# Patient Record
Sex: Female | Born: 1935
Health system: Southern US, Community
[De-identification: ages and names within clinical notes are randomized; demographics above are authoritative.]

## PROBLEM LIST (undated history)

## (undated) DIAGNOSIS — E7800 Pure hypercholesterolemia, unspecified: Secondary | ICD-10-CM

## (undated) DIAGNOSIS — D150 Benign neoplasm of thymus: Secondary | ICD-10-CM

## (undated) DIAGNOSIS — M1990 Unspecified osteoarthritis, unspecified site: Secondary | ICD-10-CM

## (undated) DIAGNOSIS — N3281 Overactive bladder: Secondary | ICD-10-CM

## (undated) DIAGNOSIS — I10 Essential (primary) hypertension: Secondary | ICD-10-CM

## (undated) DIAGNOSIS — I2510 Atherosclerotic heart disease of native coronary artery without angina pectoris: Principal | ICD-10-CM

## (undated) HISTORY — DX: Benign neoplasm of thymus: D15.0

## (undated) HISTORY — PX: TUBAL LIGATION: SHX77

## (undated) HISTORY — DX: Atherosclerotic heart disease of native coronary artery without angina pectoris: I25.10

## (undated) HISTORY — DX: Pure hypercholesterolemia, unspecified: E78.00

## (undated) HISTORY — PX: EYE SURGERY: SHX253

## (undated) HISTORY — DX: Overactive bladder: N32.81

## (undated) HISTORY — DX: Essential (primary) hypertension: I10

---

## 1984-02-14 HISTORY — PX: CERVICAL LAMINECTOMY: SHX94

## 1998-06-25 ENCOUNTER — Other Ambulatory Visit: Admission: RE | Admit: 1998-06-25 | Discharge: 1998-06-25 | Payer: Self-pay | Admitting: Obstetrics and Gynecology

## 1999-07-15 ENCOUNTER — Other Ambulatory Visit: Admission: RE | Admit: 1999-07-15 | Discharge: 1999-07-15 | Payer: Self-pay | Admitting: Obstetrics and Gynecology

## 2000-10-24 ENCOUNTER — Other Ambulatory Visit: Admission: RE | Admit: 2000-10-24 | Discharge: 2000-10-24 | Payer: Self-pay | Admitting: Obstetrics and Gynecology

## 2002-10-01 ENCOUNTER — Other Ambulatory Visit: Admission: RE | Admit: 2002-10-01 | Discharge: 2002-10-01 | Payer: Self-pay | Admitting: Obstetrics and Gynecology

## 2004-04-20 ENCOUNTER — Encounter: Admission: RE | Admit: 2004-04-20 | Discharge: 2004-04-20 | Payer: Self-pay | Admitting: Obstetrics and Gynecology

## 2005-06-20 ENCOUNTER — Encounter: Admission: RE | Admit: 2005-06-20 | Discharge: 2005-06-20 | Payer: Self-pay | Admitting: Obstetrics and Gynecology

## 2005-11-24 ENCOUNTER — Inpatient Hospital Stay (HOSPITAL_COMMUNITY): Admission: RE | Admit: 2005-11-24 | Discharge: 2005-11-28 | Payer: Self-pay | Admitting: Thoracic Surgery

## 2005-11-24 ENCOUNTER — Encounter (INDEPENDENT_AMBULATORY_CARE_PROVIDER_SITE_OTHER): Payer: Self-pay | Admitting: *Deleted

## 2005-11-24 DIAGNOSIS — D4989 Neoplasm of unspecified behavior of other specified sites: Secondary | ICD-10-CM

## 2005-11-24 HISTORY — PX: OTHER SURGICAL HISTORY: SHX169

## 2005-11-24 HISTORY — DX: Neoplasm of unspecified behavior of other specified sites: D49.89

## 2005-12-05 ENCOUNTER — Encounter: Admission: RE | Admit: 2005-12-05 | Discharge: 2005-12-05 | Payer: Self-pay | Admitting: Thoracic Surgery

## 2005-12-27 ENCOUNTER — Encounter: Admission: RE | Admit: 2005-12-27 | Discharge: 2005-12-27 | Payer: Self-pay | Admitting: Thoracic Surgery

## 2006-02-13 DIAGNOSIS — I2510 Atherosclerotic heart disease of native coronary artery without angina pectoris: Secondary | ICD-10-CM

## 2006-02-13 HISTORY — DX: Atherosclerotic heart disease of native coronary artery without angina pectoris: I25.10

## 2006-02-13 HISTORY — PX: CARDIAC CATHETERIZATION: SHX172

## 2006-02-20 ENCOUNTER — Encounter: Admission: RE | Admit: 2006-02-20 | Discharge: 2006-02-20 | Payer: Self-pay | Admitting: Thoracic Surgery

## 2006-05-30 ENCOUNTER — Ambulatory Visit: Payer: Self-pay | Admitting: Thoracic Surgery

## 2006-05-30 ENCOUNTER — Encounter: Admission: RE | Admit: 2006-05-30 | Discharge: 2006-05-30 | Payer: Self-pay | Admitting: Thoracic Surgery

## 2006-07-10 ENCOUNTER — Encounter: Admission: RE | Admit: 2006-07-10 | Discharge: 2006-07-10 | Payer: Self-pay | Admitting: Obstetrics and Gynecology

## 2006-08-29 ENCOUNTER — Encounter: Admission: RE | Admit: 2006-08-29 | Discharge: 2006-08-29 | Payer: Self-pay | Admitting: Thoracic Surgery

## 2006-08-29 ENCOUNTER — Ambulatory Visit: Payer: Self-pay | Admitting: Thoracic Surgery

## 2007-01-02 ENCOUNTER — Ambulatory Visit: Payer: Self-pay | Admitting: Thoracic Surgery

## 2007-01-02 ENCOUNTER — Encounter: Admission: RE | Admit: 2007-01-02 | Discharge: 2007-01-02 | Payer: Self-pay | Admitting: Thoracic Surgery

## 2007-07-03 ENCOUNTER — Ambulatory Visit: Payer: Self-pay | Admitting: Thoracic Surgery

## 2007-07-03 ENCOUNTER — Encounter: Admission: RE | Admit: 2007-07-03 | Discharge: 2007-07-03 | Payer: Self-pay | Admitting: Thoracic Surgery

## 2007-07-25 ENCOUNTER — Encounter: Admission: RE | Admit: 2007-07-25 | Discharge: 2007-07-25 | Payer: Self-pay | Admitting: Obstetrics and Gynecology

## 2008-01-15 ENCOUNTER — Encounter: Admission: RE | Admit: 2008-01-15 | Discharge: 2008-01-15 | Payer: Self-pay | Admitting: Thoracic Surgery

## 2008-01-15 ENCOUNTER — Ambulatory Visit: Payer: Self-pay | Admitting: Thoracic Surgery

## 2008-07-22 ENCOUNTER — Encounter: Admission: RE | Admit: 2008-07-22 | Discharge: 2008-07-22 | Payer: Self-pay | Admitting: Thoracic Surgery

## 2008-07-22 ENCOUNTER — Ambulatory Visit: Payer: Self-pay | Admitting: Thoracic Surgery

## 2008-10-27 ENCOUNTER — Encounter: Admission: RE | Admit: 2008-10-27 | Discharge: 2008-10-27 | Payer: Self-pay | Admitting: Obstetrics and Gynecology

## 2009-01-26 ENCOUNTER — Ambulatory Visit: Payer: Self-pay | Admitting: Thoracic Surgery

## 2009-01-26 ENCOUNTER — Encounter: Admission: RE | Admit: 2009-01-26 | Discharge: 2009-01-26 | Payer: Self-pay | Admitting: Thoracic Surgery

## 2009-12-17 ENCOUNTER — Encounter: Admission: RE | Admit: 2009-12-17 | Discharge: 2009-12-17 | Payer: Self-pay | Admitting: Obstetrics and Gynecology

## 2009-12-24 ENCOUNTER — Encounter: Admission: RE | Admit: 2009-12-24 | Discharge: 2009-12-24 | Payer: Self-pay | Admitting: Cardiology

## 2010-02-01 ENCOUNTER — Ambulatory Visit: Payer: Self-pay | Admitting: Thoracic Surgery

## 2010-03-05 ENCOUNTER — Encounter: Payer: Self-pay | Admitting: Thoracic Surgery

## 2010-03-06 ENCOUNTER — Encounter: Payer: Self-pay | Admitting: Thoracic Surgery

## 2010-06-28 NOTE — Assessment & Plan Note (Signed)
OFFICE VISIT   Belinda [name], Belinda [name]  DOB:  08/08/1934                                        August 29, 2006  CHART #:  [id number]   [name] came today. Her blood pressure is 142/77, pulse 52,  respirations 18, sats are 98%. She is now approximately 8 months since a  thymectomy with a partial median sternotomy.  Chest x-ray showed no  evidence of recurrence. Her wire is in good position. Lungs are clear to  auscultation and percussion. Sternum was stable. She is doing well  overall. We will see her again in 4 months with a CT scan of the chest,  which will be approximately 1 year since her surgery.   [name], M.D.  Electronically Signed   DPB/MEDQ  D:  08/29/2006  T:  08/30/2006  Job:  [id number]

## 2010-06-28 NOTE — Letter (Signed)
July 22, 2008   Aundra Dubin. Revankar, MD  [address]  [place], [place] [address]   Re:  BELA, BONAPARTE                 DOB:  23-Sep-1934   Dear Dr. Tomie China:   I saw the patient back today.  She is now 2-1/2 years since resection of  her thymoma.  Her blood pressure was 149/85, pulse 64, respirations 18,  and sats were 98%.  Chest x-ray showed no changes.  I will get her back  in 6 months and repeat her CT scan, and refer her back to you for  longterm followup.  I appreciate the opportunity of seeing the patient.   Sincerely,   [name], M.D.  Electronically Signed   DPB/MEDQ  D:  07/22/2008  T:  07/22/2008  Job:  [id number]

## 2010-06-28 NOTE — Assessment & Plan Note (Signed)
OFFICE VISIT   Belinda [name], Belinda [name]  DOB:  Mar 31, 1934                                        Jul 03, 2007  CHART #:  [id number]   HISTORY OF PRESENT ILLNESS:  This is a pleasant 76 year old Caucasian  female who is status post partial mediastinoscopy with thymectomy and  resection of thymic mass by Dr. Edwyna Shell on 11/24/05.  She was last seen  in the office on 01/02/07.  She had had a CAT scan at that time that  showed no evidence of recurrence of her thymoma.  The patient,  currently, is doing well overall.  Her only complaint is occasional pain  and discomfort at the incision site.  She also is fairly anxious about  learning the results of her chest x-ray today.   PHYSICAL EXAMINATION:  GENERAL:  This is a pleasant 76 year old  Caucasian female who is in no acute distress, is alert, oriented and  cooperative.  VITAL SIGNS:  Blood pressure 189/90, pulse rate 50, respirations 18, O2  sat 100% on room air.  CARDIOVASCULAR:  Regular rate and rhythm, S1, S2.  No murmurs, rubs or  gallops.  LUNGS:  Grossly clear to auscultation bilaterally.  No wheezing, rhonchi  or rales.  CHEST:  Incision is clean, dry, well healed.  Sternum is solid.  No  evidence of sternal instability.  Today's chest x-ray showed no evidence  of recurrence of her thymoma.  Lungs somewhat hyperaerated and some  cardiomegaly.   IMPRESSION AND PLAN:  The patient is going to return to Dr. Edwyna Shell in 6  months with a CAT scan of the chest.  She is instructed for any  questions, problems, concerns in the interim.   Doree Fudge, PA   DZ/MEDQ  D:  07/03/2007  T:  07/03/2007  Job:  [id number]   cc:   Aundra Dubin. Revankar, M.D.

## 2010-06-28 NOTE — Assessment & Plan Note (Signed)
OFFICE VISIT   [name], Belinda L  DOB:  24-May-1934                                        January 02, 2007  CHART #:  [id number]   Her blood pressure is 135/76, her pulse is 63 and respirations 18,  saturating 98%.   Lungs clear to auscultation and percussion.  Incision was stable.  CT  scan showed evidence of recurrence of her thymoma.   She is doing well overall and I will see her back again in 6 months with  a chest x-ray. She is over 1 year since her thymectomy.   [name], M.D.  Electronically Signed   DPB/MEDQ  D:  01/02/2007  T:  01/03/2007  Job:  [id number]

## 2010-06-28 NOTE — Assessment & Plan Note (Signed)
OFFICE VISIT   Belinda [name], Belinda [name]  DOB:  1934/07/18                                        February 01, 2010  CHART #:  [id number]   HISTORY:  The patient is a 77 year old female who is status post  resection of a thymic mass on November 24, 2005, by Dr. Edwyna Shell.  This did  turn out to be a thymoma.  Recently, she underwent a CT scan in followup  on December 24, 2009.  Reportedly, there found some benign-appearing  nodules, but the patient said her cardiologist was concerned and that we  should evaluate this study.  She is seen on today's date for that.   PHYSICAL EXAMINATION:  Vital Signs:  Blood pressure 154/80, pulse 62,  respirations 16, and oxygen saturation is 98% on room air.  General  Appearance:  Well-developed adult female, in no acute distress.  Pulmonary:  Clear lungs.  Cardiac:  Regular rate and rhythm.   CT scan was reviewed and it does reveal some benign-appearing right-  sided pulmonary nodules that are stable.  This study was reviewed by Dr.  Edwyna Shell.  There were no other additional findings and no evidence of  recurrence.   ASSESSMENT:  The patient is doing well from our viewpoint.  We will get  a repeat CT scan in 1 year.   Rowe Clack, P.A.-C.   Sherryll Burger  D:  02/01/2010  T:  02/02/2010  Job:  [id number]   cc:   Aundra Dubin. Revankar, M.D.

## 2010-06-28 NOTE — Letter (Signed)
January 26, 2009   Aundra Dubin. Revankar, MD  [address]  [place], [place] [address]   Re:  Belinda [name], Belinda [name]                 DOB:  December 26, 1934   Dear Dr. Tomie China:   I saw the patient back today.  She is now 3 years since resection of her  3.8-cm thymoma.  Chest CT scan shows no evidence of recurrence of her  cancer, and I will see her back again for any long-term follow up.  I  would suggest that she get a CT scan in approximately a year and then  another one in approximately 2 years, but her chances of recurrence are  very small and now over 3 years since her surgery.  Her blood pressure  is 132/75, pulse 60, respirations 18, sats were 98%.  I appreciate the  opportunity of taking care the patient.   Sincerely,   [name], M.D.  Electronically Signed   DPB/MEDQ  D:  01/26/2009  T:  01/27/2009  Job:  [id number]   cc:   Burnell Blanks, MD

## 2010-06-28 NOTE — Letter (Signed)
January 15, 2008   Aundra Dubin. Revankar, MD  [address]  [place], [place] [address]   Re:  Belinda [name], Belinda [name]                 DOB:  11/07/1934   Dear Dr. Tomie China:   I saw the patient in followup, now 2 years since we removed her thymoma.  There is no evidence of recurrence.  She does complain of having some  choking sensation and some trouble swallowing since her surgery, and she  also states that she is unable to sing, but she has no hoarseness.  She  does state that if she is having some symptoms, she will see an ENT  doctor.  The CT scan showed stable changes in the anterior mediastinum  with no evidence of recurrence of her thymoma.  We will see her back  again in 6 months with a chest x-ray.   [name], M.D.  Electronically Signed   DPB/MEDQ  D:  01/15/2008  T:  01/15/2008  Job:  [id number]

## 2010-07-01 NOTE — Op Note (Signed)
Belinda Brown, EMLEY NO.:  [id number]   MEDICAL RECORD NO.:  [id number]          PATIENT TYPE:  INP   LOCATION:  2899                         FACILITY:  [facility]   PHYSICIAN:  [name], M.D. DATE OF BIRTH:  07/16/1934   DATE OF PROCEDURE:  11/24/2005  DATE OF DISCHARGE:                                 OPERATIVE REPORT   PREOPERATIVE DIAGNOSIS:  Thymic mass.   POSTOPERATIVE DIAGNOSIS:  Probable thymoma.   OPERATION PERFORMED:  Partial mediastinoscopy with thymectomy and resection  of thymic mass.   SURGEON:  [name], M.D.   FIRST ASSISTANT:  Jerold Coombe, P.A.-C.   DESCRIPTION OF PROCEDURE:  After percutaneous insertion of all monitoring  lines, the patient underwent general anesthesia.  A 10-cm incision was made  from the sternal notch going down the midline.  The subcutaneous tissue was  divided with electrocautery.  The sternum was split with the sternal saw  down to the manubrium or the angle of Louis, and then Ostene hemostatic  agent was applied, as well as Gelfoam, and a lamina spreader was placed in  the split.  Dissection was started above the innominate vein, dissecting off  the left and right anterior horns of the thymus gland, and dissecting it off  the innominate vein.  One large vessel was doubly clipped and divided.  Then, we dissected down the right side, dissecting the thymus gland, which  appeared to be normal off the right pleura, making 1 small hole in the right  pleura.  We then turned our attention to the left side and dissected off the  pericardium, and then off the left pleura, and found the mass and dissected  the mass out completely and removed the thymus with the thymic mass  completely.  The frozen section revealed this was probably a lymphocyte-  predominant thymoma.  All bleeding was electrocoagulated.  A #28 chest tube  was placed substernally and sutured in place at the xiphoid with 0 silk.  The sternum was  closed with #5 wire in a twisted fashion, #1 Vicryl in the  muscle layer, 2-0 Vicryl in the subcutaneous tissue, and 3-0 Vicryl as a  subcuticular stitch, Dermabond for the skin.  The patient returned to the  recovery room in stable condition.           ______________________________  [name], M.D.     DPB/MEDQ  D:  11/24/2005  T:  11/24/2005  Job:  [id number]   cc:   Dorita Sciara, MD

## 2010-07-01 NOTE — Discharge Summary (Signed)
[name], [name] NO.:  [id number]   MEDICAL RECORD NO.:  [id number]          PATIENT TYPE:  INP   LOCATION:  2032                         FACILITY:  [facility]   PHYSICIAN:  [name], M.D. DATE OF BIRTH:  Jul 27, 1934   DATE OF ADMISSION:  11/24/2005  DATE OF DISCHARGE:                                 DISCHARGE SUMMARY   DATE OF BIRTH:  30-Sep-1934   PHYSICIAN:  Dr. Norton Blizzard   DATE OF ADMISSION:  November 24, 2005   ANTICIPATED DATE OF DISCHARGE:  November 28, 2005   ADMISSION DIAGNOSIS:  Thymic mass   DISCHARGE/SECONDARY DIAGNOSES:  1. Probable myoma, although pathology is still pending at the time of this      dictation, status post thymectomy.  2. Hypercholesterolemia.  3. Hypertension.  4. History of posterior cervical fusion.  5. ALLERGY TO CODEINE AND AMPICILLIN.   PROCEDURES:  November 24, 2005, partial median sternotomy, resection of  thymic mass by Dr. Algis Downs. Karle Plumber.   BRIEF HISTORY:  Belinda Brown is a 77 year old Caucasian female who initially  presented with chest pain and had a Cardiolite scan, which showed increased  uptake in the upper portion of her right chest.  Cardiolite scan was  negative.  She subsequently underwent a CT scan, which revealed a 3.3 x 2.2  cm anterior mediastinal mass consistent with thymoma, or possible lymphoma.  Pulmonary function test showed an FVC of 2.17 with an FEV1 of 1.5.  She  continued to have occasional chest pain and has some mild generalized  weakness, but is otherwise asymptomatic.  She has had a history of  myasthenia gravis.  She was referred to Dr. Edwyna Shell for further evaluation  and treatment of her thymic mass and he recommended sternotomy for  thymectomy.   HOSPITAL COURSE:  Belinda Brown was electively admitted to [facility]  on November 24, 2005, for a thymectomy, as discussed previously.  She was  extubated neurologically intact and transferred to a stepdown unit 3300  postoperatively.  At the time of this dictation, she has had an uneventful  postoperative course.  Her mediastinal tube was discontinued by  postoperative day 2.  Once her tube was out, her morphine PCA was  discontinued and her pain has been controlled on Darvocet and Ultram p.r.n.  She has remained hemodynamically stable with last vital signs showing blood  pressure of 145/75, heart rate in sinus rhythm around 60.  She has been  overall afebrile with intermittent temperatures up to mid 99.  She has been  saturating 95% on room air.  Her latest postoperative chest x-ray has shown  mild bibasilar atelectasis with a tiny left apical pneumothorax.  She has  been voiding without difficulty following removal of Foley catheter and  bowel function is returning.  She has been ambulating without difficulty.  Her incisions appear to be healing well without signs of infection.  Her  labs have also remained stable.  Most recently, on November 26, 2005, showed  a white blood count of 6.6, hemoglobin 10.5, hematocrit 30.3, platelet  count  133, sodium of 132, potassium 4.2, chloride 99, CO2 29, blood glucose 127,  BUN 6, creatinine 0.7, total bilirubin 0.9, alkaline phosphatase 40, AST 50,  ALT 13, total protein 5.1, blood albumin 2.9, calcium 8.4.  Again, her  thymic mass pathology is still pending, but preliminary reports felt that  this was most likely a thymoma rather than lymphoma.  If pathology reports  are known prior to discharge, will discuss oncology followup if appropriate  at that time.   If Belinda Brown continues to make steady progress within the next 24 hours, it  is anticipated that she will be ready for discharge home on postoperative  day 4, November 28, 2005.   DISCHARGE MEDICATIONS:  1. Darvocet-N 100 one to two tablets p.o. q.4 hours p.r.n. pain, maximum 8      tablets per day.  2. Atenolol 25 mg 1 p.o. q. day.  3. NitroQuick 0.4 mg sublingual p.r.n.  4. TriCor 145 mg p.o. daily.   5. Fish oil capsules 1200 mg 2 p.o. daily.  6. Centrum Silver 1 p.o. q. day.  7. Aspirin 81 mg p.o. q. day.   DISCHARGE INSTRUCTIONS:  She is to resume her preoperative diet.  She is to  increase her activity slowly and she is to continue daily walking and  breathing exercises.  She is to avoid driving or heavy lifting for the next  4 weeks.  She may shower and clean her incision gently with soap and water.  She is to notify the [facility] office if she develops fever greater than 101 or  redness or draining from her incision sites.   FOLLOWUP:  She is to follow up with Dr. Edwyna Shell at the [facility] office in 7-10  days with a chest x-ray 1 hour before her appointment at the [facility].      Jerold Coombe, P.A.    ______________________________  [name], M.D.    AWZ/MEDQ  D:  11/27/2005  T:  11/28/2005  Job:  [id number]   cc:   Charline Bills, MD

## 2010-07-01 NOTE — H&P (Signed)
Belinda Brown, DICKERSON NO.:  [id number]   MEDICAL RECORD NO.:  [id number]          PATIENT TYPE:  INP   LOCATION:  NA                           FACILITY:  [facility]   PHYSICIAN:  [name], M.D. DATE OF BIRTH:  1934-08-16   DATE OF ADMISSION:  DATE OF DISCHARGE:                                HISTORY & PHYSICAL   DATE OF ADMISSION:  November 24, 2005   CHIEF COMPLAINT:  Thymic mass.   HISTORY OF PRESENT ILLNESS:  This 76 year old Caucasian female was having a  chest pain and had a Cardiolite scan which showed increased uptake in the  upper portion of her right chest and this was negative.  She had a CT scan  which revealed a 3.3 x 2.2 anterior mediastinal mass consistent with thymoma  or possible lymphoma.  Pulmonary function tests show an FVC of 2.17 with an  FEV1 of 1.5.  She has occasional chest pain but no other symptoms.  No  problems with weakness or other symptoms of possible myasthenia gravis.   PAST MEDICAL HISTORY:  Codeine causes her to be nervous.  She is on atenolol  12.5 daily and TriCor 30 mg daily for hypercholesterolemia and hypertension.   FAMILY HISTORY:  Noncontributory.   SOCIAL HISTORY:  She is married, has one child.  Does not smoke, does not  drink alcohol on a regular basis.   REVIEW OF SYSTEMS:  She is 145 pounds.  She is 5 feet 5 inches. CARDIAC:  See history of present illness.  She gets shortness of breath with exertion.  PULMONARY:  No bronchitis, hemoptysis, asthma, excessive sputum.  GI:  No  nausea, vomiting, constipation, diarrhea or reflux.  GU:  No kidney disease,  dysuria or frequent urination.  VASCULAR:  No claudication, DVT or TIAs.  NEUROLOGICAL:  No headaches, blackouts or seizures.  MUSCULOSKELETAL:  She  has mild joint pain.  PSYCHIATRIC:  No psychiatric illness.  INT:  No change  in her eyesight or hearing.  HEMATOLOGICAL:  No problems with bleeding or  clotting disorders.   PHYSICAL EXAMINATION:  She is a  well-developed Caucasian female in no acute  distress.  Blood pressure is 160/80, pulse 52, respirations 18, sats are  98%.  Head is atraumatic.  EYES:  Pupils equal, reactive to light and accommodation.  Extraocular  movements are normal.  EARS:  Tympanic membranes intact.  NOSE:  No septal deviation.  THROAT:  Without lesion.  NECK:  Supple without thyromegaly.  There are no carotid bruits.  No  supraclavicular or axillary adenopathy.  CHEST:  Clear to auscultation and percussion.  HEART:  Regular sinus rhythm.  No murmurs.  ABDOMEN:  Soft.  There is no hepatosplenomegaly.  Bowel sounds are normal.  EXTREMITIES:  Pulses 2+.  There is no clubbing or edema.  NEUROLOGICAL:  She is oriented x3.  Sensory and motor intact.  Cranial  nerves are intact.  SKIN:  Without lesion.   IMPRESSION:  Thymic mass, rule out thymoma.   PLAN:  Sternotomy with thymectomy.  ______________________________  [name], M.D.     DPB/MEDQ  D:  11/22/2005  T:  11/22/2005  Job:  [id number]

## 2010-12-28 ENCOUNTER — Other Ambulatory Visit: Payer: Self-pay | Admitting: Thoracic Surgery

## 2010-12-28 DIAGNOSIS — D381 Neoplasm of uncertain behavior of trachea, bronchus and lung: Secondary | ICD-10-CM

## 2011-01-13 ENCOUNTER — Encounter: Payer: Self-pay | Admitting: Thoracic Surgery

## 2011-01-13 DIAGNOSIS — E7800 Pure hypercholesterolemia, unspecified: Secondary | ICD-10-CM | POA: Insufficient documentation

## 2011-01-13 DIAGNOSIS — I10 Essential (primary) hypertension: Secondary | ICD-10-CM | POA: Insufficient documentation

## 2011-01-13 DIAGNOSIS — I2510 Atherosclerotic heart disease of native coronary artery without angina pectoris: Secondary | ICD-10-CM | POA: Insufficient documentation

## 2011-01-23 DIAGNOSIS — D150 Benign neoplasm of thymus: Secondary | ICD-10-CM

## 2011-01-23 DIAGNOSIS — D4989 Neoplasm of unspecified behavior of other specified sites: Secondary | ICD-10-CM | POA: Insufficient documentation

## 2011-01-24 ENCOUNTER — Ambulatory Visit (INDEPENDENT_AMBULATORY_CARE_PROVIDER_SITE_OTHER): Payer: Self-pay | Admitting: Thoracic Surgery

## 2011-01-24 ENCOUNTER — Encounter: Payer: Self-pay | Admitting: Thoracic Surgery

## 2011-01-24 ENCOUNTER — Ambulatory Visit
Admission: RE | Admit: 2011-01-24 | Discharge: 2011-01-24 | Disposition: A | Discharge status: Home / Self Care | Payer: PRIVATE HEALTH INSURANCE | Source: Ambulatory Visit | Attending: Thoracic Surgery | Admitting: Thoracic Surgery

## 2011-01-24 VITALS — BP 142/79 | HR 55 | Resp 16 | Ht 64.5 in | Wt 141.0 lb

## 2011-01-24 DIAGNOSIS — C37 Malignant neoplasm of thymus: Secondary | ICD-10-CM

## 2011-01-24 DIAGNOSIS — D381 Neoplasm of uncertain behavior of trachea, bronchus and lung: Secondary | ICD-10-CM

## 2011-01-24 NOTE — Progress Notes (Signed)
HPI patient CT scan shows no evidence of recurrence of her thymoma. She is now 5 years since her surgery. Will release him back to her primary care physician.   Current Outpatient Prescriptions  Medication Sig Dispense Refill  . aspirin 81 MG tablet Take 81 mg by mouth daily.        Marland Kitchen atenolol (TENORMIN) 25 MG tablet Take 12.5 mg by mouth daily.       . Calcium Carbonate-Vitamin D (CALCIUM-VITAMIN D) 500-200 MG-UNIT per tablet Take 1 tablet by mouth 2 (two) times daily with a meal.        . fish oil-omega-3 fatty acids 1000 MG capsule Take 2 g by mouth daily.        . vitamin C (ASCORBIC ACID) 500 MG tablet Take 500 mg by mouth daily.        Marland Kitchen atorvastatin (LIPITOR) 20 MG tablet Take 20 mg by mouth daily.        . Vitamin D, Ergocalciferol, (DRISDOL) 50000 UNITS CAPS Take 50,000 Units by mouth.           Review of Systems: Some mild liver dysfunction followup by primary care physician   Physical Exam lungs are clear attestation percussion see   Diagnostic Tests: CT scan showed no evidence of recurrence of her thymoma   Impression status post resection thymoma 5 years ago:   Plan: Return as needed

## 2011-05-17 ENCOUNTER — Other Ambulatory Visit: Payer: Self-pay | Admitting: Obstetrics and Gynecology

## 2011-05-17 DIAGNOSIS — Z1231 Encounter for screening mammogram for malignant neoplasm of breast: Secondary | ICD-10-CM

## 2011-06-01 ENCOUNTER — Ambulatory Visit
Admission: RE | Admit: 2011-06-01 | Discharge: 2011-06-01 | Disposition: A | Discharge status: Home / Self Care | Payer: Medicare Other | Source: Ambulatory Visit | Attending: Obstetrics and Gynecology | Admitting: Obstetrics and Gynecology

## 2011-06-01 DIAGNOSIS — Z1231 Encounter for screening mammogram for malignant neoplasm of breast: Secondary | ICD-10-CM

## 2013-05-13 ENCOUNTER — Other Ambulatory Visit: Payer: Self-pay | Admitting: Family Medicine

## 2013-05-13 DIAGNOSIS — N644 Mastodynia: Secondary | ICD-10-CM

## 2013-05-21 ENCOUNTER — Ambulatory Visit
Admission: RE | Admit: 2013-05-21 | Discharge: 2013-05-21 | Disposition: A | Discharge status: Home / Self Care | Payer: Medicare Other | Source: Ambulatory Visit

## 2013-05-21 ENCOUNTER — Ambulatory Visit
Admission: RE | Admit: 2013-05-21 | Discharge: 2013-05-21 | Disposition: A | Discharge status: Home / Self Care | Payer: Medicare Other | Source: Ambulatory Visit | Attending: Family Medicine | Admitting: Family Medicine

## 2013-05-21 ENCOUNTER — Other Ambulatory Visit: Payer: Medicare Other

## 2013-05-21 ENCOUNTER — Other Ambulatory Visit: Payer: Self-pay

## 2013-05-21 DIAGNOSIS — Z1231 Encounter for screening mammogram for malignant neoplasm of breast: Secondary | ICD-10-CM

## 2013-05-21 DIAGNOSIS — N644 Mastodynia: Secondary | ICD-10-CM

## 2014-02-13 HISTORY — PX: OTHER SURGICAL HISTORY: SHX169

## 2014-12-29 DIAGNOSIS — Z23 Encounter for immunization: Secondary | ICD-10-CM | POA: Diagnosis not present

## 2015-01-27 DIAGNOSIS — I25118 Atherosclerotic heart disease of native coronary artery with other forms of angina pectoris: Secondary | ICD-10-CM | POA: Diagnosis not present

## 2015-01-27 DIAGNOSIS — I2510 Atherosclerotic heart disease of native coronary artery without angina pectoris: Secondary | ICD-10-CM | POA: Diagnosis not present

## 2015-01-27 DIAGNOSIS — I10 Essential (primary) hypertension: Secondary | ICD-10-CM | POA: Diagnosis not present

## 2015-01-27 DIAGNOSIS — E785 Hyperlipidemia, unspecified: Secondary | ICD-10-CM | POA: Diagnosis not present

## 2015-02-02 DIAGNOSIS — I10 Essential (primary) hypertension: Secondary | ICD-10-CM | POA: Diagnosis not present

## 2015-02-02 DIAGNOSIS — I2510 Atherosclerotic heart disease of native coronary artery without angina pectoris: Secondary | ICD-10-CM | POA: Diagnosis not present

## 2015-02-02 DIAGNOSIS — E782 Mixed hyperlipidemia: Secondary | ICD-10-CM | POA: Diagnosis not present

## 2015-02-09 DIAGNOSIS — K449 Diaphragmatic hernia without obstruction or gangrene: Secondary | ICD-10-CM | POA: Diagnosis not present

## 2015-02-09 DIAGNOSIS — Z87898 Personal history of other specified conditions: Secondary | ICD-10-CM | POA: Diagnosis not present

## 2015-02-09 DIAGNOSIS — R918 Other nonspecific abnormal finding of lung field: Secondary | ICD-10-CM | POA: Diagnosis not present

## 2015-02-09 DIAGNOSIS — R0789 Other chest pain: Secondary | ICD-10-CM | POA: Diagnosis not present

## 2015-03-01 DIAGNOSIS — Z6825 Body mass index (BMI) 25.0-25.9, adult: Secondary | ICD-10-CM | POA: Diagnosis not present

## 2015-03-01 DIAGNOSIS — R5383 Other fatigue: Secondary | ICD-10-CM | POA: Diagnosis not present

## 2015-03-01 DIAGNOSIS — K219 Gastro-esophageal reflux disease without esophagitis: Secondary | ICD-10-CM | POA: Diagnosis not present

## 2015-03-01 DIAGNOSIS — M25512 Pain in left shoulder: Secondary | ICD-10-CM | POA: Diagnosis not present

## 2015-03-01 DIAGNOSIS — R0681 Apnea, not elsewhere classified: Secondary | ICD-10-CM | POA: Diagnosis not present

## 2015-03-01 DIAGNOSIS — E538 Deficiency of other specified B group vitamins: Secondary | ICD-10-CM | POA: Diagnosis not present

## 2015-03-01 DIAGNOSIS — E559 Vitamin D deficiency, unspecified: Secondary | ICD-10-CM | POA: Diagnosis not present

## 2015-03-01 DIAGNOSIS — Z1389 Encounter for screening for other disorder: Secondary | ICD-10-CM | POA: Diagnosis not present

## 2015-03-02 DIAGNOSIS — M25512 Pain in left shoulder: Secondary | ICD-10-CM | POA: Diagnosis not present

## 2015-03-03 DIAGNOSIS — R69 Illness, unspecified: Secondary | ICD-10-CM | POA: Diagnosis not present

## 2015-03-30 DIAGNOSIS — M25512 Pain in left shoulder: Secondary | ICD-10-CM | POA: Diagnosis not present

## 2015-03-30 DIAGNOSIS — N390 Urinary tract infection, site not specified: Secondary | ICD-10-CM | POA: Diagnosis not present

## 2015-03-30 DIAGNOSIS — Z6824 Body mass index (BMI) 24.0-24.9, adult: Secondary | ICD-10-CM | POA: Diagnosis not present

## 2015-03-30 DIAGNOSIS — Z1389 Encounter for screening for other disorder: Secondary | ICD-10-CM | POA: Diagnosis not present

## 2015-05-12 DIAGNOSIS — Z6824 Body mass index (BMI) 24.0-24.9, adult: Secondary | ICD-10-CM | POA: Diagnosis not present

## 2015-05-12 DIAGNOSIS — J069 Acute upper respiratory infection, unspecified: Secondary | ICD-10-CM | POA: Diagnosis not present

## 2015-05-12 DIAGNOSIS — M25512 Pain in left shoulder: Secondary | ICD-10-CM | POA: Diagnosis not present

## 2015-06-30 DIAGNOSIS — D1801 Hemangioma of skin and subcutaneous tissue: Secondary | ICD-10-CM | POA: Diagnosis not present

## 2015-06-30 DIAGNOSIS — D225 Melanocytic nevi of trunk: Secondary | ICD-10-CM | POA: Diagnosis not present

## 2015-06-30 DIAGNOSIS — L821 Other seborrheic keratosis: Secondary | ICD-10-CM | POA: Diagnosis not present

## 2015-06-30 DIAGNOSIS — L578 Other skin changes due to chronic exposure to nonionizing radiation: Secondary | ICD-10-CM | POA: Diagnosis not present

## 2015-08-04 DIAGNOSIS — E559 Vitamin D deficiency, unspecified: Secondary | ICD-10-CM | POA: Diagnosis not present

## 2015-08-04 DIAGNOSIS — M545 Low back pain: Secondary | ICD-10-CM | POA: Diagnosis not present

## 2015-08-04 DIAGNOSIS — Z6824 Body mass index (BMI) 24.0-24.9, adult: Secondary | ICD-10-CM | POA: Diagnosis not present

## 2015-08-04 DIAGNOSIS — E785 Hyperlipidemia, unspecified: Secondary | ICD-10-CM | POA: Diagnosis not present

## 2015-08-11 DIAGNOSIS — E785 Hyperlipidemia, unspecified: Secondary | ICD-10-CM | POA: Diagnosis not present

## 2015-08-11 DIAGNOSIS — E559 Vitamin D deficiency, unspecified: Secondary | ICD-10-CM | POA: Diagnosis not present

## 2015-10-27 DIAGNOSIS — L720 Epidermal cyst: Secondary | ICD-10-CM | POA: Diagnosis not present

## 2015-10-27 DIAGNOSIS — L570 Actinic keratosis: Secondary | ICD-10-CM | POA: Diagnosis not present

## 2016-01-03 DIAGNOSIS — H524 Presbyopia: Secondary | ICD-10-CM | POA: Diagnosis not present

## 2016-02-15 DIAGNOSIS — I10 Essential (primary) hypertension: Secondary | ICD-10-CM | POA: Diagnosis not present

## 2016-02-15 DIAGNOSIS — E538 Deficiency of other specified B group vitamins: Secondary | ICD-10-CM | POA: Diagnosis not present

## 2016-02-15 DIAGNOSIS — E785 Hyperlipidemia, unspecified: Secondary | ICD-10-CM | POA: Diagnosis not present

## 2016-02-15 DIAGNOSIS — E559 Vitamin D deficiency, unspecified: Secondary | ICD-10-CM | POA: Diagnosis not present

## 2016-02-17 DIAGNOSIS — E559 Vitamin D deficiency, unspecified: Secondary | ICD-10-CM | POA: Diagnosis not present

## 2016-02-17 DIAGNOSIS — E785 Hyperlipidemia, unspecified: Secondary | ICD-10-CM | POA: Diagnosis not present

## 2016-02-17 DIAGNOSIS — E538 Deficiency of other specified B group vitamins: Secondary | ICD-10-CM | POA: Diagnosis not present

## 2016-02-17 DIAGNOSIS — I2510 Atherosclerotic heart disease of native coronary artery without angina pectoris: Secondary | ICD-10-CM | POA: Diagnosis not present

## 2016-02-17 DIAGNOSIS — N760 Acute vaginitis: Secondary | ICD-10-CM | POA: Diagnosis not present

## 2016-02-17 DIAGNOSIS — R350 Frequency of micturition: Secondary | ICD-10-CM | POA: Diagnosis not present

## 2016-02-17 DIAGNOSIS — K219 Gastro-esophageal reflux disease without esophagitis: Secondary | ICD-10-CM | POA: Diagnosis not present

## 2016-02-17 DIAGNOSIS — I10 Essential (primary) hypertension: Secondary | ICD-10-CM | POA: Diagnosis not present

## 2016-02-22 DIAGNOSIS — Z6824 Body mass index (BMI) 24.0-24.9, adult: Secondary | ICD-10-CM | POA: Diagnosis not present

## 2016-02-22 DIAGNOSIS — N7689 Other specified inflammation of vagina and vulva: Secondary | ICD-10-CM | POA: Diagnosis not present

## 2016-02-23 DIAGNOSIS — Z6824 Body mass index (BMI) 24.0-24.9, adult: Secondary | ICD-10-CM | POA: Diagnosis not present

## 2016-02-23 DIAGNOSIS — N760 Acute vaginitis: Secondary | ICD-10-CM | POA: Diagnosis not present

## 2016-02-24 DIAGNOSIS — N760 Acute vaginitis: Secondary | ICD-10-CM | POA: Diagnosis not present

## 2016-03-17 DIAGNOSIS — Z6824 Body mass index (BMI) 24.0-24.9, adult: Secondary | ICD-10-CM | POA: Diagnosis not present

## 2016-03-17 DIAGNOSIS — J069 Acute upper respiratory infection, unspecified: Secondary | ICD-10-CM | POA: Diagnosis not present

## 2016-03-17 DIAGNOSIS — R05 Cough: Secondary | ICD-10-CM | POA: Diagnosis not present

## 2016-04-18 DIAGNOSIS — R69 Illness, unspecified: Secondary | ICD-10-CM | POA: Diagnosis not present

## 2016-05-04 DIAGNOSIS — Z6824 Body mass index (BMI) 24.0-24.9, adult: Secondary | ICD-10-CM | POA: Diagnosis not present

## 2016-05-04 DIAGNOSIS — J069 Acute upper respiratory infection, unspecified: Secondary | ICD-10-CM | POA: Diagnosis not present

## 2016-06-01 DIAGNOSIS — R0789 Other chest pain: Secondary | ICD-10-CM | POA: Diagnosis not present

## 2016-06-01 DIAGNOSIS — Z6824 Body mass index (BMI) 24.0-24.9, adult: Secondary | ICD-10-CM | POA: Diagnosis not present

## 2016-06-01 DIAGNOSIS — R42 Dizziness and giddiness: Secondary | ICD-10-CM | POA: Diagnosis not present

## 2016-06-02 ENCOUNTER — Encounter: Payer: Self-pay | Admitting: Cardiology

## 2016-06-02 ENCOUNTER — Ambulatory Visit (INDEPENDENT_AMBULATORY_CARE_PROVIDER_SITE_OTHER): Payer: Medicare HMO | Admitting: Cardiology

## 2016-06-02 VITALS — BP 153/89 | HR 72 | Ht 65.5 in | Wt 145.0 lb

## 2016-06-02 DIAGNOSIS — R0789 Other chest pain: Secondary | ICD-10-CM | POA: Diagnosis not present

## 2016-06-02 DIAGNOSIS — R0609 Other forms of dyspnea: Secondary | ICD-10-CM | POA: Insufficient documentation

## 2016-06-02 DIAGNOSIS — I2510 Atherosclerotic heart disease of native coronary artery without angina pectoris: Secondary | ICD-10-CM | POA: Diagnosis not present

## 2016-06-02 DIAGNOSIS — R55 Syncope and collapse: Secondary | ICD-10-CM | POA: Diagnosis not present

## 2016-06-02 DIAGNOSIS — Z87898 Personal history of other specified conditions: Secondary | ICD-10-CM

## 2016-06-02 DIAGNOSIS — R5382 Chronic fatigue, unspecified: Secondary | ICD-10-CM

## 2016-06-02 DIAGNOSIS — Z86018 Personal history of other benign neoplasm: Secondary | ICD-10-CM | POA: Diagnosis not present

## 2016-06-02 DIAGNOSIS — R5383 Other fatigue: Secondary | ICD-10-CM | POA: Insufficient documentation

## 2016-06-02 DIAGNOSIS — I10 Essential (primary) hypertension: Secondary | ICD-10-CM

## 2016-06-02 DIAGNOSIS — R0600 Dyspnea, unspecified: Secondary | ICD-10-CM

## 2016-06-02 NOTE — Patient Instructions (Addendum)
Your physician has requested that you have an echocardiogram @ [address] - [address]. Echocardiography is a painless test that uses sound waves to create images of your heart. It provides your doctor with information about the size and shape of your heart and how well your heart's chambers and valves are working. This procedure takes approximately one hour. There are no restrictions for this procedure.  Dr. Ellyn Hack has ordered a coronary CTA    Your physician recommends that you schedule a follow-up appointment with Dr. Ellyn Hack after your testing is complete.

## 2016-06-02 NOTE — Progress Notes (Signed)
PCP: Christianne Dolin, [place]  - Phone number [phone], fax [phone]  Clinic Note: Chief Complaint  Patient presents with  . New Patient (Initial Visit)  . Chest Pain    tightness; occasionally.    HPI: Belinda Brown is a 82 y.o. female who is being seen today for the evaluation of Dizziness, near syncope as well as chest discomfort at the request of [place], Lorin Mercy, [place], FNP Visit she is a very pleasant elderly woman who has been followed by Dr. Lennox Pippins for over 10 years for her mild nonobstructive coronary disease. She requested transfer a cardiology care to [facility] in order to be in the [place]. She had a heart catheterization 2008 which showed roughly 50% disease (unfortunately do not have the Results. She also had a stress echocardiogram done in 2016 that was presumably normal. She is has a history of thymoma, and there was plans for her to have a CT scan of the chest done to assess her potential thymoma issues as well as potential hilar hernia.  Tayonna L Carrol was seen on April 19 by Christianne Dolin, FNP with the complaint of fatigue, chest discomfort and dizziness. She is now referred for establishing a new cardiologist.  Her Atenolol has been discontinued 2/2 concerns over fatigue.  Recent Hospitalizations: None  Studies Reviewed: unable to Findings Report or stress echo report.  Interval History: Samaya presents today mostly noting dizziness and near-syncopal symptoms. Feels as though she may pass out, but has not passed out. Consciousness. But when her symptoms abate, she just feels extremely tired. She describes a little weakness and unsteadiness usually while standing. It gets better when she rests. This happens with increasing frequency now and she had at least 2 episodes this week. She describes a sense of loss of balance and then she begins to feel like she may faint. She has not noticed any rapid irregular  heartbeats associated with it. As long as she sits down and rests, this is relieved. Another concerning symptom for her is that she has been having episodes of chest discomfort and pressure that everyone off for several weeks now. She's had mostly resting symptoms, but there has been some exacerbation with exertion. She is also noted some shortness of breath with both rest and exertion. A lot of times this discomfort associated with some nausea, but not always. General she is feels tired and fatigued but it isn't gone for a while now. She thinks it may be partly related to the fact that she was just treated for pneumonia back in March, and she is not fully recovered. Pre-much since that episode she has not felt well.   She denies any heart failure symptoms of PND, orthopnea or edema.   No TIA/amaurosis fugax symptoms.  No claudication.  ROS: A comprehensive was performed.Pertinent positives and negatives noted above.  Review of Systems  Constitutional: Positive for malaise/fatigue. Negative for weight loss.  HENT: Positive for congestion and ear pain.   Respiratory: Positive for cough (Pretty much clearing up from her bout with pneumonia) and shortness of breath.   Gastrointestinal: Negative for blood in stool and melena.  Genitourinary: Negative for hematuria.  Neurological: Positive for headaches. Dizziness: Per history of present illness. Also noted is some vertigo symptoms.  Endo/Heme/Allergies: Negative for environmental allergies.  Psychiatric/Behavioral: Negative for memory loss. The patient is not nervous/anxious and does not have insomnia.   All other systems reviewed and are  negative.    Past Medical History:  Diagnosis Date  . Hypercholesteremia   . Hypertension   . Nonobstructive atherosclerosis of coronary artery 2008   Nonobstructive disease by cath  . Thymoma 11/24/2005   s/p resection of thymic mass    Past Surgical History:  Procedure Laterality Date  . CARDIAC  CATHETERIZATION  2008   [facility]. - Dr. Geraldo Pitter -- non-obstructive CAD (by pt report - ~50% single vessel)  . partial mediastinoscopy with thymectomy and resection of thymic mass  11/24/2005   [place]  . Treadmill Stress Echo  2016   [facility]: report not avaialble.  Pt reports - Normal result   NOT TAKING ATENOLOL Current Meds  Medication Sig  . aspirin 81 MG tablet Take 81 mg by mouth daily.    Marland Kitchen atorvastatin (LIPITOR) 20 MG tablet Take 20 mg by mouth daily.    . B Complex-Biotin-FA (B COMPLETE PO) Take by mouth.  . Cholecalciferol (VITAMIN D3) 1000 units CAPS Take by mouth.  . fish oil-omega-3 fatty acids 1000 MG capsule Take 2 g by mouth daily.    Marland Kitchen losartan (COZAAR) 25 MG tablet Take 12.5 mg by mouth daily.  . Probiotic Product (PROBIOTIC DAILY PO) Take by mouth.  . rosuvastatin (CRESTOR) 10 MG tablet Take 1/2 tablet Daily  . vitamin C (ASCORBIC ACID) 500 MG tablet Take 500 mg by mouth daily.    . Vitamin D, Ergocalciferol, (DRISDOL) 50000 UNITS CAPS Take 50,000 Units by mouth.      Allergies  Allergen Reactions  . Ampicillin Hives  . Codeine Other (See Comments)    hallucinations    Social History   Social History  . Marital status: Married    Spouse name: N/A  . Number of children: N/A  . Years of education: N/A   Social History Main Topics  . Smoking status: Never Smoker  . Smokeless tobacco: Never Used  . Alcohol use Yes     Comment: not on a regular basis  . Drug use: No  . Sexual activity: Not Asked   Other Topics Concern  . None   Social History Narrative  . None    family history includes COPD in her brother; Cancer in her sister; Congestive Heart Failure in her mother; Heart attack in her father; Heart failure in her maternal grandmother, paternal grandfather, and paternal grandmother; Pneumonia in her maternal grandfather.  Wt Readings from Last 3 Encounters:  06/02/16 145 lb (65.8 kg)  01/24/11 141 lb (64 kg)    PHYSICAL EXAM BP  (!) 153/89   Pulse 72   Ht 5' 5.5" (1.664 m)   Wt 145 lb (65.8 kg)   BMI 23.76 kg/m  General appearance: alert, cooperative, appears stated age, no distress and Essentially healthy appearing. Neck: Shotty adenopathy, no carotid bruit and no JVD;  Lungs: clear to auscultation bilaterally, normal percussion bilaterally and non-labored Heart: regular rate and rhythm, S1& S2 normal, no murmur, click, rub or gallop; nondisplaced PMI  Abdomen: soft, non-tender; bowel sounds normal; no masses,  no organomegaly; no HJR  Extremities: extremities normal, atraumatic, no cyanosis, and edema - trivial; Pulses: 2+ and symmetric;  Skin: mobility and turgor normal, no edema, no evidence of bleeding or bruising, no lesions noted, temperature normal and texture normal or  Neurologic: Mental status: Alert &oriented x 3, thought content appropriate; pleasant mood and affect  Cranial nerves: normal (II-XII grossly intact)    Adult ECG Report - Not checked here today. From PCP  office on 06/01/2016:  Rate: 59 ;  Rhythm: sinus bradycardia and X deviation/borderline LAFB (axis -45). Nonspecific ST and T-wave changes with flat T waves in lateral leads.;   Narrative Interpretation: Relatively normal EKG   Other studies Reviewed: Additional studies/ records that were reviewed today include:  Recent Labs:  Per PCPJanuary 2010: - CBC: W4.2, H/H 0.9/41.3. Platelets 178.   TC 156, TG 90, HDL 50, LDL 80.   Sodium 140, potassium 4.2, chloride 100, bicarbonate 28, BUN 14, creatinine 0.78, glucose 102, calcium 9.1. AST/ALT 50/13, alkaline phosphatase 64. Total protein 6.3, abdomen 4.2.   ASSESSMENT / PLAN: Problem List Items Addressed This Visit    Atypical chest pain    Summary chest pain symptoms sound anginal in nature while others don't. It is not always there and only occurs on some occasions with different levels of exertion. However she does have evidence of coronary disease from previous evaluations.    Name: [facility] CTA with CT FFR.      DOE (dyspnea on exertion)    Exertional dyspnea with associated chest discomfort. Need to exclude cardiac etiology. Plan: Check 2-D echocardiogram as well as a coronary CTA with CT FFR.      Relevant Orders   ECHOCARDIOGRAM COMPLETE   CT CORONARY MORPH W/CTA COR W/SCORE W/CA W/CM &/OR WO/CM   CT CORONARY FRACTIONAL FLOW RESERVE DATA PREP   CT CORONARY FRACTIONAL FLOW RESERVE FLUID ANALYSIS   Fatigue   Relevant Orders   ECHOCARDIOGRAM COMPLETE   H/O thymoma (Chronic)    There've been plans in the past to reassess that with a chest CT. Since were not concerned about potential anginal symptoms I'm checking a coronary CTA which will allow [place] to evaluate for both issues.      Relevant Orders   ECHOCARDIOGRAM COMPLETE   CT CORONARY MORPH W/CTA COR W/SCORE W/CA W/CM &/OR WO/CM   CT CORONARY FRACTIONAL FLOW RESERVE DATA PREP   CT CORONARY FRACTIONAL FLOW RESERVE FLUID ANALYSIS   Hypertension    She is little hypertensive today having stopped her atenolol. This point with her fatigue and dizziness spells, I would prefer to keep her blood pressures slightly higher, until we know what her echo shows.      Relevant Medications   rosuvastatin (CRESTOR) 10 MG tablet   losartan (COZAAR) 25 MG tablet   Near syncope    Interesting symptoms of dizziness and lightheadedness. Check a 2-D echocardiogram and a coronary CTA. Doesn't sound like she is having arrhythmias. It actually seems more related to blood pressure changes. For now I would like to keep her with some prison hypertension. We talked about adequate hydration and eating.  Likely she has not had any true syncope. We can reassess this once we have the results of her studies.      Relevant Medications   rosuvastatin (CRESTOR) 10 MG tablet   losartan (COZAAR) 25 MG tablet   Other Relevant Orders   ECHOCARDIOGRAM COMPLETE   CT CORONARY MORPH W/CTA COR W/SCORE W/CA W/CM &/OR WO/CM   CT CORONARY  FRACTIONAL FLOW RESERVE DATA PREP   CT CORONARY FRACTIONAL FLOW RESERVE FLUID ANALYSIS   Nonobstructive atherosclerosis of coronary artery - Primary (Chronic)    Cardiac cath ablation long ago showed nonobstructive disease. She is now having some progressive symptoms that are concerning for potential angina. Some of her chest discomfort symptoms are somewhat atypical and she does have history of a thymoma. She has not had a CT scan a while. In  order to evaluate both potential etiologies, in order coronary CTA or a CT FFR (which is essentially allow for an anatomic and physiologic evaluation). This will allow [place] to potentially evaluate stenosis of noted, but also to assess her post thymectomy.  We'll also check a 2-D echocardiogram to assess EF and systolic/diastolic function.      Relevant Medications   rosuvastatin (CRESTOR) 10 MG tablet   losartan (COZAAR) 25 MG tablet   Other Relevant Orders   ECHOCARDIOGRAM COMPLETE   CT CORONARY MORPH W/CTA COR W/SCORE W/CA W/CM &/OR WO/CM   CT CORONARY FRACTIONAL FLOW RESERVE DATA PREP   CT CORONARY FRACTIONAL FLOW RESERVE FLUID ANALYSIS      Current medicines are reviewed at length with the patient today. (+/- concerns) n/a The following changes have been made: n/a  Patient Instructions  Your physician has requested that you have an echocardiogram @ [address]. Echocardiography is a painless test that uses sound waves to create images of your heart. It provides your doctor with information about the size and shape of your heart and how well your heart's chambers and valves are working. This procedure takes approximately one hour. There are no restrictions for this procedure.  Dr. Ellyn Hack has ordered a coronary CTA    Your physician recommends that you schedule a follow-up appointment with Dr. Ellyn Hack after your testing is complete.     Studies Ordered:   Orders Placed This Encounter  Procedures  . CT CORONARY MORPH  W/CTA COR W/SCORE W/CA W/CM &/OR WO/CM  . CT CORONARY FRACTIONAL FLOW RESERVE DATA PREP  . CT CORONARY FRACTIONAL FLOW RESERVE FLUID ANALYSIS  . ECHOCARDIOGRAM COMPLETE      Glenetta Hew, M.D., M.S. Interventional Cardiologist   Pager # [phone] Phone # [phone] [address] [place], [place] [address]

## 2016-06-05 ENCOUNTER — Encounter: Payer: Self-pay | Admitting: Cardiology

## 2016-06-05 ENCOUNTER — Ambulatory Visit: Payer: Medicare HMO | Admitting: Cardiology

## 2016-06-05 DIAGNOSIS — I2510 Atherosclerotic heart disease of native coronary artery without angina pectoris: Secondary | ICD-10-CM | POA: Insufficient documentation

## 2016-06-05 NOTE — Assessment & Plan Note (Signed)
Interesting symptoms of dizziness and lightheadedness. Check a 2-D echocardiogram and a coronary CTA. Doesn't sound like she is having arrhythmias. It actually seems more related to blood pressure changes. For now I would like to keep her with some prison hypertension. We talked about adequate hydration and eating.  Likely she has not had any true syncope. We can reassess this once we have the results of her studies.

## 2016-06-05 NOTE — Assessment & Plan Note (Signed)
Summary chest pain symptoms sound anginal in nature while others don't. It is not always there and only occurs on some occasions with different levels of exertion. However she does have evidence of coronary disease from previous evaluations.   Name: [facility] CTA with CT FFR.

## 2016-06-05 NOTE — Assessment & Plan Note (Signed)
Exertional dyspnea with associated chest discomfort. Need to exclude cardiac etiology. Plan: Check 2-D echocardiogram as well as a coronary CTA with CT FFR.

## 2016-06-05 NOTE — Assessment & Plan Note (Addendum)
Cardiac cath ablation long ago showed nonobstructive disease. She is now having some progressive symptoms that are concerning for potential angina. Some of her chest discomfort symptoms are somewhat atypical and she does have history of a thymoma. She has not had a CT scan a while. In order to evaluate both potential etiologies, in order coronary CTA or a CT FFR (which is essentially allow for an anatomic and physiologic evaluation). This will allow [place] to potentially evaluate stenosis of noted, but also to assess her post thymectomy.  We'll also check a 2-D echocardiogram to assess EF and systolic/diastolic function.

## 2016-06-05 NOTE — Assessment & Plan Note (Signed)
She is little hypertensive today having stopped her atenolol. This point with her fatigue and dizziness spells, I would prefer to keep her blood pressures slightly higher, until we know what her echo shows.

## 2016-06-05 NOTE — Assessment & Plan Note (Signed)
There've been plans in the past to reassess that with a chest CT. Since were not concerned about potential anginal symptoms I'm checking a coronary CTA which will allow [place] to evaluate for both issues.

## 2016-06-15 ENCOUNTER — Telehealth: Payer: Self-pay | Admitting: Cardiology

## 2016-06-15 NOTE — Telephone Encounter (Signed)
[place] calling and states that authorization for Coronary Artery Bipass Graph has been denied, if you have any questions, please use cas reference # [id number] thanks.

## 2016-06-16 ENCOUNTER — Telehealth: Payer: Self-pay | Admitting: *Deleted

## 2016-06-16 ENCOUNTER — Other Ambulatory Visit (HOSPITAL_COMMUNITY): Payer: Medicare HMO

## 2016-06-16 DIAGNOSIS — I2510 Atherosclerotic heart disease of native coronary artery without angina pectoris: Secondary | ICD-10-CM

## 2016-06-16 DIAGNOSIS — R0789 Other chest pain: Secondary | ICD-10-CM

## 2016-06-16 DIAGNOSIS — R0600 Dyspnea, unspecified: Secondary | ICD-10-CM

## 2016-06-16 DIAGNOSIS — R0609 Other forms of dyspnea: Principal | ICD-10-CM

## 2016-06-16 DIAGNOSIS — Z86018 Personal history of other benign neoplasm: Secondary | ICD-10-CM

## 2016-06-16 DIAGNOSIS — Z87898 Personal history of other specified conditions: Secondary | ICD-10-CM

## 2016-06-16 NOTE — Telephone Encounter (Signed)
Left message for patient to return call  Per Dr Ellyn Hack,  THE CAMERA FOR CORONARY CT IS UNAVAILABLE AT PRESENT TIME WILL NOT BE AVAILABLE UNTIL @ END OF June 2018.  DR HARDING WOULD LIKE TO CANCEL TEST AND SCHEDULE FOR MYOVIEW .

## 2016-06-19 ENCOUNTER — Ambulatory Visit (HOSPITAL_COMMUNITY): Payer: Medicare HMO | Attending: Cardiology

## 2016-06-19 ENCOUNTER — Other Ambulatory Visit: Payer: Self-pay

## 2016-06-19 DIAGNOSIS — I2510 Atherosclerotic heart disease of native coronary artery without angina pectoris: Secondary | ICD-10-CM | POA: Insufficient documentation

## 2016-06-19 DIAGNOSIS — R55 Syncope and collapse: Secondary | ICD-10-CM

## 2016-06-19 DIAGNOSIS — R0609 Other forms of dyspnea: Secondary | ICD-10-CM | POA: Diagnosis not present

## 2016-06-19 DIAGNOSIS — Z86018 Personal history of other benign neoplasm: Secondary | ICD-10-CM | POA: Insufficient documentation

## 2016-06-19 DIAGNOSIS — Z87898 Personal history of other specified conditions: Secondary | ICD-10-CM

## 2016-06-19 DIAGNOSIS — R5382 Chronic fatigue, unspecified: Secondary | ICD-10-CM | POA: Diagnosis not present

## 2016-06-19 DIAGNOSIS — R0600 Dyspnea, unspecified: Secondary | ICD-10-CM

## 2016-06-19 HISTORY — PX: TRANSTHORACIC ECHOCARDIOGRAM: SHX275

## 2016-06-20 NOTE — Telephone Encounter (Signed)
Left message to call back--  per dr harding patient needs to  schedule lexiscan myoview

## 2016-06-20 NOTE — Telephone Encounter (Signed)
Patient is aware cancelling coronary CT , SCHEDULING LEXISCAN MYOVIEW  AWARE SCHEDULER WILL CONTACT HER WITH DATE AND TIME

## 2016-06-20 NOTE — Progress Notes (Signed)
Echo results: Good news: Essentially normal echocardiogram and normal pump function and essentially normal valve function.   EF: 55-60%. Mild diastolic dysfunction (not unexpected for age. No regional wall motion abnormalities = No signs to suggest heart attack..  Please forward to Annette C. Ilene Qua, [place] = PCP

## 2016-06-20 NOTE — Telephone Encounter (Signed)
Follow up      Returning call to nurse

## 2016-06-22 ENCOUNTER — Telehealth (HOSPITAL_COMMUNITY): Payer: Self-pay

## 2016-06-22 NOTE — Telephone Encounter (Signed)
Encounter complete. 

## 2016-06-22 NOTE — Progress Notes (Signed)
Received message to cancel Cardiac Ct per Dr. Johnsie Cancel

## 2016-06-23 ENCOUNTER — Ambulatory Visit (HOSPITAL_COMMUNITY)
Admission: RE | Admit: 2016-06-23 | Discharge: 2016-06-23 | Disposition: A | Discharge status: Home / Self Care | Payer: Medicare HMO | Source: Ambulatory Visit | Attending: Cardiovascular Disease | Admitting: Cardiovascular Disease

## 2016-06-23 DIAGNOSIS — R0609 Other forms of dyspnea: Secondary | ICD-10-CM | POA: Diagnosis not present

## 2016-06-23 DIAGNOSIS — I2510 Atherosclerotic heart disease of native coronary artery without angina pectoris: Secondary | ICD-10-CM | POA: Diagnosis not present

## 2016-06-23 DIAGNOSIS — I10 Essential (primary) hypertension: Secondary | ICD-10-CM | POA: Diagnosis not present

## 2016-06-23 DIAGNOSIS — R0600 Dyspnea, unspecified: Secondary | ICD-10-CM

## 2016-06-23 DIAGNOSIS — R0789 Other chest pain: Secondary | ICD-10-CM

## 2016-06-23 DIAGNOSIS — Z86018 Personal history of other benign neoplasm: Secondary | ICD-10-CM

## 2016-06-23 DIAGNOSIS — Z87898 Personal history of other specified conditions: Secondary | ICD-10-CM

## 2016-06-23 HISTORY — PX: NM MYOVIEW LTD: HXRAD82

## 2016-06-23 MED ORDER — REGADENOSON 0.4 MG/5ML IV SOLN
0.4000 mg | Freq: Once | INTRAVENOUS | Status: AC
  Administered 2016-06-23: 0.4 mg via INTRAVENOUS

## 2016-06-23 MED ORDER — AMINOPHYLLINE 25 MG/ML IV SOLN
75.0000 mg | Freq: Once | INTRAVENOUS | Status: AC
  Administered 2016-06-23: 75 mg via INTRAVENOUS

## 2016-06-23 MED ORDER — TECHNETIUM TC 99M TETROFOSMIN IV KIT
10.9000 | PACK | Freq: Once | INTRAVENOUS | Status: AC | PRN
  Administered 2016-06-23: 10.9 via INTRAVENOUS
  Filled 2016-06-23: qty 11

## 2016-06-23 MED ORDER — TECHNETIUM TC 99M TETROFOSMIN IV KIT
30.3000 | PACK | Freq: Once | INTRAVENOUS | Status: AC | PRN
Start: 2016-06-23 — End: 2016-06-23
  Administered 2016-06-23: 30.3 via INTRAVENOUS
  Filled 2016-06-23: qty 31

## 2016-06-26 LAB — MYOCARDIAL PERFUSION IMAGING
CHL CUP RESTING HR STRESS: 53 {beats}/min
LVDIAVOL: 71 mL (ref 46–106)
LVSYSVOL: 22 mL
NUC STRESS TID: 0.88
Peak HR: 72 {beats}/min
SDS: 4
SRS: 2
SSS: 4

## 2016-06-27 DIAGNOSIS — L821 Other seborrheic keratosis: Secondary | ICD-10-CM | POA: Diagnosis not present

## 2016-06-27 DIAGNOSIS — D1801 Hemangioma of skin and subcutaneous tissue: Secondary | ICD-10-CM | POA: Diagnosis not present

## 2016-06-27 DIAGNOSIS — D225 Melanocytic nevi of trunk: Secondary | ICD-10-CM | POA: Diagnosis not present

## 2016-06-27 DIAGNOSIS — D2239 Melanocytic nevi of other parts of face: Secondary | ICD-10-CM | POA: Diagnosis not present

## 2016-07-05 ENCOUNTER — Ambulatory Visit (INDEPENDENT_AMBULATORY_CARE_PROVIDER_SITE_OTHER): Payer: Medicare HMO | Admitting: Cardiology

## 2016-07-05 DIAGNOSIS — R0600 Dyspnea, unspecified: Secondary | ICD-10-CM

## 2016-07-05 DIAGNOSIS — Z86018 Personal history of other benign neoplasm: Secondary | ICD-10-CM | POA: Diagnosis not present

## 2016-07-05 DIAGNOSIS — I10 Essential (primary) hypertension: Secondary | ICD-10-CM | POA: Diagnosis not present

## 2016-07-05 DIAGNOSIS — R0789 Other chest pain: Secondary | ICD-10-CM | POA: Diagnosis not present

## 2016-07-05 DIAGNOSIS — I2510 Atherosclerotic heart disease of native coronary artery without angina pectoris: Secondary | ICD-10-CM | POA: Diagnosis not present

## 2016-07-05 DIAGNOSIS — Z87898 Personal history of other specified conditions: Secondary | ICD-10-CM

## 2016-07-05 DIAGNOSIS — R0609 Other forms of dyspnea: Secondary | ICD-10-CM | POA: Diagnosis not present

## 2016-07-05 DIAGNOSIS — R55 Syncope and collapse: Secondary | ICD-10-CM | POA: Diagnosis not present

## 2016-07-05 NOTE — Progress Notes (Signed)
PCP: Randel Books, FNP  Clinic Note: Chief Complaint  Patient presents with  . Follow-up    followup after ECHO / Myoview for CP    HPI: Belinda Brown is a 82 y.o. female with a PMH below who presents today for 1-2 month follow-up for initial evaluation dizziness, near syncope and chest discomfort - initially seen on 06/02/2016 at the request of of Randel Books, FNP. She was previously followed by Dr. Lennox Pippins for over 10 years for her mild nonobstructive coronary disease. She requested transfer a cardiology care to [facility] in order to be in the [place].  She had a heart catheterization 2008 which showed roughly 50% disease (unfortunately do not have the Results. She also had a stress echocardiogram done in 2016 that was presumably normal.  She is has a history of thymoma, and there was plans for her to have a CT scan of the chest done to assess her potential thymoma issues as well as potential hilar hernia.  Belinda Brown was initially seen on April 20 noting near syncopal episodes or dizziness. Never actually lost consciousness. At least twice a week. No rapid irregular heartbeats. This is sensitive loss of balance. Also noted episodic chest discomfort off and on for a few weeks. Probably related to exertion. Felt fatigued and tired. I had initially recommended coronary CTA, however this would not machine not being present. Then, her insurance company suggested that she was too high risk for CTA. I decided to cancel CT and ordered a Myoview stress test.  Recent Hospitalizations: n/a  Studies Personally Reviewed - (if available, images/films reviewed: From Epic Chart or Care Everywhere)  2-D Echo 06/19/2016: Normal LV size and function. EF 55-60%. GR 1 DD. No regional wall motion abnormalities. Mild (grade 1) DD. Mild LVH and trace AI,MR,TR  Myoview Stress Test 06/23/2016: Nuclear stress EF: 69%. Normal perfusion; This is a low risk study.  Interval History: Belinda Brown  returns today to discuss results of her tests. She is quite happy to hear the results. She still says that she "gives out of energy" while doing anything any activity this may or may not be associated with chest tightness, but she still has some intermittent chest discomfort. She also has dizzy spells which she may feel weak or woozy off and on, but has not had any syncope or near syncope. Oftentimes when she is out doing her yard work or other activity she'll stop and rest for little bit, but does note that overall her symptoms have improved. She thinks some of these episodes and symptoms may be related to her recent pneumonia.  No PND, orthopnea or edema. Nsignificant rapid irregular heartbeats or palpitations. No TIA/amaurosis fugax symptoms. No melena, hematochezia, hematuria, or epstaxis. No claudication.  ROS: A comprehensive was performed. Review of Systems  Constitutional: Positive for malaise/fatigue (Easily fatigable).  HENT: Negative for congestion (Clearing up).   Respiratory: Positive for shortness of breath. Negative for cough and wheezing.        See history of present illness  Gastrointestinal: Negative for blood in stool and melena.  Genitourinary: Negative for hematuria.  Neurological: Negative for dizziness and focal weakness.  All other systems reviewed and are negative.  I have reviewed and (if needed) personally updated the patient's problem list, medications, allergies, past medical and surgical history, social and family history.   Past Medical History:  Diagnosis Date  . Hypercholesteremia   . Hypertension   . Nonobstructive atherosclerosis of coronary artery  2008   Nonobstructive disease by cath  . Thymoma 11/24/2005   s/p resection of thymic mass    Past Surgical History:  Procedure Laterality Date  . CARDIAC CATHETERIZATION  2008   [facility] - Dr. Geraldo Pitter -- non-obstructive CAD (by pt report - ~50% single vessel)  . NM MYOVIEW LTD  06/23/2016     EF 30-35% with severe hypokinesis of the distal septal, distal inferior, mid/distal anterior, mid/distal anterolateral walls and Akinesis of the apex. The cavity size was normal. Wall thickness was normal. Trivial Aortic regurgitation. Mitral Mild MR  . partial mediastinoscopy with thymectomy and resection of thymic mass  11/24/2005   [place]  . TRANSTHORACIC ECHOCARDIOGRAM  06/19/2016   Normal LV size and function. EF 55-60%. GR 1 DD. No regional wall motion abnormalities. Mild (grade 1) DD. Mild LVH and trace AI,MR,TR  . Treadmill Stress Echo  2016   [facility]: report not avaialble.  Pt reports - Normal result    Current Meds  Medication Sig  . aspirin 81 MG tablet Take 81 mg by mouth daily.    . B Complex-Biotin-FA (B COMPLETE PO) Take by mouth.  . fish oil-omega-3 fatty acids 1000 MG capsule Take 2 g by mouth daily.    Marland Kitchen losartan (COZAAR) 25 MG tablet Take 12.5 mg by mouth daily.  . Probiotic Product (PROBIOTIC DAILY PO) Take by mouth.  . rosuvastatin (CRESTOR) 10 MG tablet Take 1/2 tablet Daily  . vitamin C (ASCORBIC ACID) 500 MG tablet Take 500 mg by mouth daily.    . Vitamin D, Ergocalciferol, (DRISDOL) 50000 UNITS CAPS Take 50,000 Units by mouth.      Allergies  Allergen Reactions  . Ampicillin Hives  . Codeine Other (See Comments)    hallucinations    Social History   Social History  . Marital status: Married    Spouse name: N/A  . Number of children: N/A  . Years of education: N/A   Social History Main Topics  . Smoking status: Never Smoker  . Smokeless tobacco: Never Used  . Alcohol use Yes     Comment: not on a regular basis  . Drug use: No  . Sexual activity: Not Asked   Other Topics Concern  . None   Social History Narrative  . None    family history includes COPD in her brother; Cancer in her sister; Congestive Heart Failure in her mother; Heart attack in her father; Heart failure in her maternal grandmother, paternal grandfather, and paternal  grandmother; Pneumonia in her maternal grandfather.  Wt Readings from Last 3 Encounters:  07/05/16 146 lb (66.2 kg)  06/23/16 145 lb (65.8 kg)  06/02/16 145 lb (65.8 kg)    PHYSICAL EXAM BP 116/72   Pulse 68   Ht 5\' 5"  (1.651 m)   Wt 146 lb (66.2 kg)   BMI 24.30 kg/m  General appearance: alert, cooperative, appears stated age, no distress. Relatively healthy-appearing. Well-nourished, well-groomed Neck: no adenopathy, no carotid bruit and no JVD Lungs: clear to auscultation bilaterally, normal percussion bilaterally and non-labored Heart: regular rate and rhythm, S1 &S2 normal, no murmur, click, rub or gallop; nondisplaced PMI Abdomen: soft, non-tender; bowel sounds normal; no masses,  no organomegaly; no HJR Extremities: extremities normal, atraumatic, no cyanosis, and edema -none Pulses: 2+ and symmetric;  Neurologic: Mental status: Alert & oriented x 3, thought content appropriate; non-focal exam.  Pleasant mood & affect.   Adult ECG Report n/a  Other studies Reviewed: Additional studies/ records  that were reviewed today include:  Recent Labs:   No results found for: CREATININE, BUN, NA, K, CL, CO2 No results found for: CHOL, HDL, LDLCALC, LDLDIRECT, TRIG, CHOLHDL  ASSESSMENT / PLAN: Problem List Items Addressed This Visit    Atypical chest pain    Unfortunately, we didn't get the anatomic data that was expected with a coronary CTA with CT FFR. However we do have a negative/nonischemic Myoview.  This would argue against progression of disease from cardiac catheterization 10 years ago.      DOE (dyspnea on exertion)    Negative Myoview. Relatively normal echocardiogram. Would suggest probably deconditioning having recovered from pneumonia.      Essential hypertension (Chronic)    She was hypertensive when I first saw her after she had stopped her atenolol. But now she is stable losartan. Placenta continue losartan this point would be to avoid restarting atenolol  because of her near syncopal episodes. Allowing for permissive hypertension at this point based on her normal echo.      H/O thymoma (Chronic)    I think if symptoms are still persistent in the follow-up visit, we will try to do a CT scan of the chest. Potentially this can be seen with the the chest CTA if done in separate stages.      Near syncope    Not really true syncopal symptoms. Is more dizziness and lightheaded. The echo did not show any cardiac etiology. No signs or symptoms to suggest this is related to arrhythmia either. Probably related to hypotension with dehydration. Reiterated the need for adequate by mouth intake and hydration.      Nonobstructive atherosclerosis of coronary artery (Chronic)    Cardiac cath several years ago showed no obstructive disease. She now has symptoms that were inserted for possible angina but has a negative Myoview.  She was reassured these findings. I think however if her symptoms were to progress I would want to consider anatomic evaluation with either coronary CTA or cardiac catheterization. I do not think that she was too high risk for coronary CTA as now she has a negative Myoview. Normal echo as well.         Current medicines are reviewed at length with the patient today. (+/- concerns) n/a The following changes have been made: n/a  Patient Instructions  No changes with current medications or treatment   Your physician wants you to follow-up in 4-6 months with DR Jose Alleyne. You will receive a reminder letter in the mail two months in advance. If you don't receive a letter, please call our office to schedule the follow-up appointment.   Studies Ordered:   No orders of the defined types were placed in this encounter.     Glenetta Hew, M.D., M.S. Interventional Cardiologist   Pager # [phone] Phone # [phone] [address] [place],  [address]

## 2016-07-05 NOTE — Patient Instructions (Addendum)
No changes with current medications or treatment   Your physician wants you to follow-up in 4-6 months with DR HARDING. You will receive a reminder letter in the mail two months in advance. If you don't receive a letter, please call our office to schedule the follow-up appointment.

## 2016-07-07 ENCOUNTER — Encounter: Payer: Self-pay | Admitting: Cardiology

## 2016-07-07 NOTE — Assessment & Plan Note (Signed)
Unfortunately, we didn't get the anatomic data that was expected with a coronary CTA with CT FFR. However we do have a negative/nonischemic Myoview.  This would argue against progression of disease from cardiac catheterization 10 years ago.

## 2016-07-07 NOTE — Assessment & Plan Note (Signed)
Cardiac cath several years ago showed no obstructive disease. She now has symptoms that were inserted for possible angina but has a negative Myoview.  She was reassured these findings. I think however if her symptoms were to progress I would want to consider anatomic evaluation with either coronary CTA or cardiac catheterization. I do not think that she was too high risk for coronary CTA as now she has a negative Myoview. Normal echo as well.

## 2016-07-07 NOTE — Assessment & Plan Note (Signed)
Not really true syncopal symptoms. Is more dizziness and lightheaded. The echo did not show any cardiac etiology. No signs or symptoms to suggest this is related to arrhythmia either. Probably related to hypotension with dehydration. Reiterated the need for adequate by mouth intake and hydration.

## 2016-07-07 NOTE — Assessment & Plan Note (Signed)
She was hypertensive when I first saw her after she had stopped her atenolol. But now she is stable losartan. Placenta continue losartan this point would be to avoid restarting atenolol because of her near syncopal episodes. Allowing for permissive hypertension at this point based on her normal echo.

## 2016-07-07 NOTE — Assessment & Plan Note (Signed)
I think if symptoms are still persistent in the follow-up visit, we will try to do a CT scan of the chest. Potentially this can be seen with the the chest CTA if done in separate stages.

## 2016-07-07 NOTE — Assessment & Plan Note (Signed)
Negative Myoview. Relatively normal echocardiogram. Would suggest probably deconditioning having recovered from pneumonia.

## 2016-07-20 DIAGNOSIS — N3090 Cystitis, unspecified without hematuria: Secondary | ICD-10-CM | POA: Diagnosis not present

## 2016-07-20 DIAGNOSIS — N3001 Acute cystitis with hematuria: Secondary | ICD-10-CM | POA: Diagnosis not present

## 2016-08-13 DIAGNOSIS — R350 Frequency of micturition: Secondary | ICD-10-CM | POA: Diagnosis not present

## 2016-08-13 DIAGNOSIS — N3000 Acute cystitis without hematuria: Secondary | ICD-10-CM | POA: Diagnosis not present

## 2016-08-22 DIAGNOSIS — I10 Essential (primary) hypertension: Secondary | ICD-10-CM | POA: Diagnosis not present

## 2016-08-22 DIAGNOSIS — E785 Hyperlipidemia, unspecified: Secondary | ICD-10-CM | POA: Diagnosis not present

## 2016-08-24 DIAGNOSIS — I2510 Atherosclerotic heart disease of native coronary artery without angina pectoris: Secondary | ICD-10-CM | POA: Diagnosis not present

## 2016-08-24 DIAGNOSIS — N390 Urinary tract infection, site not specified: Secondary | ICD-10-CM | POA: Diagnosis not present

## 2016-08-24 DIAGNOSIS — I10 Essential (primary) hypertension: Secondary | ICD-10-CM | POA: Diagnosis not present

## 2016-08-24 DIAGNOSIS — E785 Hyperlipidemia, unspecified: Secondary | ICD-10-CM | POA: Diagnosis not present

## 2016-09-05 DIAGNOSIS — Z9181 History of falling: Secondary | ICD-10-CM | POA: Diagnosis not present

## 2016-09-05 DIAGNOSIS — E785 Hyperlipidemia, unspecified: Secondary | ICD-10-CM | POA: Diagnosis not present

## 2016-09-05 DIAGNOSIS — Z1389 Encounter for screening for other disorder: Secondary | ICD-10-CM | POA: Diagnosis not present

## 2016-09-05 DIAGNOSIS — Z23 Encounter for immunization: Secondary | ICD-10-CM | POA: Diagnosis not present

## 2016-10-05 DIAGNOSIS — R971 Elevated cancer antigen 125 [CA 125]: Secondary | ICD-10-CM | POA: Diagnosis not present

## 2016-10-05 DIAGNOSIS — R350 Frequency of micturition: Secondary | ICD-10-CM | POA: Diagnosis not present

## 2016-10-05 DIAGNOSIS — Z124 Encounter for screening for malignant neoplasm of cervix: Secondary | ICD-10-CM | POA: Diagnosis not present

## 2016-10-05 DIAGNOSIS — Z1231 Encounter for screening mammogram for malignant neoplasm of breast: Secondary | ICD-10-CM | POA: Diagnosis not present

## 2016-10-05 DIAGNOSIS — R1084 Generalized abdominal pain: Secondary | ICD-10-CM | POA: Diagnosis not present

## 2016-10-05 DIAGNOSIS — Z01419 Encounter for gynecological examination (general) (routine) without abnormal findings: Secondary | ICD-10-CM | POA: Diagnosis not present

## 2016-10-06 DIAGNOSIS — H26493 Other secondary cataract, bilateral: Secondary | ICD-10-CM | POA: Diagnosis not present

## 2016-10-06 DIAGNOSIS — H04123 Dry eye syndrome of bilateral lacrimal glands: Secondary | ICD-10-CM | POA: Diagnosis not present

## 2016-10-13 ENCOUNTER — Telehealth: Payer: Self-pay | Admitting: *Deleted

## 2016-10-13 NOTE — Telephone Encounter (Signed)
Contacted Seth Bake at [facility] and left a message with the date/time of the appt. Appt on September 10th at 10:45am, arrive between 10:15 and 10:30.

## 2016-10-17 DIAGNOSIS — R69 Illness, unspecified: Secondary | ICD-10-CM | POA: Diagnosis not present

## 2016-10-23 ENCOUNTER — Ambulatory Visit (HOSPITAL_BASED_OUTPATIENT_CLINIC_OR_DEPARTMENT_OTHER): Payer: Medicare HMO

## 2016-10-23 ENCOUNTER — Encounter: Payer: Self-pay | Admitting: Gynecologic Oncology

## 2016-10-23 ENCOUNTER — Ambulatory Visit: Payer: Medicare HMO | Attending: Gynecologic Oncology | Admitting: Gynecologic Oncology

## 2016-10-23 VITALS — BP 161/74 | HR 68 | Temp 98.2°F | Resp 20 | Ht 64.0 in | Wt 145.5 lb

## 2016-10-23 DIAGNOSIS — K529 Noninfective gastroenteritis and colitis, unspecified: Secondary | ICD-10-CM

## 2016-10-23 DIAGNOSIS — E7800 Pure hypercholesterolemia, unspecified: Secondary | ICD-10-CM | POA: Diagnosis not present

## 2016-10-23 DIAGNOSIS — I2510 Atherosclerotic heart disease of native coronary artery without angina pectoris: Secondary | ICD-10-CM | POA: Diagnosis not present

## 2016-10-23 DIAGNOSIS — R109 Unspecified abdominal pain: Secondary | ICD-10-CM | POA: Diagnosis not present

## 2016-10-23 DIAGNOSIS — R1084 Generalized abdominal pain: Secondary | ICD-10-CM | POA: Diagnosis not present

## 2016-10-23 DIAGNOSIS — R971 Elevated cancer antigen 125 [CA 125]: Secondary | ICD-10-CM

## 2016-10-23 DIAGNOSIS — I10 Essential (primary) hypertension: Secondary | ICD-10-CM | POA: Insufficient documentation

## 2016-10-23 DIAGNOSIS — N83201 Unspecified ovarian cyst, right side: Secondary | ICD-10-CM | POA: Insufficient documentation

## 2016-10-23 DIAGNOSIS — Z79899 Other long term (current) drug therapy: Secondary | ICD-10-CM | POA: Insufficient documentation

## 2016-10-23 DIAGNOSIS — R1032 Left lower quadrant pain: Secondary | ICD-10-CM

## 2016-10-23 DIAGNOSIS — Z7982 Long term (current) use of aspirin: Secondary | ICD-10-CM | POA: Insufficient documentation

## 2016-10-23 LAB — BUN AND CREATININE (CC13)
BUN: 11.7 mg/dL (ref 7.0–26.0)
Creatinine: 0.8 mg/dL (ref 0.6–1.1)
EGFR: 70 mL/min/{1.73_m2} — ABNORMAL LOW (ref >90)

## 2016-10-23 NOTE — Progress Notes (Signed)
Consult Note: Gyn-Onc  Consult was requested by Dr. Ronita Hipps for the evaluation of Belinda Brown 82 y.o. female  CC:  Chief Complaint  Patient presents with  . Elevated CA-125    Assessment/Brown:  Belinda Brown  is a 82 y.o.  year old with a 1cm right ovarian cyst, abdominal pain and a mildly elevated CA 125. Her symptoms are fairly classic for diverticulitis/osis and she reports a change in diet in the past year.  I will obtain a CT abdo/pelvis and if this fails to show stigmata of ovarian cancer we can be assured that this is not the case. In that case I would have her seen by her GI physician for evaluation and counseling regarding diverticulosis. I discussed that inflammation of the sigmoid colon can result in mild elevations of CA 125 which is a very non-specific marker.  HPI: Belinda Brown is an 82 year old woman who is seen in consultation at the request of Dr Ronita Hipps for a right ovarian cyst and elevated tumor marker.  The patient reports that after her husband died a year ago, she began cooking less and eating processed foods more. She began experiencing bouts of intermittent abdominal pains, transient, gas-like pains, worse after eating and associated with diarrhea. She reported this to her OBGYN who performed a TVUS on 10/05/16 which showed a uterus measuring 5.5x3.3x2.8cm and a normal 1cm left ovary and a right ovary measuring 1.1x0.7x0.5cm. The right ovary contained a 1.1cm cyst. This cyst had been present on a 2010 scan. A CA 125 was drawn on 10/05/16 which was elevated at 59 (upper range normal <35).   The patient reports a family history significant for a sister with abdominal cancer that was possibly an ovarian cancer. It was treated with only one dose of chemotherapy prior to her having a stroke, after which she assumed hospice care.   The patient is otherwise very healthy for her advanced age. Her only prior abdominal surgery was a tubal ligation. Of note, at the time of  her tubal ligation the trochar was placed inadvertently through the gastric wall.  Current Meds:  Outpatient Encounter Prescriptions as of 10/23/2016  Medication Sig  . aspirin 81 MG tablet Take 81 mg by mouth daily.    . B Complex-Biotin-FA (B COMPLETE PO) Take by mouth.  . Coenzyme Q10 (CO Q 10 PO) Take by mouth.  . fish oil-omega-3 fatty acids 1000 MG capsule Take 2 g by mouth daily.    Marland Kitchen losartan (COZAAR) 25 MG tablet Take 12.5 mg by mouth daily.  . vitamin C (ASCORBIC ACID) 500 MG tablet Take 500 mg by mouth daily.    . Vitamin D, Ergocalciferol, (DRISDOL) 50000 UNITS CAPS Take 50,000 Units by mouth.    . [DISCONTINUED] Probiotic Product (PROBIOTIC DAILY PO) Take by mouth.  . [DISCONTINUED] rosuvastatin (CRESTOR) 10 MG tablet Take 1/2 tablet Daily   No facility-administered encounter medications on file as of 10/23/2016.     Allergy:  Allergies  Allergen Reactions  . Ampicillin Hives  . Codeine Other (See Comments)    hallucinations    Social Hx:   Social History   Social History  . Marital status: Married    Spouse name: N/A  . Number of children: N/A  . Years of education: N/A   Occupational History  . Not on file.   Social History Main Topics  . Smoking status: Never Smoker  . Smokeless tobacco: Never Used  . Alcohol use Yes  Comment: not on a regular basis  . Drug use: No  . Sexual activity: Not on file   Other Topics Concern  . Not on file   Social History Narrative  . No narrative on file    Past Surgical Hx:  Past Surgical History:  Procedure Laterality Date  . CARDIAC CATHETERIZATION  2008   [facility] - Dr. Geraldo Pitter -- non-obstructive CAD (by pt report - ~50% single vessel)  . NM MYOVIEW LTD  06/23/2016   EF 30-35% with severe hypokinesis of the distal septal, distal inferior, mid/distal anterior, mid/distal anterolateral walls and Akinesis of the apex. The cavity size was normal. Wall thickness was normal. Trivial Aortic  regurgitation. Mitral Mild MR  . partial mediastinoscopy with thymectomy and resection of thymic mass  11/24/2005   [place]  . TRANSTHORACIC ECHOCARDIOGRAM  06/19/2016   Normal LV size and function. EF 55-60%. GR 1 DD. No regional wall motion abnormalities. Mild (grade 1) DD. Mild LVH and trace AI,MR,TR  . Treadmill Stress Echo  2016   [facility]: report not avaialble.  Pt reports - Normal result    Past Medical Hx:  Past Medical History:  Diagnosis Date  . Hypercholesteremia   . Hypertension   . Nonobstructive atherosclerosis of coronary artery 2008   Nonobstructive disease by cath  . Thymoma 11/24/2005   s/p resection of thymic mass    Past Gynecological History:  SVD x 2 No LMP recorded. Patient is postmenopausal.  Family Hx:  Family History  Problem Relation Age of Onset  . Congestive Heart Failure Mother   . Heart attack Father   . Cancer Sister   . COPD Brother   . Heart failure Maternal Grandmother   . Pneumonia Maternal Grandfather   . Heart failure Paternal Grandmother   . Heart failure Paternal Grandfather     Review of Systems:  Constitutional  Feels well,    ENT Normal appearing ears and nares bilaterally Skin/Breast  No rash, sores, jaundice, itching, dryness Cardiovascular  No chest pain, shortness of breath, or edema  Pulmonary  No cough or wheeze.  Gastro Intestinal  No nausea, vomitting, or diarrhoea. No bright red blood per rectum, + abdominal pain, +change in bowel movement, + diarrhea.  Genito Urinary  No frequency, urgency, dysuria, intermittent abdominal pelvic pain. Musculo Skeletal  No myalgia, arthralgia, joint swelling or pain  Neurologic  No weakness, numbness, change in gait,  Psychology  No depression, anxiety, insomnia.   Vitals:  Blood pressure (!) 161/74, pulse 68, temperature 98.2 F (36.8 C), resp. rate 20, height 5\' 4"  (1.626 m), weight 145 lb 8 oz (66 kg), SpO2 100 %.  Physical Exam: WD in NAD Neck  Supple NROM,  without any enlargements.  Lymph Node Survey No cervical supraclavicular or inguinal adenopathy Cardiovascular  Pulse normal rate, regularity and rhythm. S1 and S2 normal.  Lungs  Clear to auscultation bilateraly, without wheezes/crackles/rhonchi. Good air movement.  Skin  No rash/lesions/breakdown  Psychiatry  Alert and oriented to person, place, and time  Abdomen  Normoactive bowel sounds, abdomen soft, non-tender and thin without evidence of hernia.  Back No CVA tenderness Genito Urinary  Vulva/vagina: Normal external female genitalia.   No lesions. No discharge or bleeding.  Bladder/urethra:  No lesions or masses, well supported bladder  Vagina: normal  Cervix: Normal appearing, no lesions.  Uterus:  Small, mobile, no parametrial involvement or nodularity.  Adnexa: no palpable masses. Rectal  Good tone, no masses no cul de sac  nodularity.  Extremities  No bilateral cyanosis, clubbing or edema.   Donaciano Eva, MD  10/23/2016, 12:20 PM

## 2016-10-31 ENCOUNTER — Ambulatory Visit (HOSPITAL_COMMUNITY)
Admission: RE | Admit: 2016-10-31 | Discharge: 2016-10-31 | Disposition: A | Discharge status: Home / Self Care | Payer: Medicare HMO | Source: Ambulatory Visit | Attending: Gynecologic Oncology | Admitting: Gynecologic Oncology

## 2016-10-31 ENCOUNTER — Encounter (HOSPITAL_COMMUNITY): Payer: Self-pay

## 2016-10-31 DIAGNOSIS — R918 Other nonspecific abnormal finding of lung field: Secondary | ICD-10-CM | POA: Diagnosis not present

## 2016-10-31 DIAGNOSIS — M47896 Other spondylosis, lumbar region: Secondary | ICD-10-CM | POA: Insufficient documentation

## 2016-10-31 DIAGNOSIS — R1032 Left lower quadrant pain: Secondary | ICD-10-CM | POA: Diagnosis not present

## 2016-10-31 DIAGNOSIS — Z8500 Personal history of malignant neoplasm of unspecified digestive organ: Secondary | ICD-10-CM | POA: Insufficient documentation

## 2016-10-31 DIAGNOSIS — R1084 Generalized abdominal pain: Secondary | ICD-10-CM | POA: Diagnosis not present

## 2016-10-31 DIAGNOSIS — R971 Elevated cancer antigen 125 [CA 125]: Secondary | ICD-10-CM | POA: Diagnosis not present

## 2016-10-31 DIAGNOSIS — I700 Atherosclerosis of aorta: Secondary | ICD-10-CM | POA: Diagnosis not present

## 2016-10-31 DIAGNOSIS — R109 Unspecified abdominal pain: Secondary | ICD-10-CM | POA: Diagnosis not present

## 2016-10-31 DIAGNOSIS — M4826 Kissing spine, lumbar region: Secondary | ICD-10-CM | POA: Diagnosis not present

## 2016-10-31 DIAGNOSIS — N83201 Unspecified ovarian cyst, right side: Secondary | ICD-10-CM | POA: Diagnosis not present

## 2016-10-31 DIAGNOSIS — K5730 Diverticulosis of large intestine without perforation or abscess without bleeding: Secondary | ICD-10-CM | POA: Diagnosis not present

## 2016-10-31 MED ORDER — IOPAMIDOL (ISOVUE-300) INJECTION 61%
INTRAVENOUS | Status: AC
Start: 2016-10-31 — End: 2016-10-31
  Administered 2016-10-31: 100 mL via INTRAVENOUS
  Filled 2016-10-31: qty 100

## 2016-10-31 MED ORDER — IOPAMIDOL (ISOVUE-300) INJECTION 61%
100.0000 mL | Freq: Once | INTRAVENOUS | Status: AC | PRN
  Administered 2016-10-31: 100 mL via INTRAVENOUS

## 2016-11-06 ENCOUNTER — Telehealth: Payer: Self-pay | Admitting: Gynecologic Oncology

## 2016-11-06 NOTE — Telephone Encounter (Signed)
Will attempt to reach patient at a later time to discuss scan.

## 2016-11-07 ENCOUNTER — Telehealth: Payer: Self-pay | Admitting: Gynecologic Oncology

## 2016-11-07 NOTE — Telephone Encounter (Signed)
Patient returned call to the office.  Informed her of CT scan results.  She states she has been watching her diet and her abd pain has improved.  She will follow up with Dr. Melina Copa in [place] as planned.  Advised to call for any needs or concerns.

## 2016-11-07 NOTE — Telephone Encounter (Signed)
Attempted to call patient with Ct scan results.  Will retry at later time.

## 2016-11-13 DIAGNOSIS — H26493 Other secondary cataract, bilateral: Secondary | ICD-10-CM | POA: Diagnosis not present

## 2016-11-13 DIAGNOSIS — Z961 Presence of intraocular lens: Secondary | ICD-10-CM | POA: Diagnosis not present

## 2016-11-13 DIAGNOSIS — H43813 Vitreous degeneration, bilateral: Secondary | ICD-10-CM | POA: Diagnosis not present

## 2016-11-20 DIAGNOSIS — H43813 Vitreous degeneration, bilateral: Secondary | ICD-10-CM | POA: Diagnosis not present

## 2017-02-26 DIAGNOSIS — E559 Vitamin D deficiency, unspecified: Secondary | ICD-10-CM | POA: Diagnosis not present

## 2017-02-26 DIAGNOSIS — E538 Deficiency of other specified B group vitamins: Secondary | ICD-10-CM | POA: Diagnosis not present

## 2017-02-26 DIAGNOSIS — E785 Hyperlipidemia, unspecified: Secondary | ICD-10-CM | POA: Diagnosis not present

## 2017-02-26 DIAGNOSIS — I10 Essential (primary) hypertension: Secondary | ICD-10-CM | POA: Diagnosis not present

## 2017-03-01 DIAGNOSIS — I10 Essential (primary) hypertension: Secondary | ICD-10-CM | POA: Diagnosis not present

## 2017-03-01 DIAGNOSIS — Z6824 Body mass index (BMI) 24.0-24.9, adult: Secondary | ICD-10-CM | POA: Diagnosis not present

## 2017-03-01 DIAGNOSIS — I2510 Atherosclerotic heart disease of native coronary artery without angina pectoris: Secondary | ICD-10-CM | POA: Diagnosis not present

## 2017-03-01 DIAGNOSIS — E785 Hyperlipidemia, unspecified: Secondary | ICD-10-CM | POA: Diagnosis not present

## 2017-03-01 DIAGNOSIS — Z1331 Encounter for screening for depression: Secondary | ICD-10-CM | POA: Diagnosis not present

## 2017-03-01 DIAGNOSIS — M25551 Pain in right hip: Secondary | ICD-10-CM | POA: Diagnosis not present

## 2017-03-27 DIAGNOSIS — M545 Low back pain: Secondary | ICD-10-CM | POA: Diagnosis not present

## 2017-03-27 DIAGNOSIS — M25551 Pain in right hip: Secondary | ICD-10-CM | POA: Diagnosis not present

## 2017-04-03 DIAGNOSIS — M545 Low back pain: Secondary | ICD-10-CM | POA: Diagnosis not present

## 2017-04-03 DIAGNOSIS — M62551 Muscle wasting and atrophy, not elsewhere classified, right thigh: Secondary | ICD-10-CM | POA: Diagnosis not present

## 2017-04-03 DIAGNOSIS — R2689 Other abnormalities of gait and mobility: Secondary | ICD-10-CM | POA: Diagnosis not present

## 2017-04-05 DIAGNOSIS — R2689 Other abnormalities of gait and mobility: Secondary | ICD-10-CM | POA: Diagnosis not present

## 2017-04-05 DIAGNOSIS — M545 Low back pain: Secondary | ICD-10-CM | POA: Diagnosis not present

## 2017-04-05 DIAGNOSIS — M62551 Muscle wasting and atrophy, not elsewhere classified, right thigh: Secondary | ICD-10-CM | POA: Diagnosis not present

## 2017-04-10 DIAGNOSIS — R2689 Other abnormalities of gait and mobility: Secondary | ICD-10-CM | POA: Diagnosis not present

## 2017-04-10 DIAGNOSIS — M62551 Muscle wasting and atrophy, not elsewhere classified, right thigh: Secondary | ICD-10-CM | POA: Diagnosis not present

## 2017-04-10 DIAGNOSIS — M545 Low back pain: Secondary | ICD-10-CM | POA: Diagnosis not present

## 2017-04-13 DIAGNOSIS — R2689 Other abnormalities of gait and mobility: Secondary | ICD-10-CM | POA: Diagnosis not present

## 2017-04-13 DIAGNOSIS — M545 Low back pain: Secondary | ICD-10-CM | POA: Diagnosis not present

## 2017-04-13 DIAGNOSIS — M62551 Muscle wasting and atrophy, not elsewhere classified, right thigh: Secondary | ICD-10-CM | POA: Diagnosis not present

## 2017-04-16 DIAGNOSIS — R69 Illness, unspecified: Secondary | ICD-10-CM | POA: Diagnosis not present

## 2017-04-21 DIAGNOSIS — M545 Low back pain: Secondary | ICD-10-CM | POA: Diagnosis not present

## 2017-06-28 DIAGNOSIS — M4316 Spondylolisthesis, lumbar region: Secondary | ICD-10-CM | POA: Diagnosis not present

## 2017-06-28 DIAGNOSIS — M5136 Other intervertebral disc degeneration, lumbar region: Secondary | ICD-10-CM | POA: Diagnosis not present

## 2017-07-10 DIAGNOSIS — G8929 Other chronic pain: Secondary | ICD-10-CM | POA: Diagnosis not present

## 2017-07-10 DIAGNOSIS — I10 Essential (primary) hypertension: Secondary | ICD-10-CM | POA: Diagnosis not present

## 2017-07-10 DIAGNOSIS — Z7982 Long term (current) use of aspirin: Secondary | ICD-10-CM | POA: Diagnosis not present

## 2017-07-10 DIAGNOSIS — M810 Age-related osteoporosis without current pathological fracture: Secondary | ICD-10-CM | POA: Diagnosis not present

## 2017-07-17 DIAGNOSIS — M5136 Other intervertebral disc degeneration, lumbar region: Secondary | ICD-10-CM | POA: Diagnosis not present

## 2017-08-13 DIAGNOSIS — R399 Unspecified symptoms and signs involving the genitourinary system: Secondary | ICD-10-CM | POA: Diagnosis not present

## 2017-08-13 DIAGNOSIS — E559 Vitamin D deficiency, unspecified: Secondary | ICD-10-CM | POA: Diagnosis not present

## 2017-08-13 DIAGNOSIS — R5383 Other fatigue: Secondary | ICD-10-CM | POA: Diagnosis not present

## 2017-08-13 DIAGNOSIS — E538 Deficiency of other specified B group vitamins: Secondary | ICD-10-CM | POA: Diagnosis not present

## 2017-08-13 DIAGNOSIS — Z6824 Body mass index (BMI) 24.0-24.9, adult: Secondary | ICD-10-CM | POA: Diagnosis not present

## 2017-08-13 DIAGNOSIS — E785 Hyperlipidemia, unspecified: Secondary | ICD-10-CM | POA: Diagnosis not present

## 2017-08-13 DIAGNOSIS — I10 Essential (primary) hypertension: Secondary | ICD-10-CM | POA: Diagnosis not present

## 2017-08-14 DIAGNOSIS — R5383 Other fatigue: Secondary | ICD-10-CM | POA: Diagnosis not present

## 2017-08-14 DIAGNOSIS — I10 Essential (primary) hypertension: Secondary | ICD-10-CM | POA: Diagnosis not present

## 2017-08-14 DIAGNOSIS — E785 Hyperlipidemia, unspecified: Secondary | ICD-10-CM | POA: Diagnosis not present

## 2017-08-14 DIAGNOSIS — E559 Vitamin D deficiency, unspecified: Secondary | ICD-10-CM | POA: Diagnosis not present

## 2017-08-14 DIAGNOSIS — E538 Deficiency of other specified B group vitamins: Secondary | ICD-10-CM | POA: Diagnosis not present

## 2017-08-29 DIAGNOSIS — Z6824 Body mass index (BMI) 24.0-24.9, adult: Secondary | ICD-10-CM | POA: Diagnosis not present

## 2017-08-29 DIAGNOSIS — I10 Essential (primary) hypertension: Secondary | ICD-10-CM | POA: Diagnosis not present

## 2017-08-29 DIAGNOSIS — E785 Hyperlipidemia, unspecified: Secondary | ICD-10-CM | POA: Diagnosis not present

## 2017-08-29 DIAGNOSIS — Z1339 Encounter for screening examination for other mental health and behavioral disorders: Secondary | ICD-10-CM | POA: Diagnosis not present

## 2017-08-29 DIAGNOSIS — M545 Low back pain: Secondary | ICD-10-CM | POA: Diagnosis not present

## 2017-08-29 DIAGNOSIS — G4733 Obstructive sleep apnea (adult) (pediatric): Secondary | ICD-10-CM | POA: Diagnosis not present

## 2017-08-29 DIAGNOSIS — I2510 Atherosclerotic heart disease of native coronary artery without angina pectoris: Secondary | ICD-10-CM | POA: Diagnosis not present

## 2017-09-03 DIAGNOSIS — M5136 Other intervertebral disc degeneration, lumbar region: Secondary | ICD-10-CM | POA: Diagnosis not present

## 2017-09-03 DIAGNOSIS — M5032 Other cervical disc degeneration, mid-cervical region, unspecified level: Secondary | ICD-10-CM | POA: Diagnosis not present

## 2017-09-03 DIAGNOSIS — S161XXA Strain of muscle, fascia and tendon at neck level, initial encounter: Secondary | ICD-10-CM | POA: Diagnosis not present

## 2017-09-03 DIAGNOSIS — M9901 Segmental and somatic dysfunction of cervical region: Secondary | ICD-10-CM | POA: Diagnosis not present

## 2017-09-03 DIAGNOSIS — M9902 Segmental and somatic dysfunction of thoracic region: Secondary | ICD-10-CM | POA: Diagnosis not present

## 2017-09-03 DIAGNOSIS — S29019A Strain of muscle and tendon of unspecified wall of thorax, initial encounter: Secondary | ICD-10-CM | POA: Diagnosis not present

## 2017-09-03 DIAGNOSIS — M545 Low back pain: Secondary | ICD-10-CM | POA: Diagnosis not present

## 2017-09-03 DIAGNOSIS — M9903 Segmental and somatic dysfunction of lumbar region: Secondary | ICD-10-CM | POA: Diagnosis not present

## 2017-09-03 DIAGNOSIS — M542 Cervicalgia: Secondary | ICD-10-CM | POA: Diagnosis not present

## 2017-09-03 DIAGNOSIS — S39012A Strain of muscle, fascia and tendon of lower back, initial encounter: Secondary | ICD-10-CM | POA: Diagnosis not present

## 2017-09-06 DIAGNOSIS — S39012A Strain of muscle, fascia and tendon of lower back, initial encounter: Secondary | ICD-10-CM | POA: Diagnosis not present

## 2017-09-06 DIAGNOSIS — M9903 Segmental and somatic dysfunction of lumbar region: Secondary | ICD-10-CM | POA: Diagnosis not present

## 2017-09-06 DIAGNOSIS — S29019A Strain of muscle and tendon of unspecified wall of thorax, initial encounter: Secondary | ICD-10-CM | POA: Diagnosis not present

## 2017-09-06 DIAGNOSIS — M545 Low back pain: Secondary | ICD-10-CM | POA: Diagnosis not present

## 2017-09-06 DIAGNOSIS — M5136 Other intervertebral disc degeneration, lumbar region: Secondary | ICD-10-CM | POA: Diagnosis not present

## 2017-09-06 DIAGNOSIS — M9901 Segmental and somatic dysfunction of cervical region: Secondary | ICD-10-CM | POA: Diagnosis not present

## 2017-09-06 DIAGNOSIS — S161XXA Strain of muscle, fascia and tendon at neck level, initial encounter: Secondary | ICD-10-CM | POA: Diagnosis not present

## 2017-09-06 DIAGNOSIS — M9902 Segmental and somatic dysfunction of thoracic region: Secondary | ICD-10-CM | POA: Diagnosis not present

## 2017-09-06 DIAGNOSIS — M5032 Other cervical disc degeneration, mid-cervical region, unspecified level: Secondary | ICD-10-CM | POA: Diagnosis not present

## 2017-09-06 DIAGNOSIS — M542 Cervicalgia: Secondary | ICD-10-CM | POA: Diagnosis not present

## 2017-09-10 DIAGNOSIS — M5032 Other cervical disc degeneration, mid-cervical region, unspecified level: Secondary | ICD-10-CM | POA: Diagnosis not present

## 2017-09-10 DIAGNOSIS — M9903 Segmental and somatic dysfunction of lumbar region: Secondary | ICD-10-CM | POA: Diagnosis not present

## 2017-09-10 DIAGNOSIS — M9902 Segmental and somatic dysfunction of thoracic region: Secondary | ICD-10-CM | POA: Diagnosis not present

## 2017-09-10 DIAGNOSIS — M542 Cervicalgia: Secondary | ICD-10-CM | POA: Diagnosis not present

## 2017-09-10 DIAGNOSIS — M545 Low back pain: Secondary | ICD-10-CM | POA: Diagnosis not present

## 2017-09-10 DIAGNOSIS — S39012A Strain of muscle, fascia and tendon of lower back, initial encounter: Secondary | ICD-10-CM | POA: Diagnosis not present

## 2017-09-10 DIAGNOSIS — S29019A Strain of muscle and tendon of unspecified wall of thorax, initial encounter: Secondary | ICD-10-CM | POA: Diagnosis not present

## 2017-09-10 DIAGNOSIS — S161XXA Strain of muscle, fascia and tendon at neck level, initial encounter: Secondary | ICD-10-CM | POA: Diagnosis not present

## 2017-09-10 DIAGNOSIS — M9901 Segmental and somatic dysfunction of cervical region: Secondary | ICD-10-CM | POA: Diagnosis not present

## 2017-09-10 DIAGNOSIS — M5136 Other intervertebral disc degeneration, lumbar region: Secondary | ICD-10-CM | POA: Diagnosis not present

## 2017-09-12 DIAGNOSIS — M545 Low back pain: Secondary | ICD-10-CM | POA: Diagnosis not present

## 2017-09-12 DIAGNOSIS — M9902 Segmental and somatic dysfunction of thoracic region: Secondary | ICD-10-CM | POA: Diagnosis not present

## 2017-09-12 DIAGNOSIS — S161XXA Strain of muscle, fascia and tendon at neck level, initial encounter: Secondary | ICD-10-CM | POA: Diagnosis not present

## 2017-09-12 DIAGNOSIS — S29019A Strain of muscle and tendon of unspecified wall of thorax, initial encounter: Secondary | ICD-10-CM | POA: Diagnosis not present

## 2017-09-12 DIAGNOSIS — S39012A Strain of muscle, fascia and tendon of lower back, initial encounter: Secondary | ICD-10-CM | POA: Diagnosis not present

## 2017-09-12 DIAGNOSIS — M4316 Spondylolisthesis, lumbar region: Secondary | ICD-10-CM | POA: Diagnosis not present

## 2017-09-12 DIAGNOSIS — M9903 Segmental and somatic dysfunction of lumbar region: Secondary | ICD-10-CM | POA: Diagnosis not present

## 2017-09-12 DIAGNOSIS — M5032 Other cervical disc degeneration, mid-cervical region, unspecified level: Secondary | ICD-10-CM | POA: Diagnosis not present

## 2017-09-12 DIAGNOSIS — M9901 Segmental and somatic dysfunction of cervical region: Secondary | ICD-10-CM | POA: Diagnosis not present

## 2017-09-12 DIAGNOSIS — M5136 Other intervertebral disc degeneration, lumbar region: Secondary | ICD-10-CM | POA: Diagnosis not present

## 2017-09-14 DIAGNOSIS — M5032 Other cervical disc degeneration, mid-cervical region, unspecified level: Secondary | ICD-10-CM | POA: Diagnosis not present

## 2017-09-14 DIAGNOSIS — M9901 Segmental and somatic dysfunction of cervical region: Secondary | ICD-10-CM | POA: Diagnosis not present

## 2017-09-14 DIAGNOSIS — S39012A Strain of muscle, fascia and tendon of lower back, initial encounter: Secondary | ICD-10-CM | POA: Diagnosis not present

## 2017-09-14 DIAGNOSIS — M4316 Spondylolisthesis, lumbar region: Secondary | ICD-10-CM | POA: Diagnosis not present

## 2017-09-14 DIAGNOSIS — S29019A Strain of muscle and tendon of unspecified wall of thorax, initial encounter: Secondary | ICD-10-CM | POA: Diagnosis not present

## 2017-09-14 DIAGNOSIS — M9902 Segmental and somatic dysfunction of thoracic region: Secondary | ICD-10-CM | POA: Diagnosis not present

## 2017-09-14 DIAGNOSIS — M5136 Other intervertebral disc degeneration, lumbar region: Secondary | ICD-10-CM | POA: Diagnosis not present

## 2017-09-14 DIAGNOSIS — S161XXA Strain of muscle, fascia and tendon at neck level, initial encounter: Secondary | ICD-10-CM | POA: Diagnosis not present

## 2017-09-14 DIAGNOSIS — M545 Low back pain: Secondary | ICD-10-CM | POA: Diagnosis not present

## 2017-09-14 DIAGNOSIS — M9903 Segmental and somatic dysfunction of lumbar region: Secondary | ICD-10-CM | POA: Diagnosis not present

## 2017-09-17 DIAGNOSIS — M5136 Other intervertebral disc degeneration, lumbar region: Secondary | ICD-10-CM | POA: Diagnosis not present

## 2017-09-17 DIAGNOSIS — S29019A Strain of muscle and tendon of unspecified wall of thorax, initial encounter: Secondary | ICD-10-CM | POA: Diagnosis not present

## 2017-09-17 DIAGNOSIS — M545 Low back pain: Secondary | ICD-10-CM | POA: Diagnosis not present

## 2017-09-17 DIAGNOSIS — M4316 Spondylolisthesis, lumbar region: Secondary | ICD-10-CM | POA: Diagnosis not present

## 2017-09-17 DIAGNOSIS — M9903 Segmental and somatic dysfunction of lumbar region: Secondary | ICD-10-CM | POA: Diagnosis not present

## 2017-09-17 DIAGNOSIS — M9901 Segmental and somatic dysfunction of cervical region: Secondary | ICD-10-CM | POA: Diagnosis not present

## 2017-09-17 DIAGNOSIS — S161XXA Strain of muscle, fascia and tendon at neck level, initial encounter: Secondary | ICD-10-CM | POA: Diagnosis not present

## 2017-09-17 DIAGNOSIS — M5032 Other cervical disc degeneration, mid-cervical region, unspecified level: Secondary | ICD-10-CM | POA: Diagnosis not present

## 2017-09-17 DIAGNOSIS — M9902 Segmental and somatic dysfunction of thoracic region: Secondary | ICD-10-CM | POA: Diagnosis not present

## 2017-09-17 DIAGNOSIS — S39012A Strain of muscle, fascia and tendon of lower back, initial encounter: Secondary | ICD-10-CM | POA: Diagnosis not present

## 2017-09-19 DIAGNOSIS — M9901 Segmental and somatic dysfunction of cervical region: Secondary | ICD-10-CM | POA: Diagnosis not present

## 2017-09-19 DIAGNOSIS — S29019A Strain of muscle and tendon of unspecified wall of thorax, initial encounter: Secondary | ICD-10-CM | POA: Diagnosis not present

## 2017-09-19 DIAGNOSIS — M4316 Spondylolisthesis, lumbar region: Secondary | ICD-10-CM | POA: Diagnosis not present

## 2017-09-19 DIAGNOSIS — M9903 Segmental and somatic dysfunction of lumbar region: Secondary | ICD-10-CM | POA: Diagnosis not present

## 2017-09-19 DIAGNOSIS — M545 Low back pain: Secondary | ICD-10-CM | POA: Diagnosis not present

## 2017-09-19 DIAGNOSIS — S161XXA Strain of muscle, fascia and tendon at neck level, initial encounter: Secondary | ICD-10-CM | POA: Diagnosis not present

## 2017-09-19 DIAGNOSIS — M5136 Other intervertebral disc degeneration, lumbar region: Secondary | ICD-10-CM | POA: Diagnosis not present

## 2017-09-19 DIAGNOSIS — S39012A Strain of muscle, fascia and tendon of lower back, initial encounter: Secondary | ICD-10-CM | POA: Diagnosis not present

## 2017-09-19 DIAGNOSIS — M9902 Segmental and somatic dysfunction of thoracic region: Secondary | ICD-10-CM | POA: Diagnosis not present

## 2017-09-19 DIAGNOSIS — M5032 Other cervical disc degeneration, mid-cervical region, unspecified level: Secondary | ICD-10-CM | POA: Diagnosis not present

## 2017-09-20 DIAGNOSIS — S39012A Strain of muscle, fascia and tendon of lower back, initial encounter: Secondary | ICD-10-CM | POA: Diagnosis not present

## 2017-09-20 DIAGNOSIS — S161XXA Strain of muscle, fascia and tendon at neck level, initial encounter: Secondary | ICD-10-CM | POA: Diagnosis not present

## 2017-09-20 DIAGNOSIS — M9902 Segmental and somatic dysfunction of thoracic region: Secondary | ICD-10-CM | POA: Diagnosis not present

## 2017-09-20 DIAGNOSIS — M545 Low back pain: Secondary | ICD-10-CM | POA: Diagnosis not present

## 2017-09-20 DIAGNOSIS — M4316 Spondylolisthesis, lumbar region: Secondary | ICD-10-CM | POA: Diagnosis not present

## 2017-09-20 DIAGNOSIS — M5136 Other intervertebral disc degeneration, lumbar region: Secondary | ICD-10-CM | POA: Diagnosis not present

## 2017-09-20 DIAGNOSIS — S29019A Strain of muscle and tendon of unspecified wall of thorax, initial encounter: Secondary | ICD-10-CM | POA: Diagnosis not present

## 2017-09-20 DIAGNOSIS — M5032 Other cervical disc degeneration, mid-cervical region, unspecified level: Secondary | ICD-10-CM | POA: Diagnosis not present

## 2017-09-20 DIAGNOSIS — M9903 Segmental and somatic dysfunction of lumbar region: Secondary | ICD-10-CM | POA: Diagnosis not present

## 2017-09-20 DIAGNOSIS — M9901 Segmental and somatic dysfunction of cervical region: Secondary | ICD-10-CM | POA: Diagnosis not present

## 2017-09-24 DIAGNOSIS — S29019A Strain of muscle and tendon of unspecified wall of thorax, initial encounter: Secondary | ICD-10-CM | POA: Diagnosis not present

## 2017-09-24 DIAGNOSIS — M5032 Other cervical disc degeneration, mid-cervical region, unspecified level: Secondary | ICD-10-CM | POA: Diagnosis not present

## 2017-09-24 DIAGNOSIS — M9902 Segmental and somatic dysfunction of thoracic region: Secondary | ICD-10-CM | POA: Diagnosis not present

## 2017-09-24 DIAGNOSIS — M545 Low back pain: Secondary | ICD-10-CM | POA: Diagnosis not present

## 2017-09-24 DIAGNOSIS — M9903 Segmental and somatic dysfunction of lumbar region: Secondary | ICD-10-CM | POA: Diagnosis not present

## 2017-09-24 DIAGNOSIS — S161XXA Strain of muscle, fascia and tendon at neck level, initial encounter: Secondary | ICD-10-CM | POA: Diagnosis not present

## 2017-09-24 DIAGNOSIS — M9901 Segmental and somatic dysfunction of cervical region: Secondary | ICD-10-CM | POA: Diagnosis not present

## 2017-09-24 DIAGNOSIS — M4316 Spondylolisthesis, lumbar region: Secondary | ICD-10-CM | POA: Diagnosis not present

## 2017-09-24 DIAGNOSIS — M5136 Other intervertebral disc degeneration, lumbar region: Secondary | ICD-10-CM | POA: Diagnosis not present

## 2017-09-24 DIAGNOSIS — S39012A Strain of muscle, fascia and tendon of lower back, initial encounter: Secondary | ICD-10-CM | POA: Diagnosis not present

## 2017-09-26 DIAGNOSIS — S39012A Strain of muscle, fascia and tendon of lower back, initial encounter: Secondary | ICD-10-CM | POA: Diagnosis not present

## 2017-09-26 DIAGNOSIS — M545 Low back pain: Secondary | ICD-10-CM | POA: Diagnosis not present

## 2017-09-26 DIAGNOSIS — S161XXA Strain of muscle, fascia and tendon at neck level, initial encounter: Secondary | ICD-10-CM | POA: Diagnosis not present

## 2017-09-26 DIAGNOSIS — M9901 Segmental and somatic dysfunction of cervical region: Secondary | ICD-10-CM | POA: Diagnosis not present

## 2017-09-26 DIAGNOSIS — M9903 Segmental and somatic dysfunction of lumbar region: Secondary | ICD-10-CM | POA: Diagnosis not present

## 2017-09-26 DIAGNOSIS — M4316 Spondylolisthesis, lumbar region: Secondary | ICD-10-CM | POA: Diagnosis not present

## 2017-09-26 DIAGNOSIS — M5032 Other cervical disc degeneration, mid-cervical region, unspecified level: Secondary | ICD-10-CM | POA: Diagnosis not present

## 2017-09-26 DIAGNOSIS — S29019A Strain of muscle and tendon of unspecified wall of thorax, initial encounter: Secondary | ICD-10-CM | POA: Diagnosis not present

## 2017-09-26 DIAGNOSIS — M5136 Other intervertebral disc degeneration, lumbar region: Secondary | ICD-10-CM | POA: Diagnosis not present

## 2017-09-26 DIAGNOSIS — M9902 Segmental and somatic dysfunction of thoracic region: Secondary | ICD-10-CM | POA: Diagnosis not present

## 2017-10-01 DIAGNOSIS — S29019A Strain of muscle and tendon of unspecified wall of thorax, initial encounter: Secondary | ICD-10-CM | POA: Diagnosis not present

## 2017-10-01 DIAGNOSIS — M5136 Other intervertebral disc degeneration, lumbar region: Secondary | ICD-10-CM | POA: Diagnosis not present

## 2017-10-01 DIAGNOSIS — M9902 Segmental and somatic dysfunction of thoracic region: Secondary | ICD-10-CM | POA: Diagnosis not present

## 2017-10-01 DIAGNOSIS — M9903 Segmental and somatic dysfunction of lumbar region: Secondary | ICD-10-CM | POA: Diagnosis not present

## 2017-10-01 DIAGNOSIS — S161XXA Strain of muscle, fascia and tendon at neck level, initial encounter: Secondary | ICD-10-CM | POA: Diagnosis not present

## 2017-10-01 DIAGNOSIS — S39012A Strain of muscle, fascia and tendon of lower back, initial encounter: Secondary | ICD-10-CM | POA: Diagnosis not present

## 2017-10-01 DIAGNOSIS — M5032 Other cervical disc degeneration, mid-cervical region, unspecified level: Secondary | ICD-10-CM | POA: Diagnosis not present

## 2017-10-01 DIAGNOSIS — M9901 Segmental and somatic dysfunction of cervical region: Secondary | ICD-10-CM | POA: Diagnosis not present

## 2017-10-01 DIAGNOSIS — M4316 Spondylolisthesis, lumbar region: Secondary | ICD-10-CM | POA: Diagnosis not present

## 2017-10-01 DIAGNOSIS — M545 Low back pain: Secondary | ICD-10-CM | POA: Diagnosis not present

## 2017-10-03 DIAGNOSIS — S161XXA Strain of muscle, fascia and tendon at neck level, initial encounter: Secondary | ICD-10-CM | POA: Diagnosis not present

## 2017-10-03 DIAGNOSIS — M9902 Segmental and somatic dysfunction of thoracic region: Secondary | ICD-10-CM | POA: Diagnosis not present

## 2017-10-03 DIAGNOSIS — L820 Inflamed seborrheic keratosis: Secondary | ICD-10-CM | POA: Diagnosis not present

## 2017-10-03 DIAGNOSIS — D225 Melanocytic nevi of trunk: Secondary | ICD-10-CM | POA: Diagnosis not present

## 2017-10-03 DIAGNOSIS — S39012A Strain of muscle, fascia and tendon of lower back, initial encounter: Secondary | ICD-10-CM | POA: Diagnosis not present

## 2017-10-03 DIAGNOSIS — M9903 Segmental and somatic dysfunction of lumbar region: Secondary | ICD-10-CM | POA: Diagnosis not present

## 2017-10-03 DIAGNOSIS — M5032 Other cervical disc degeneration, mid-cervical region, unspecified level: Secondary | ICD-10-CM | POA: Diagnosis not present

## 2017-10-03 DIAGNOSIS — M9901 Segmental and somatic dysfunction of cervical region: Secondary | ICD-10-CM | POA: Diagnosis not present

## 2017-10-03 DIAGNOSIS — M4316 Spondylolisthesis, lumbar region: Secondary | ICD-10-CM | POA: Diagnosis not present

## 2017-10-03 DIAGNOSIS — M5136 Other intervertebral disc degeneration, lumbar region: Secondary | ICD-10-CM | POA: Diagnosis not present

## 2017-10-03 DIAGNOSIS — S29019A Strain of muscle and tendon of unspecified wall of thorax, initial encounter: Secondary | ICD-10-CM | POA: Diagnosis not present

## 2017-10-03 DIAGNOSIS — L814 Other melanin hyperpigmentation: Secondary | ICD-10-CM | POA: Diagnosis not present

## 2017-10-03 DIAGNOSIS — D2239 Melanocytic nevi of other parts of face: Secondary | ICD-10-CM | POA: Diagnosis not present

## 2017-10-03 DIAGNOSIS — M545 Low back pain: Secondary | ICD-10-CM | POA: Diagnosis not present

## 2017-10-08 DIAGNOSIS — M5032 Other cervical disc degeneration, mid-cervical region, unspecified level: Secondary | ICD-10-CM | POA: Diagnosis not present

## 2017-10-08 DIAGNOSIS — M5441 Lumbago with sciatica, right side: Secondary | ICD-10-CM | POA: Diagnosis not present

## 2017-10-08 DIAGNOSIS — M9902 Segmental and somatic dysfunction of thoracic region: Secondary | ICD-10-CM | POA: Diagnosis not present

## 2017-10-08 DIAGNOSIS — M9905 Segmental and somatic dysfunction of pelvic region: Secondary | ICD-10-CM | POA: Diagnosis not present

## 2017-10-08 DIAGNOSIS — M9901 Segmental and somatic dysfunction of cervical region: Secondary | ICD-10-CM | POA: Diagnosis not present

## 2017-10-08 DIAGNOSIS — M9903 Segmental and somatic dysfunction of lumbar region: Secondary | ICD-10-CM | POA: Diagnosis not present

## 2017-10-08 DIAGNOSIS — M5136 Other intervertebral disc degeneration, lumbar region: Secondary | ICD-10-CM | POA: Diagnosis not present

## 2017-10-10 DIAGNOSIS — M9901 Segmental and somatic dysfunction of cervical region: Secondary | ICD-10-CM | POA: Diagnosis not present

## 2017-10-10 DIAGNOSIS — M5441 Lumbago with sciatica, right side: Secondary | ICD-10-CM | POA: Diagnosis not present

## 2017-10-10 DIAGNOSIS — M5032 Other cervical disc degeneration, mid-cervical region, unspecified level: Secondary | ICD-10-CM | POA: Diagnosis not present

## 2017-10-10 DIAGNOSIS — M9902 Segmental and somatic dysfunction of thoracic region: Secondary | ICD-10-CM | POA: Diagnosis not present

## 2017-10-10 DIAGNOSIS — M9903 Segmental and somatic dysfunction of lumbar region: Secondary | ICD-10-CM | POA: Diagnosis not present

## 2017-10-10 DIAGNOSIS — M9905 Segmental and somatic dysfunction of pelvic region: Secondary | ICD-10-CM | POA: Diagnosis not present

## 2017-10-10 DIAGNOSIS — M5136 Other intervertebral disc degeneration, lumbar region: Secondary | ICD-10-CM | POA: Diagnosis not present

## 2017-10-16 DIAGNOSIS — M9901 Segmental and somatic dysfunction of cervical region: Secondary | ICD-10-CM | POA: Diagnosis not present

## 2017-10-16 DIAGNOSIS — M5032 Other cervical disc degeneration, mid-cervical region, unspecified level: Secondary | ICD-10-CM | POA: Diagnosis not present

## 2017-10-16 DIAGNOSIS — M5441 Lumbago with sciatica, right side: Secondary | ICD-10-CM | POA: Diagnosis not present

## 2017-10-16 DIAGNOSIS — M9902 Segmental and somatic dysfunction of thoracic region: Secondary | ICD-10-CM | POA: Diagnosis not present

## 2017-10-16 DIAGNOSIS — M9905 Segmental and somatic dysfunction of pelvic region: Secondary | ICD-10-CM | POA: Diagnosis not present

## 2017-10-16 DIAGNOSIS — M5136 Other intervertebral disc degeneration, lumbar region: Secondary | ICD-10-CM | POA: Diagnosis not present

## 2017-10-16 DIAGNOSIS — M9903 Segmental and somatic dysfunction of lumbar region: Secondary | ICD-10-CM | POA: Diagnosis not present

## 2017-10-17 DIAGNOSIS — R69 Illness, unspecified: Secondary | ICD-10-CM | POA: Diagnosis not present

## 2017-10-22 DIAGNOSIS — M9901 Segmental and somatic dysfunction of cervical region: Secondary | ICD-10-CM | POA: Diagnosis not present

## 2017-10-22 DIAGNOSIS — M5441 Lumbago with sciatica, right side: Secondary | ICD-10-CM | POA: Diagnosis not present

## 2017-10-22 DIAGNOSIS — M9902 Segmental and somatic dysfunction of thoracic region: Secondary | ICD-10-CM | POA: Diagnosis not present

## 2017-10-22 DIAGNOSIS — M5032 Other cervical disc degeneration, mid-cervical region, unspecified level: Secondary | ICD-10-CM | POA: Diagnosis not present

## 2017-10-22 DIAGNOSIS — M9903 Segmental and somatic dysfunction of lumbar region: Secondary | ICD-10-CM | POA: Diagnosis not present

## 2017-10-22 DIAGNOSIS — M5136 Other intervertebral disc degeneration, lumbar region: Secondary | ICD-10-CM | POA: Diagnosis not present

## 2017-10-22 DIAGNOSIS — M9905 Segmental and somatic dysfunction of pelvic region: Secondary | ICD-10-CM | POA: Diagnosis not present

## 2017-10-29 DIAGNOSIS — M5032 Other cervical disc degeneration, mid-cervical region, unspecified level: Secondary | ICD-10-CM | POA: Diagnosis not present

## 2017-10-29 DIAGNOSIS — M5441 Lumbago with sciatica, right side: Secondary | ICD-10-CM | POA: Diagnosis not present

## 2017-10-29 DIAGNOSIS — M5136 Other intervertebral disc degeneration, lumbar region: Secondary | ICD-10-CM | POA: Diagnosis not present

## 2017-10-29 DIAGNOSIS — M9901 Segmental and somatic dysfunction of cervical region: Secondary | ICD-10-CM | POA: Diagnosis not present

## 2017-10-29 DIAGNOSIS — M9902 Segmental and somatic dysfunction of thoracic region: Secondary | ICD-10-CM | POA: Diagnosis not present

## 2017-10-29 DIAGNOSIS — M9903 Segmental and somatic dysfunction of lumbar region: Secondary | ICD-10-CM | POA: Diagnosis not present

## 2017-10-29 DIAGNOSIS — M9905 Segmental and somatic dysfunction of pelvic region: Secondary | ICD-10-CM | POA: Diagnosis not present

## 2017-11-05 DIAGNOSIS — M5136 Other intervertebral disc degeneration, lumbar region: Secondary | ICD-10-CM | POA: Diagnosis not present

## 2017-11-05 DIAGNOSIS — M9903 Segmental and somatic dysfunction of lumbar region: Secondary | ICD-10-CM | POA: Diagnosis not present

## 2017-11-05 DIAGNOSIS — M9905 Segmental and somatic dysfunction of pelvic region: Secondary | ICD-10-CM | POA: Diagnosis not present

## 2017-11-05 DIAGNOSIS — M5032 Other cervical disc degeneration, mid-cervical region, unspecified level: Secondary | ICD-10-CM | POA: Diagnosis not present

## 2017-11-05 DIAGNOSIS — M5441 Lumbago with sciatica, right side: Secondary | ICD-10-CM | POA: Diagnosis not present

## 2017-11-05 DIAGNOSIS — M9901 Segmental and somatic dysfunction of cervical region: Secondary | ICD-10-CM | POA: Diagnosis not present

## 2017-11-05 DIAGNOSIS — M9902 Segmental and somatic dysfunction of thoracic region: Secondary | ICD-10-CM | POA: Diagnosis not present

## 2017-11-19 DIAGNOSIS — M5032 Other cervical disc degeneration, mid-cervical region, unspecified level: Secondary | ICD-10-CM | POA: Diagnosis not present

## 2017-11-19 DIAGNOSIS — M9903 Segmental and somatic dysfunction of lumbar region: Secondary | ICD-10-CM | POA: Diagnosis not present

## 2017-11-19 DIAGNOSIS — M9905 Segmental and somatic dysfunction of pelvic region: Secondary | ICD-10-CM | POA: Diagnosis not present

## 2017-11-19 DIAGNOSIS — M9902 Segmental and somatic dysfunction of thoracic region: Secondary | ICD-10-CM | POA: Diagnosis not present

## 2017-11-19 DIAGNOSIS — M5136 Other intervertebral disc degeneration, lumbar region: Secondary | ICD-10-CM | POA: Diagnosis not present

## 2017-11-19 DIAGNOSIS — M9901 Segmental and somatic dysfunction of cervical region: Secondary | ICD-10-CM | POA: Diagnosis not present

## 2017-11-19 DIAGNOSIS — M5441 Lumbago with sciatica, right side: Secondary | ICD-10-CM | POA: Diagnosis not present

## 2017-11-28 DIAGNOSIS — M5416 Radiculopathy, lumbar region: Secondary | ICD-10-CM | POA: Diagnosis not present

## 2017-11-28 DIAGNOSIS — M4310 Spondylolisthesis, site unspecified: Secondary | ICD-10-CM | POA: Diagnosis not present

## 2017-12-04 ENCOUNTER — Other Ambulatory Visit: Payer: Self-pay | Admitting: Neurosurgery

## 2017-12-04 DIAGNOSIS — M4310 Spondylolisthesis, site unspecified: Secondary | ICD-10-CM

## 2017-12-09 ENCOUNTER — Ambulatory Visit
Admission: RE | Admit: 2017-12-09 | Discharge: 2017-12-09 | Disposition: A | Discharge status: Home / Self Care | Payer: Medicare HMO | Source: Ambulatory Visit | Attending: Neurosurgery | Admitting: Neurosurgery

## 2017-12-09 DIAGNOSIS — M48061 Spinal stenosis, lumbar region without neurogenic claudication: Secondary | ICD-10-CM | POA: Diagnosis not present

## 2017-12-09 DIAGNOSIS — M4310 Spondylolisthesis, site unspecified: Secondary | ICD-10-CM

## 2017-12-13 ENCOUNTER — Other Ambulatory Visit: Payer: Self-pay | Admitting: Neurosurgery

## 2017-12-13 DIAGNOSIS — M4310 Spondylolisthesis, site unspecified: Secondary | ICD-10-CM | POA: Diagnosis not present

## 2017-12-13 DIAGNOSIS — M5416 Radiculopathy, lumbar region: Secondary | ICD-10-CM | POA: Diagnosis not present

## 2017-12-13 DIAGNOSIS — M5126 Other intervertebral disc displacement, lumbar region: Secondary | ICD-10-CM | POA: Diagnosis not present

## 2017-12-14 DIAGNOSIS — M47816 Spondylosis without myelopathy or radiculopathy, lumbar region: Secondary | ICD-10-CM

## 2017-12-14 HISTORY — DX: Spondylosis without myelopathy or radiculopathy, lumbar region: M47.816

## 2017-12-18 NOTE — Pre-Procedure Instructions (Signed)
KAYDYNCE PAT  12/18/2017      [facility], [place] - [address] [place] [address] Phone: [phone] Fax: [phone]    Your procedure is scheduled on Monday, November 11.  Report to [facility] Admitting at 6:00  AM                  Your surgery or procedure is scheduled for 8:00 A.M.   Call this number if you have problems the morning of surgery: [phone]  This is the number for the Pre- Surgical Desk.    Remember:  Do not eat or drink after midnight Sunday, November 1 .   Take these medicines the morning of surgery with A SIP OF WATER:  If needed: acetaminophen (TYLENOL)  Aspirin- continue or hold as instructed by Dr. Annette Stable  STOP taking Aspirin Products (Goody Powder, Excedrin Migraine), Ibuprofen (Advil), Naproxen (Aleve), Vitamins and Herbal Products (ie Fish Oil)   Special instructions:   [place]- Preparing For Surgery  Before surgery, you can play an important role. Because skin is not sterile, your skin needs to be as free of germs as possible. You can reduce the number of germs on your skin by washing with CHG (chlorahexidine gluconate) Soap before surgery.  CHG is an antiseptic cleaner which kills germs and bonds with the skin to continue killing germs even after washing.    Oral Hygiene is also important to reduce your risk of infection.  Remember - BRUSH YOUR TEETH THE MORNING OF SURGERY WITH YOUR REGULAR TOOTHPASTE  Please do not use if you have an allergy to CHG or antibacterial soaps. If your skin becomes reddened/irritated stop using the CHG.  Do not shave (including legs and underarms) for at least 48 hours prior to first CHG shower. It is OK to shave your face.  Please follow these instructions carefully.   1. Shower the NIGHT BEFORE SURGERY and the MORNING OF SURGERY with CHG.   2. If you chose to wash your hair, wash your hair first as usual with your normal  shampoo.  3. After you shampoo, wash your face and private area with the soap you use at home, then rinseyour hair and body thoroughly to remove the shampoo and soap.  4. Use CHG as you would any other liquid soap. You can apply CHG directly to the skin and wash gently with a scrungie or a clean washcloth.   5. Apply the CHG Soap to your body ONLY FROM THE NECK DOWN.  Do not use on open wounds or open sores. Avoid contact with your eyes, ears, mouth and genitals (private parts).   6. Wash thoroughly, paying special attention to the area where your surgery will be performed.  7. Thoroughly rinse your body with warm water from the neck down.  8. DO NOT shower/wash with your normal soap after using and rinsing off the CHG Soap.  9. Pat yourself dry with a CLEAN TOWEL.  10. Wear CLEAN PAJAMAS to bed the night before surgery, wear comfortable clothes the morning of surgery  11. Place CLEAN SHEETS on your bed the night of your first shower and DO NOT SLEEP WITH PETS.   Day of Surgery: Shower as written above  Do not apply any deodorants/lotions, powders or colognes.  Please wear clean clothes to the hospital/surgery center.   Remember to brush your teeth WITH YOUR REGULAR TOOTHPASTE.  Do  not wear jewelry, make-up or nail polish.  Do not shave 48 hours prior to surgery.  Men may shave face and neck.  Do not bring valuables to the hospital.  [facility] is not responsible for any belongings or valuables.  Contacts, dentures or bridgework may not be worn into surgery.  Leave your suitcase in the car.  After surgery it may be brought to your room.  For patients admitted to the hospital, discharge time will be determined by your treatment team.  Please read over the following fact sheets that you were given.

## 2017-12-19 ENCOUNTER — Encounter (HOSPITAL_COMMUNITY)
Admission: RE | Admit: 2017-12-19 | Discharge: 2017-12-19 | Disposition: A | Discharge status: Home / Self Care | Payer: Medicare HMO | Source: Ambulatory Visit | Attending: Neurosurgery | Admitting: Neurosurgery

## 2017-12-19 ENCOUNTER — Other Ambulatory Visit: Payer: Self-pay

## 2017-12-19 ENCOUNTER — Encounter (HOSPITAL_COMMUNITY): Payer: Self-pay

## 2017-12-19 DIAGNOSIS — Z79899 Other long term (current) drug therapy: Secondary | ICD-10-CM | POA: Diagnosis not present

## 2017-12-19 DIAGNOSIS — Z01818 Encounter for other preprocedural examination: Secondary | ICD-10-CM | POA: Diagnosis not present

## 2017-12-19 DIAGNOSIS — I2510 Atherosclerotic heart disease of native coronary artery without angina pectoris: Secondary | ICD-10-CM | POA: Diagnosis not present

## 2017-12-19 DIAGNOSIS — Z7982 Long term (current) use of aspirin: Secondary | ICD-10-CM | POA: Insufficient documentation

## 2017-12-19 DIAGNOSIS — I10 Essential (primary) hypertension: Secondary | ICD-10-CM | POA: Insufficient documentation

## 2017-12-19 DIAGNOSIS — M4316 Spondylolisthesis, lumbar region: Secondary | ICD-10-CM | POA: Insufficient documentation

## 2017-12-19 LAB — CBC WITH DIFFERENTIAL/PLATELET
Abs Immature Granulocytes: 0.01 10*3/uL (ref 0.00–0.07)
BASOS ABS: 0 10*3/uL (ref 0.0–0.1)
Basophils Relative: 1 %
EOS ABS: 0.2 10*3/uL (ref 0.0–0.5)
Eosinophils Relative: 4 %
HCT: 42.3 % (ref 36.0–46.0)
HEMOGLOBIN: 13.4 g/dL (ref 12.0–15.0)
Immature Granulocytes: 0 %
LYMPHS ABS: 1.4 10*3/uL (ref 0.7–4.0)
LYMPHS PCT: 26 %
MCH: 29.9 pg (ref 26.0–34.0)
MCHC: 31.7 g/dL (ref 30.0–36.0)
MCV: 94.4 fL (ref 80.0–100.0)
Monocytes Absolute: 0.4 10*3/uL (ref 0.1–1.0)
Monocytes Relative: 7 %
NEUTROS ABS: 3.3 10*3/uL (ref 1.7–7.7)
NEUTROS PCT: 62 %
NRBC: 0 % (ref 0.0–0.2)
PLATELETS: 249 10*3/uL (ref 150–400)
RBC: 4.48 MIL/uL (ref 3.87–5.11)
RDW: 12.5 % (ref 11.5–15.5)
WBC: 5.4 10*3/uL (ref 4.0–10.5)

## 2017-12-19 LAB — BASIC METABOLIC PANEL
Anion gap: 9 (ref 5–15)
BUN: 8 mg/dL (ref 8–23)
CHLORIDE: 101 mmol/L (ref 98–111)
CO2: 23 mmol/L (ref 22–32)
CREATININE: 0.76 mg/dL (ref 0.44–1.00)
Calcium: 9.1 mg/dL (ref 8.9–10.3)
GFR calc Af Amer: >60 mL/min (ref >60)
Glucose, Bld: 96 mg/dL (ref 70–99)
Potassium: 3.3 mmol/L — ABNORMAL LOW (ref 3.5–5.1)
SODIUM: 133 mmol/L — AB (ref 135–145)

## 2017-12-19 LAB — SURGICAL PCR SCREEN
MRSA, PCR: NEGATIVE
Staphylococcus aureus: NEGATIVE

## 2017-12-20 NOTE — Anesthesia Preprocedure Evaluation (Addendum)
Anesthesia Evaluation  Patient identified by MRN, date of birth, ID band Patient awake    Reviewed: Allergy & Precautions, NPO status , Patient's Chart, lab work & pertinent test results  Airway Mallampati: II  TM Distance: >3 FB     Dental  (+) Dental Advisory Given, Teeth Intact   Pulmonary neg pulmonary ROS,    breath sounds clear to auscultation       Cardiovascular hypertension, + CAD and + DOE   Rhythm:Regular Rate:Normal     Neuro/Psych    GI/Hepatic negative GI ROS, Neg liver ROS,   Endo/Other  negative endocrine ROS  Renal/GU negative Renal ROS     Musculoskeletal  (+) Arthritis ,   Abdominal   Peds  Hematology   Anesthesia Other Findings   Reproductive/Obstetrics                          Anesthesia Physical Anesthesia Plan  ASA: III  Anesthesia Plan: General   Post-op Pain Management:    Induction: Intravenous  PONV Risk Score and Plan: Ondansetron, Dexamethasone and Midazolam  Airway Management Planned: Oral ETT  Additional Equipment:   Intra-op Plan:   Post-operative Plan: Possible Post-op intubation/ventilation  Informed Consent: I have reviewed the patients History and Physical, chart, labs and discussed the procedure including the risks, benefits and alternatives for the proposed anesthesia with the patient or authorized representative who has indicated his/her understanding and acceptance.   Dental advisory given  Plan Discussed with: CRNA and Anesthesiologist  Anesthesia Plan Comments: (See PAT note 12/19/2017 by Karoline Caldwell, PA-C )       Anesthesia Quick Evaluation

## 2017-12-20 NOTE — Progress Notes (Signed)
Anesthesia Chart Review:  Case:  [id number] Date/Time:  12/24/17 0745   Procedure:  PLIF - L3-L4 - L4-L5 - Posterior Lateral and Interbody fusion (N/A Back)   Anesthesia type:  General   Pre-op diagnosis:  Spondylolisthesis   Location:  [facility] OR ROOM 21 / [facility]   Surgeon:  Earnie Larsson, MD      DISCUSSION: 83 yo female never smoker. Pertinent hx includes HTN, Nonobstructive CAD (by cath 2008 per Dr. Allison Quarry notes), Thymoma s/p resection in 2007.  Pt was seen by Dr. Ellyn Hack in 2018 for eval of dizziness, near syncope, and chest discomfort. She had previously followed with Dr. Geraldo Pitter for nonobstructive CAD. Cardiac workup by Dr, Ellyn Hack included Myoview that was low risk and a normal echo. Dr. Ellyn Hack posited her symptoms were likely related to deconditioning due to recovering from pneumonia.   In the absence of any CV symptoms I anticipate she can proceed as planned barring acute status change.  VS: BP (!) 150/84 Comment: RECHECKED MANUALLY  Pulse 61   Temp 36.7 C   Resp 20   Ht 5\' 5"  (1.651 m)   Wt 64.2 kg   LMP  (LMP Unknown)   SpO2 100%   BMI 23.55 kg/m   PROVIDERS: Randel Books, FNP is PCP  Glenetta Hew, MD is Cardiologist last seen 07/05/2016  LABS: Labs reviewed: Acceptable for surgery. (all labs ordered are listed, but only abnormal results are displayed)  Labs Reviewed  BASIC METABOLIC PANEL - Abnormal; Notable for the following components:      Result Value   Sodium 133 (*)    Potassium 3.3 (*)    All other components within normal limits  SURGICAL PCR SCREEN  CBC WITH DIFFERENTIAL/PLATELET  TYPE AND SCREEN     EKG: 12/19/2017: Normal sinus rhythm. Rate 63. Left axis deviation. Nonspecific ST and T wave abnormality  CV: Lexiscan Myoview 06/23/2016:  Nuclear stress EF: 69%.  Normal perfusion  This is a low risk study.  TTE 06/19/2016: Study Conclusions  - Left ventricle: The cavity size was normal. Wall thickness was   increased in a pattern of  mild LVH. Systolic function was normal.   The estimated ejection fraction was in the range of 55% to 60%.   Wall motion was normal; there were no regional wall motion   abnormalities. Doppler parameters are consistent with abnormal   left ventricular relaxation (grade 1 diastolic dysfunction). - Aortic valve: There was trivial regurgitation.  Impressions:  - Normal LV systolic function; mild diastolic dysfunction; mild   LVH; trace AI, MR and TR.  Past Medical History:  Diagnosis Date  . Arthritis    back & L thumb  . Hypercholesteremia   . Hypertension   . Nonobstructive atherosclerosis of coronary artery 2008   Nonobstructive disease by cath  . Thymoma 11/24/2005   s/p resection of thymic mass    Past Surgical History:  Procedure Laterality Date  . CARDIAC CATHETERIZATION  2008   [facility]. - Dr. Geraldo Pitter -- non-obstructive CAD (by pt report - ~50% single vessel)  . CERVICAL LAMINECTOMY  1986  . EYE SURGERY     cataracts removed- bilateral   . NM MYOVIEW LTD  06/23/2016   EF 30-35% with severe hypokinesis of the distal septal, distal inferior, mid/distal anterior, mid/distal anterolateral walls and Akinesis of the apex. The cavity size was normal. Wall thickness was normal. Trivial Aortic regurgitation. Mitral Mild MR  . partial mediastinoscopy with thymectomy and resection of thymic  mass  11/24/2005   [place]  . TRANSTHORACIC ECHOCARDIOGRAM  06/19/2016   Normal LV size and function. EF 55-60%. GR 1 DD. No regional wall motion abnormalities. Mild (grade 1) DD. Mild LVH and trace AI,MR,TR  . Treadmill Stress Echo  2016   [place]: report not avaialble.  Pt reports - Normal result  . TUBAL LIGATION      MEDICATIONS: . acetaminophen (TYLENOL) 650 MG CR tablet  . aspirin EC 81 MG tablet  . cholecalciferol (VITAMIN D) 1000 units tablet  . fish oil-omega-3 fatty acids 1000 MG capsule  . hydrochlorothiazide (HYDRODIURIL) 12.5 MG tablet  . losartan (COZAAR) 50  MG tablet  . naproxen sodium (ALEVE) 220 MG tablet  . rosuvastatin (CRESTOR) 10 MG tablet  . vitamin B-12 (CYANOCOBALAMIN) 500 MCG tablet  . vitamin C (ASCORBIC ACID) 500 MG tablet   No current facility-administered medications for this encounter.     Wynonia Musty [facility] Short Stay Center/Anesthesiology Phone [phone] 12/20/2017 10:05 AM

## 2017-12-24 ENCOUNTER — Inpatient Hospital Stay (HOSPITAL_COMMUNITY): Payer: Medicare HMO | Admitting: Vascular Surgery

## 2017-12-24 ENCOUNTER — Inpatient Hospital Stay (HOSPITAL_COMMUNITY)
Admission: RE | Disposition: A | Discharge status: Home / Self Care | Payer: Self-pay | Source: Ambulatory Visit | Attending: Neurosurgery

## 2017-12-24 ENCOUNTER — Encounter (HOSPITAL_COMMUNITY): Payer: Self-pay

## 2017-12-24 ENCOUNTER — Other Ambulatory Visit: Payer: Self-pay

## 2017-12-24 ENCOUNTER — Inpatient Hospital Stay (HOSPITAL_COMMUNITY): Payer: Medicare HMO

## 2017-12-24 ENCOUNTER — Inpatient Hospital Stay (HOSPITAL_COMMUNITY): Payer: Medicare HMO | Admitting: Certified Registered"

## 2017-12-24 ENCOUNTER — Inpatient Hospital Stay (HOSPITAL_COMMUNITY)
Admission: RE | Admit: 2017-12-24 | Discharge: 2017-12-27 | DRG: 454 | Disposition: A | Discharge status: Home / Self Care | Payer: Medicare HMO | Source: Ambulatory Visit | Attending: Neurosurgery | Admitting: Neurosurgery

## 2017-12-24 DIAGNOSIS — Z885 Allergy status to narcotic agent status: Secondary | ICD-10-CM | POA: Diagnosis not present

## 2017-12-24 DIAGNOSIS — M48062 Spinal stenosis, lumbar region with neurogenic claudication: Principal | ICD-10-CM | POA: Diagnosis present

## 2017-12-24 DIAGNOSIS — M5116 Intervertebral disc disorders with radiculopathy, lumbar region: Secondary | ICD-10-CM | POA: Diagnosis not present

## 2017-12-24 DIAGNOSIS — Z79899 Other long term (current) drug therapy: Secondary | ICD-10-CM

## 2017-12-24 DIAGNOSIS — Z7982 Long term (current) use of aspirin: Secondary | ICD-10-CM | POA: Diagnosis not present

## 2017-12-24 DIAGNOSIS — M1990 Unspecified osteoarthritis, unspecified site: Secondary | ICD-10-CM | POA: Diagnosis not present

## 2017-12-24 DIAGNOSIS — M5416 Radiculopathy, lumbar region: Secondary | ICD-10-CM | POA: Diagnosis present

## 2017-12-24 DIAGNOSIS — I952 Hypotension due to drugs: Secondary | ICD-10-CM | POA: Diagnosis not present

## 2017-12-24 DIAGNOSIS — M4316 Spondylolisthesis, lumbar region: Secondary | ICD-10-CM | POA: Diagnosis present

## 2017-12-24 DIAGNOSIS — Z881 Allergy status to other antibiotic agents status: Secondary | ICD-10-CM | POA: Diagnosis not present

## 2017-12-24 DIAGNOSIS — I2510 Atherosclerotic heart disease of native coronary artery without angina pectoris: Secondary | ICD-10-CM | POA: Diagnosis not present

## 2017-12-24 DIAGNOSIS — E785 Hyperlipidemia, unspecified: Secondary | ICD-10-CM | POA: Diagnosis present

## 2017-12-24 DIAGNOSIS — I10 Essential (primary) hypertension: Secondary | ICD-10-CM | POA: Diagnosis not present

## 2017-12-24 DIAGNOSIS — D62 Acute posthemorrhagic anemia: Secondary | ICD-10-CM | POA: Diagnosis not present

## 2017-12-24 DIAGNOSIS — Z419 Encounter for procedure for purposes other than remedying health state, unspecified: Secondary | ICD-10-CM

## 2017-12-24 DIAGNOSIS — M4326 Fusion of spine, lumbar region: Secondary | ICD-10-CM | POA: Diagnosis not present

## 2017-12-24 DIAGNOSIS — I248 Other forms of acute ischemic heart disease: Secondary | ICD-10-CM | POA: Diagnosis not present

## 2017-12-24 DIAGNOSIS — I9581 Postprocedural hypotension: Secondary | ICD-10-CM | POA: Diagnosis not present

## 2017-12-24 DIAGNOSIS — M438X6 Other specified deforming dorsopathies, lumbar region: Secondary | ICD-10-CM | POA: Diagnosis not present

## 2017-12-24 HISTORY — DX: Unspecified osteoarthritis, unspecified site: M19.90

## 2017-12-24 SURGERY — POSTERIOR LUMBAR FUSION 2 LEVEL
Anesthesia: General | Site: Back

## 2017-12-24 MED ORDER — HYDROMORPHONE HCL 1 MG/ML IJ SOLN: 1.0000 mg | INTRAMUSCULAR | Status: DC | PRN

## 2017-12-24 MED ORDER — BISACODYL 10 MG RE SUPP: 10.0000 mg | Freq: Every day | RECTAL | Status: DC | PRN

## 2017-12-24 MED ORDER — HYDROXYZINE HCL 50 MG/ML IM SOLN
50.0000 mg | Freq: Four times a day (QID) | INTRAMUSCULAR | Status: DC | PRN
  Administered 2017-12-25: 50 mg via INTRAMUSCULAR
  Filled 2017-12-24 (×2): qty 1

## 2017-12-24 MED ORDER — VANCOMYCIN HCL 1000 MG IV SOLR
INTRAVENOUS | Status: AC
  Filled 2017-12-24: qty 1000

## 2017-12-24 MED ORDER — ONDANSETRON HCL 4 MG PO TABS: 4.0000 mg | ORAL_TABLET | Freq: Four times a day (QID) | ORAL | Status: DC | PRN

## 2017-12-24 MED ORDER — ACETAMINOPHEN 650 MG RE SUPP: 650.0000 mg | RECTAL | Status: DC | PRN

## 2017-12-24 MED ORDER — ALBUMIN HUMAN 5 % IV SOLN
INTRAVENOUS | Status: AC
  Filled 2017-12-24: qty 250

## 2017-12-24 MED ORDER — ALBUMIN HUMAN 5 % IV SOLN
12.5000 g | Freq: Once | INTRAVENOUS | Status: AC
  Administered 2017-12-24: 12.5 g via INTRAVENOUS

## 2017-12-24 MED ORDER — ACETAMINOPHEN 325 MG PO TABS
650.0000 mg | ORAL_TABLET | ORAL | Status: DC | PRN
  Administered 2017-12-26: 650 mg via ORAL
  Filled 2017-12-24 (×2): qty 2

## 2017-12-24 MED ORDER — EPHEDRINE SULFATE-NACL 50-0.9 MG/10ML-% IV SOSY
PREFILLED_SYRINGE | INTRAVENOUS | Status: DC | PRN
  Administered 2017-12-24 (×2): 5 mg via INTRAVENOUS

## 2017-12-24 MED ORDER — THROMBIN (RECOMBINANT) 20000 UNITS EX SOLR
CUTANEOUS | Status: AC
  Filled 2017-12-24: qty 20000

## 2017-12-24 MED ORDER — GLYCOPYRROLATE PF 0.2 MG/ML IJ SOSY
PREFILLED_SYRINGE | INTRAMUSCULAR | Status: AC
  Filled 2017-12-24: qty 1

## 2017-12-24 MED ORDER — VANCOMYCIN HCL 1 G IV SOLR
INTRAVENOUS | Status: DC | PRN
  Administered 2017-12-24: 1000 mg

## 2017-12-24 MED ORDER — PROPOFOL 10 MG/ML IV BOLUS
INTRAVENOUS | Status: AC
  Filled 2017-12-24: qty 20

## 2017-12-24 MED ORDER — FENTANYL CITRATE (PF) 250 MCG/5ML IJ SOLN
INTRAMUSCULAR | Status: AC
  Filled 2017-12-24: qty 5

## 2017-12-24 MED ORDER — FENTANYL CITRATE (PF) 100 MCG/2ML IJ SOLN: 25.0000 ug | INTRAMUSCULAR | Status: DC | PRN

## 2017-12-24 MED ORDER — LOSARTAN POTASSIUM 50 MG PO TABS
50.0000 mg | ORAL_TABLET | Freq: Every day | ORAL | Status: DC
Start: 2017-12-25 — End: 2017-12-26
  Filled 2017-12-24: qty 1

## 2017-12-24 MED ORDER — BUPIVACAINE HCL (PF) 0.25 % IJ SOLN
INTRAMUSCULAR | Status: AC
  Filled 2017-12-24: qty 30

## 2017-12-24 MED ORDER — LIDOCAINE 2% (20 MG/ML) 5 ML SYRINGE
INTRAMUSCULAR | Status: DC | PRN
  Administered 2017-12-24: 100 mg via INTRAVENOUS

## 2017-12-24 MED ORDER — POLYETHYLENE GLYCOL 3350 17 G PO PACK: 17.0000 g | PACK | Freq: Every day | ORAL | Status: DC | PRN

## 2017-12-24 MED ORDER — BUPIVACAINE HCL (PF) 0.25 % IJ SOLN
INTRAMUSCULAR | Status: DC | PRN
  Administered 2017-12-24: 20 mL

## 2017-12-24 MED ORDER — ALBUMIN HUMAN 5 % IV SOLN
INTRAVENOUS | Status: DC | PRN
  Administered 2017-12-24: 09:00:00 via INTRAVENOUS

## 2017-12-24 MED ORDER — CHLORHEXIDINE GLUCONATE CLOTH 2 % EX PADS: 6.0000 | MEDICATED_PAD | Freq: Once | CUTANEOUS | Status: DC

## 2017-12-24 MED ORDER — LACTATED RINGERS IV SOLN
INTRAVENOUS | Status: DC | PRN
  Administered 2017-12-24 (×2): via INTRAVENOUS

## 2017-12-24 MED ORDER — SODIUM CHLORIDE 0.9 % IV SOLN: 250.0000 mL | INTRAVENOUS | Status: DC

## 2017-12-24 MED ORDER — HYDROCHLOROTHIAZIDE 25 MG PO TABS
12.5000 mg | ORAL_TABLET | Freq: Every day | ORAL | Status: DC
  Filled 2017-12-24: qty 1

## 2017-12-24 MED ORDER — VITAMIN D 25 MCG (1000 UNIT) PO TABS
1000.0000 [IU] | ORAL_TABLET | Freq: Every day | ORAL | Status: DC
  Administered 2017-12-25 – 2017-12-27 (×3): 1000 [IU] via ORAL
  Filled 2017-12-24: qty 1

## 2017-12-24 MED ORDER — FENTANYL CITRATE (PF) 100 MCG/2ML IJ SOLN
25.0000 ug | INTRAMUSCULAR | Status: DC | PRN
  Administered 2017-12-24 (×3): 25 ug via INTRAVENOUS

## 2017-12-24 MED ORDER — FLEET ENEMA 7-19 GM/118ML RE ENEM: 1.0000 | ENEMA | Freq: Once | RECTAL | Status: DC | PRN

## 2017-12-24 MED ORDER — LIDOCAINE 2% (20 MG/ML) 5 ML SYRINGE
INTRAMUSCULAR | Status: AC
  Filled 2017-12-24: qty 5

## 2017-12-24 MED ORDER — LACTATED RINGERS IV SOLN
INTRAVENOUS | Status: DC
  Administered 2017-12-24: 07:00:00 via INTRAVENOUS

## 2017-12-24 MED ORDER — DEXAMETHASONE SODIUM PHOSPHATE 10 MG/ML IJ SOLN
10.0000 mg | INTRAMUSCULAR | Status: DC
  Filled 2017-12-24: qty 1

## 2017-12-24 MED ORDER — SODIUM CHLORIDE 0.9% FLUSH
3.0000 mL | Freq: Two times a day (BID) | INTRAVENOUS | Status: DC
  Administered 2017-12-25 – 2017-12-27 (×3): 3 mL via INTRAVENOUS

## 2017-12-24 MED ORDER — SUGAMMADEX SODIUM 200 MG/2ML IV SOLN
INTRAVENOUS | Status: AC
  Filled 2017-12-24: qty 2

## 2017-12-24 MED ORDER — DEXAMETHASONE SODIUM PHOSPHATE 10 MG/ML IJ SOLN
INTRAMUSCULAR | Status: DC | PRN
  Administered 2017-12-24: 10 mg via INTRAVENOUS

## 2017-12-24 MED ORDER — ONDANSETRON HCL 4 MG/2ML IJ SOLN
INTRAMUSCULAR | Status: DC | PRN
  Administered 2017-12-24: 4 mg via INTRAVENOUS

## 2017-12-24 MED ORDER — ROCURONIUM BROMIDE 50 MG/5ML IV SOSY
PREFILLED_SYRINGE | INTRAVENOUS | Status: AC
  Filled 2017-12-24: qty 5

## 2017-12-24 MED ORDER — OXYCODONE HCL 5 MG PO TABS
10.0000 mg | ORAL_TABLET | ORAL | Status: DC | PRN
  Filled 2017-12-24 (×2): qty 2

## 2017-12-24 MED ORDER — SODIUM CHLORIDE 0.9 % IV SOLN
INTRAVENOUS | Status: DC | PRN
  Administered 2017-12-24: 11:00:00 via INTRAVENOUS

## 2017-12-24 MED ORDER — GLYCOPYRROLATE PF 0.2 MG/ML IJ SOSY
PREFILLED_SYRINGE | INTRAMUSCULAR | Status: DC | PRN
  Administered 2017-12-24: .1 mg via INTRAVENOUS

## 2017-12-24 MED ORDER — THROMBIN 20000 UNITS EX SOLR
CUTANEOUS | Status: DC | PRN
  Administered 2017-12-24 (×2): 20 mL via TOPICAL

## 2017-12-24 MED ORDER — ROSUVASTATIN CALCIUM 10 MG PO TABS
10.0000 mg | ORAL_TABLET | Freq: Every day | ORAL | Status: DC
  Administered 2017-12-25 – 2017-12-27 (×2): 10 mg via ORAL
  Filled 2017-12-24: qty 1
  Filled 2017-12-24 (×2): qty 2

## 2017-12-24 MED ORDER — VANCOMYCIN HCL IN DEXTROSE 1-5 GM/200ML-% IV SOLN
1000.0000 mg | INTRAVENOUS | Status: AC
  Administered 2017-12-24: 1000 mg via INTRAVENOUS
  Filled 2017-12-24: qty 200

## 2017-12-24 MED ORDER — VITAMIN C 500 MG PO TABS
500.0000 mg | ORAL_TABLET | Freq: Every day | ORAL | Status: DC
  Administered 2017-12-25 – 2017-12-27 (×3): 500 mg via ORAL
  Filled 2017-12-24 (×3): qty 1

## 2017-12-24 MED ORDER — PHENYLEPHRINE 40 MCG/ML (10ML) SYRINGE FOR IV PUSH (FOR BLOOD PRESSURE SUPPORT)
PREFILLED_SYRINGE | INTRAVENOUS | Status: AC
  Filled 2017-12-24: qty 10

## 2017-12-24 MED ORDER — DEXAMETHASONE SODIUM PHOSPHATE 4 MG/ML IJ SOLN: INTRAMUSCULAR | Status: DC | PRN

## 2017-12-24 MED ORDER — OMEGA-3-ACID ETHYL ESTERS 1 G PO CAPS
1.0000 g | ORAL_CAPSULE | Freq: Every day | ORAL | Status: DC
  Administered 2017-12-25 – 2017-12-27 (×3): 1 g via ORAL
  Filled 2017-12-24 (×3): qty 1

## 2017-12-24 MED ORDER — ONDANSETRON HCL 4 MG/2ML IJ SOLN
INTRAMUSCULAR | Status: AC
  Filled 2017-12-24: qty 2

## 2017-12-24 MED ORDER — DIAZEPAM 5 MG PO TABS
5.0000 mg | ORAL_TABLET | Freq: Four times a day (QID) | ORAL | Status: DC | PRN
  Filled 2017-12-24: qty 2

## 2017-12-24 MED ORDER — HYDROCODONE-ACETAMINOPHEN 10-325 MG PO TABS
1.0000 | ORAL_TABLET | ORAL | Status: DC | PRN
  Administered 2017-12-24 – 2017-12-27 (×5): 1 via ORAL
  Filled 2017-12-24 (×5): qty 1

## 2017-12-24 MED ORDER — FENTANYL CITRATE (PF) 100 MCG/2ML IJ SOLN
INTRAMUSCULAR | Status: AC
  Filled 2017-12-24: qty 2

## 2017-12-24 MED ORDER — PHENYLEPHRINE 40 MCG/ML (10ML) SYRINGE FOR IV PUSH (FOR BLOOD PRESSURE SUPPORT)
PREFILLED_SYRINGE | INTRAVENOUS | Status: DC | PRN
  Administered 2017-12-24 (×2): 80 ug via INTRAVENOUS

## 2017-12-24 MED ORDER — PROPOFOL 10 MG/ML IV BOLUS
INTRAVENOUS | Status: DC | PRN
  Administered 2017-12-24: 120 mg via INTRAVENOUS

## 2017-12-24 MED ORDER — EPHEDRINE 5 MG/ML INJ
INTRAVENOUS | Status: AC
  Filled 2017-12-24: qty 10

## 2017-12-24 MED ORDER — FENTANYL CITRATE (PF) 100 MCG/2ML IJ SOLN
INTRAMUSCULAR | Status: DC | PRN
  Administered 2017-12-24 (×3): 50 ug via INTRAVENOUS
  Administered 2017-12-24: 100 ug via INTRAVENOUS
  Administered 2017-12-24: 50 ug via INTRAVENOUS

## 2017-12-24 MED ORDER — PHENOL 1.4 % MT LIQD: 1.0000 | OROMUCOSAL | Status: DC | PRN

## 2017-12-24 MED ORDER — ONDANSETRON HCL 4 MG/2ML IJ SOLN
4.0000 mg | Freq: Four times a day (QID) | INTRAMUSCULAR | Status: DC | PRN
  Administered 2017-12-24: 4 mg via INTRAVENOUS
  Filled 2017-12-24 (×2): qty 2

## 2017-12-24 MED ORDER — DEXAMETHASONE SODIUM PHOSPHATE 10 MG/ML IJ SOLN
INTRAMUSCULAR | Status: AC
  Filled 2017-12-24: qty 1

## 2017-12-24 MED ORDER — 0.9 % SODIUM CHLORIDE (POUR BTL) OPTIME
TOPICAL | Status: DC | PRN
  Administered 2017-12-24: 1000 mL

## 2017-12-24 MED ORDER — VITAMIN B-12 100 MCG PO TABS
500.0000 ug | ORAL_TABLET | Freq: Every day | ORAL | Status: DC
  Administered 2017-12-25 – 2017-12-27 (×3): 500 ug via ORAL
  Filled 2017-12-24 (×2): qty 1
  Filled 2017-12-24: qty 5

## 2017-12-24 MED ORDER — VANCOMYCIN HCL IN DEXTROSE 1-5 GM/200ML-% IV SOLN
1000.0000 mg | Freq: Once | INTRAVENOUS | Status: AC
  Administered 2017-12-24: 1000 mg via INTRAVENOUS
  Filled 2017-12-24: qty 200

## 2017-12-24 MED ORDER — SODIUM CHLORIDE 0.9 % IV SOLN
INTRAVENOUS | Status: DC | PRN
  Administered 2017-12-24: 500 mL

## 2017-12-24 MED ORDER — THROMBIN 5000 UNITS EX SOLR
CUTANEOUS | Status: AC
  Filled 2017-12-24: qty 5000

## 2017-12-24 MED ORDER — SODIUM CHLORIDE 0.9% FLUSH: 3.0000 mL | INTRAVENOUS | Status: DC | PRN

## 2017-12-24 MED ORDER — ROCURONIUM BROMIDE 50 MG/5ML IV SOSY
PREFILLED_SYRINGE | INTRAVENOUS | Status: DC | PRN
  Administered 2017-12-24: 20 mg via INTRAVENOUS
  Administered 2017-12-24: 50 mg via INTRAVENOUS
  Administered 2017-12-24: 20 mg via INTRAVENOUS
  Administered 2017-12-24: 10 mg via INTRAVENOUS

## 2017-12-24 MED ORDER — SODIUM CHLORIDE 0.9 % IV SOLN
INTRAVENOUS | Status: DC | PRN
  Administered 2017-12-24: 40 ug/min via INTRAVENOUS
  Administered 2017-12-24: 10:00:00 via INTRAVENOUS

## 2017-12-24 MED ORDER — MENTHOL 3 MG MT LOZG: 1.0000 | LOZENGE | OROMUCOSAL | Status: DC | PRN

## 2017-12-24 MED ORDER — SUGAMMADEX SODIUM 200 MG/2ML IV SOLN
INTRAVENOUS | Status: DC | PRN
  Administered 2017-12-24: 200 mg via INTRAVENOUS

## 2017-12-24 SURGICAL SUPPLY — 65 items
BAG DECANTER FOR FLEXI CONT (MISCELLANEOUS) ×2 IMPLANT
BENZOIN TINCTURE PRP APPL 2/3 (GAUZE/BANDAGES/DRESSINGS) ×2 IMPLANT
BLADE CLIPPER SURG (BLADE) IMPLANT
BUR CUTTER 7.0 ROUND (BURR) IMPLANT
BUR MATCHSTICK NEURO 3.0 LAGG (BURR) ×2 IMPLANT
CANISTER SUCT 3000ML PPV (MISCELLANEOUS) ×2 IMPLANT
CAP LCK SPNE (Orthopedic Implant) ×6 IMPLANT
CAP LOCK SPINE RADIUS (Orthopedic Implant) ×6 IMPLANT
CAP LOCKING (Orthopedic Implant) ×6 IMPLANT
CARTRIDGE OIL MAESTRO DRILL (MISCELLANEOUS) ×1 IMPLANT
CLSR STERI-STRIP ANTIMIC 1/2X4 (GAUZE/BANDAGES/DRESSINGS) ×2 IMPLANT
CONT SPEC 4OZ CLIKSEAL STRL BL (MISCELLANEOUS) ×2 IMPLANT
COVER BACK TABLE 60X90IN (DRAPES) ×2 IMPLANT
COVER WAND RF STERILE (DRAPES) ×2 IMPLANT
DECANTER SPIKE VIAL GLASS SM (MISCELLANEOUS) ×2 IMPLANT
DERMABOND ADVANCED (GAUZE/BANDAGES/DRESSINGS) ×1
DERMABOND ADVANCED .7 DNX12 (GAUZE/BANDAGES/DRESSINGS) ×1 IMPLANT
DEVICE INTERBODY ELEVATE 23X9 (Cage) ×8 IMPLANT
DIFFUSER DRILL AIR PNEUMATIC (MISCELLANEOUS) ×2 IMPLANT
DRAPE C-ARM 42X72 X-RAY (DRAPES) ×4 IMPLANT
DRAPE HALF SHEET 40X57 (DRAPES) ×2 IMPLANT
DRAPE LAPAROTOMY 100X72X124 (DRAPES) ×2 IMPLANT
DRAPE SURG 17X23 STRL (DRAPES) ×8 IMPLANT
DRSG OPSITE POSTOP 4X6 (GAUZE/BANDAGES/DRESSINGS) IMPLANT
DRSG OPSITE POSTOP 4X8 (GAUZE/BANDAGES/DRESSINGS) ×2 IMPLANT
DURAPREP 26ML APPLICATOR (WOUND CARE) ×2 IMPLANT
ELECT REM PT RETURN 9FT ADLT (ELECTROSURGICAL) ×2
ELECTRODE REM PT RTRN 9FT ADLT (ELECTROSURGICAL) ×1 IMPLANT
EVACUATOR 1/8 PVC DRAIN (DRAIN) IMPLANT
GAUZE 4X4 16PLY RFD (DISPOSABLE) IMPLANT
GAUZE SPONGE 4X4 12PLY STRL (GAUZE/BANDAGES/DRESSINGS) IMPLANT
GLOVE BIOGEL PI IND STRL 7.0 (GLOVE) ×2 IMPLANT
GLOVE BIOGEL PI IND STRL 7.5 (GLOVE) ×2 IMPLANT
GLOVE BIOGEL PI INDICATOR 7.0 (GLOVE) ×2
GLOVE BIOGEL PI INDICATOR 7.5 (GLOVE) ×2
GLOVE ECLIPSE 9.0 STRL (GLOVE) ×4 IMPLANT
GLOVE EXAM NITRILE XL STR (GLOVE) IMPLANT
GLOVE SURG SS PI 7.0 STRL IVOR (GLOVE) ×10 IMPLANT
GLOVE SURG SS PI 7.5 STRL IVOR (GLOVE) IMPLANT
GOWN STRL REUS W/ TWL LRG LVL3 (GOWN DISPOSABLE) ×3 IMPLANT
GOWN STRL REUS W/ TWL XL LVL3 (GOWN DISPOSABLE) ×3 IMPLANT
GOWN STRL REUS W/TWL 2XL LVL3 (GOWN DISPOSABLE) IMPLANT
GOWN STRL REUS W/TWL LRG LVL3 (GOWN DISPOSABLE) ×3
GOWN STRL REUS W/TWL XL LVL3 (GOWN DISPOSABLE) ×3
KIT BASIN OR (CUSTOM PROCEDURE TRAY) ×2 IMPLANT
KIT TURNOVER KIT B (KITS) ×2 IMPLANT
MILL MEDIUM DISP (BLADE) ×2 IMPLANT
NEEDLE HYPO 22GX1.5 SAFETY (NEEDLE) ×2 IMPLANT
NS IRRIG 1000ML POUR BTL (IV SOLUTION) ×2 IMPLANT
OIL CARTRIDGE MAESTRO DRILL (MISCELLANEOUS) ×2
PACK LAMINECTOMY NEURO (CUSTOM PROCEDURE TRAY) ×2 IMPLANT
ROD 70MM (Rod) ×2 IMPLANT
ROD SPNL 70X5.5XNS TI RDS (Rod) ×2 IMPLANT
SCREW 5.75X45MM (Screw) ×12 IMPLANT
SPONGE LAP 4X18 RFD (DISPOSABLE) ×2 IMPLANT
SPONGE SURGIFOAM ABS GEL 100 (HEMOSTASIS) ×4 IMPLANT
STRIP CLOSURE SKIN 1/2X4 (GAUZE/BANDAGES/DRESSINGS) ×4 IMPLANT
SUT VIC AB 0 CT1 18XCR BRD8 (SUTURE) ×2 IMPLANT
SUT VIC AB 0 CT1 8-18 (SUTURE) ×2
SUT VIC AB 2-0 CT1 18 (SUTURE) ×4 IMPLANT
SUT VIC AB 3-0 SH 8-18 (SUTURE) ×4 IMPLANT
TOWEL GREEN STERILE (TOWEL DISPOSABLE) ×2 IMPLANT
TOWEL GREEN STERILE FF (TOWEL DISPOSABLE) ×2 IMPLANT
TRAY FOLEY MTR SLVR 16FR STAT (SET/KITS/TRAYS/PACK) ×2 IMPLANT
WATER STERILE IRR 1000ML POUR (IV SOLUTION) ×2 IMPLANT

## 2017-12-24 NOTE — Op Note (Addendum)
Date of procedure: 12/24/2017  Date of dictation: Same  Service: Neurosurgery  Preoperative diagnosis: Right L3-4 herniated nucleus pulposis with radiculopathy, grade 1 L4-L5 degenerative spondylolisthesis with severe stenosis and neurogenic claudication  Postoperative diagnosis: Same  Procedure Name: Bilateral L3-4 and bilateral L4-5 decompressive laminotomies and foraminotomies, more than would be required for simple interbody fusion alone.  Bilateral L4-L5 ponte osteotomies for sagittal balance correction   L3-4, L4-5 posterior lumbar interbody fusion utilizing interbody cages and locally harvested autograft  L3-4-5 posterior lateral arthrodesis utilizing segmental pedicle screw fixation and local autograft  Surgeon:Gizzelle Lacomb A.Pandora Mccrackin, M.D.  Asst. Surgeon: Reinaldo Meeker, NP  Anesthesia: General  Indication: 83 year old female with back and severe right lower extremity pain paresthesias and weakness with a right-sided L4 radiculopathy as well as long-standing severe neurogenic claudication.  Work-up demonstrates evidence of grade 1 L4-5 degenerative spondylolisthesis with critical stenosis.  At L3-4 the patient has evidence of a large paracentral disc herniation with marked thecal sac and nerve root compression.  Patient presents now for 2 level lumbar decompression and fusion in hopes of improving her symptoms.  Operative note: After induction anesthesia, patient position prone on the Wilson frame and properly padded.  Lumbar region prepped and draped sterilely.  Incision made overlying L3-4-5.  Dissection performed bilaterally.  Retractor placed.  Fluoroscopy used.  Levels confirmed.  Decompressive laminotomies and facetectomies were then performed bilaterally using [facility] a high-speed drill to remove the inferior two thirds of the lamina of L3 and L4, higher pars and inferior facet of L3 and L4 bilaterally the majority the superior facets of L4 and L5 bilaterally.  Ligament  flavum was elevated and resection.  Decompression along the facet joints at L4-5 completed a ponte osteotomy and allowed for better sagittal plane correction.  Bilateral discectomies then performed including removal of a large right-sided disc herniation at L3-4.  The space was then prepared for interbody fusion.  Starting first at L4-5 with a distractor placed the patient's right side disc space was prepared for interbody fusion.  Soft tissue was removed the interspace.  A 9 mm [employer] expandable cage packed with locally harvested autograft was impacted into place and expanded to its full extent.  Distractor removed patient's right side.  The space prepared on the right side.  Morselized autograft packed in the interspace.  A second cage packed with autograft was then impacted into place and expanded to its full extent.  Cages were confirmed to be in good position by fluoroscopy.  Procedure was then repeated at L3-4 again without complication.  Pedicles at L4-L5 and L3 were identified using surface landmarks and intraoperative fluoroscopy with superficial bone overlying the pedicle was then removed using high-speed drill.  Pedicle was then probed using a pedicle all each pedicle all track was then probed and found to be solidly within the bone.  Each all track was then tapped with a screw tapped and then 5.75 mm radius bran screws from [employer] were placed bilaterally.  Final images reveal good position of the cages and the hardware at the proper upper level with normal alignment spine.  Wound was irrigated one final time.  Transverse processes of L3-4 and 5 were decorticated.  Morselized autograft was packed posterior laterally.  Short segment titanium rod placed of the screws from L3-L5.  Locking caps placed of the screws were locking caps and engaged with a construct under mild compression.  Vancomycin powder was placed in deep wound space.  Wounds and closed in layers of  Vicryl sutures.  Steri-Strips  and sterile dressing were applied.  No apparent complications.  Patient tolerated the procedure well and she returns to the recovery room postop

## 2017-12-24 NOTE — Brief Op Note (Signed)
12/24/2017  11:40 AM  PATIENT:  Belinda Brown  83 y.o. female  PRE-OPERATIVE DIAGNOSIS:  Spondylolisthesis  POST-OPERATIVE DIAGNOSIS:  Spondylolisthesis  PROCEDURE:  Procedure(s): POSTERIOR LUMBAR INTERBODY FUSION - LUMBAR THREE-LUMBAR FOUR - LUMBAR FOUR-LUMBAR FIVE - Posterior Lateral and Interbody fusion (N/A)  SURGEON:  Surgeon(s) and Role:    * Earnie Larsson, MD - Primary  PHYSICIAN ASSISTANT:   ASSISTANTSMearl Latin   ANESTHESIA:   general  EBL:  400 mL   BLOOD ADMINISTERED:none  DRAINS: none   LOCAL MEDICATIONS USED:  MARCAINE     SPECIMEN:  No Specimen  DISPOSITION OF SPECIMEN:  N/A  COUNTS:  YES  TOURNIQUET:  * No tourniquets in log *  DICTATION: .Dragon Dictation  PLAN OF CARE: Admit to inpatient   PATIENT DISPOSITION:  PACU - hemodynamically stable.   Delay start of Pharmacological VTE agent (>24hrs) due to surgical blood loss or risk of bleeding: yes

## 2017-12-24 NOTE — Transfer of Care (Signed)
Immediate Anesthesia Transfer of Care Note  Patient: Belinda Brown  Procedure(s) Performed: POSTERIOR LUMBAR INTERBODY FUSION - LUMBAR THREE-LUMBAR FOUR - LUMBAR FOUR-LUMBAR FIVE - Posterior Lateral and Interbody fusion (N/A Back)  Patient Location: PACU  Anesthesia Type:General  Level of Consciousness: drowsy and patient cooperative  Airway & Oxygen Therapy: Patient Spontanous Breathing and Patient connected to face mask oxygen  Post-op Assessment: Report given to RN and Post -op Vital signs reviewed and stable  Post vital signs: Reviewed and stable  Last Vitals:  Vitals Value Taken Time  BP 134/71 12/24/2017 11:59 AM  Temp    Pulse 88 12/24/2017 12:00 PM  Resp 14 12/24/2017 12:00 PM  SpO2 100 % 12/24/2017 12:00 PM  Vitals shown include unvalidated device data.  Last Pain:  Vitals:   12/24/17 0650  TempSrc: Oral  PainSc:       Patients Stated Pain Goal: 3 (08/24/17 7588)  Complications: No apparent anesthesia complications

## 2017-12-24 NOTE — Progress Notes (Signed)
Pharmacy Antibiotic Note  Belinda Brown is a 83 y.o. female s/p lumbar surgery. Pharmacy consulted for Vancomycin x 1 dose post-op for surgical prophylaxis.  No drain. Vancomycin 1gm IV given pre-op at 0649.  Ampicillin allergy.   Plan:  Vancomycin 1gm IV x 1 dose ~8pm tonight.  No follow up needed.  Pharmacy signing off.  Height: 5\' 5"  (165.1 cm) Weight: 141 lb 8 oz (64.2 kg) IBW/kg (Calculated) : 57  Temp (24hrs), Avg:97.4 F (36.3 C), Min:97 F (36.1 C), Max:97.8 F (36.6 C)  Recent Labs  Lab 12/19/17 1129  WBC 5.4  CREATININE 0.76    Estimated Creatinine Clearance: 49.6 mL/min (by C-G formula based on SCr of 0.76 mg/dL).    Allergies  Allergen Reactions  . Ampicillin Hives    Has patient had a PCN reaction causing immediate rash, facial/tongue/throat swelling, SOB or lightheadedness with hypotension: Yes Has patient had a PCN reaction causing severe rash involving mucus membranes or skin necrosis: No Has patient had a PCN reaction that required hospitalization: No Has patient had a PCN reaction occurring within the last 10 years: No If all of the above answers are "NO", then may proceed with Cephalosporin use.   . Codeine Other (See Comments)    hallucinations    Antimicrobials this admission:  Vancomycin 11/11 - pre and post-op x 1 dose  Microbiology results:  11/6 MRSA PCR negative  Thank you for allowing pharmacy to be a part of this patient's care.  Arty Baumgartner, [place] Pager: [phone] 12/24/2017 6:03 PM

## 2017-12-24 NOTE — Anesthesia Postprocedure Evaluation (Signed)
Anesthesia Post Note  Patient: Belinda Brown  Procedure(s) Performed: POSTERIOR LUMBAR INTERBODY FUSION - LUMBAR THREE-LUMBAR FOUR - LUMBAR FOUR-LUMBAR FIVE - Posterior Lateral and Interbody fusion (N/A Back)     Patient location during evaluation: PACU Anesthesia Type: General Level of consciousness: awake Pain management: pain level controlled Vital Signs Assessment: post-procedure vital signs reviewed and stable Respiratory status: spontaneous breathing Cardiovascular status: stable Postop Assessment: no apparent nausea or vomiting Anesthetic complications: no    Last Vitals:  Vitals:   12/24/17 1534 12/24/17 1539  BP: (!) 103/59 106/68  Pulse: 88 94  Resp: 15 (!) 21  Temp:    SpO2: 98% 98%    Last Pain:  Vitals:   12/24/17 1409  TempSrc:   PainSc: 4                  Sonyia Muro

## 2017-12-24 NOTE — H&P (Signed)
Belinda Brown is an 83 y.o. female.   Chief Complaint: Back and right leg pain HPI: 83 year old female with a 46-month history of progressive back pain and a 52-month history of severe right lower extremity pain.  Patient's pain involves her right anterior thigh and anterior medial leg.  It is associated with weakness.  Patient has suffered falls.  Prior to the onset of this radicular pain the patient had very significant neurogenic claudication which was very limiting.  Work-up demonstrates evidence of 2 significant findings.  First at L4-5 patient is evidence of a grade 1 L4-5 degenerative spondylolisthesis with severe stenosis.  At L3-4 on the right side the patient has a large right-sided disc herniation with marked compression of thecal sac and right L4 nerve roots.  Patient presents now for 2 level lumbar decompression and fusion in hopes of improving her symptoms.  Past Medical History:  Diagnosis Date  . Arthritis    back & L thumb  . Hypercholesteremia   . Hypertension   . Nonobstructive atherosclerosis of coronary artery 2008   Nonobstructive disease by cath  . Thymoma 11/24/2005   s/p resection of thymic mass    Past Surgical History:  Procedure Laterality Date  . CARDIAC CATHETERIZATION  2008   [facility]. - Dr. Geraldo Pitter -- non-obstructive CAD (by pt report - ~50% single vessel)  . CERVICAL LAMINECTOMY  1986  . EYE SURGERY     cataracts removed- bilateral   . NM MYOVIEW LTD  06/23/2016   EF 30-35% with severe hypokinesis of the distal septal, distal inferior, mid/distal anterior, mid/distal anterolateral walls and Akinesis of the apex. The cavity size was normal. Wall thickness was normal. Trivial Aortic regurgitation. Mitral Mild MR  . partial mediastinoscopy with thymectomy and resection of thymic mass  11/24/2005   [place]  . TRANSTHORACIC ECHOCARDIOGRAM  06/19/2016   Normal LV size and function. EF 55-60%. GR 1 DD. No regional wall motion abnormalities. Mild (grade  1) DD. Mild LVH and trace AI,MR,TR  . Treadmill Stress Echo  2016   [place]: report not avaialble.  Pt reports - Normal result  . TUBAL LIGATION      Family History  Problem Relation Age of Onset  . Congestive Heart Failure Mother   . Heart attack Father   . Cancer Sister   . COPD Brother   . Heart failure Maternal Grandmother   . Pneumonia Maternal Grandfather   . Heart failure Paternal Grandmother   . Heart failure Paternal Grandfather    Social History:  reports that she has never smoked. She has never used smokeless tobacco. She reports that she drinks alcohol. She reports that she does not use drugs.  Allergies:  Allergies  Allergen Reactions  . Ampicillin Hives    Has patient had a PCN reaction causing immediate rash, facial/tongue/throat swelling, SOB or lightheadedness with hypotension: Yes Has patient had a PCN reaction causing severe rash involving mucus membranes or skin necrosis: No Has patient had a PCN reaction that required hospitalization: No Has patient had a PCN reaction occurring within the last 10 years: No If all of the above answers are "NO", then may proceed with Cephalosporin use.   . Codeine Other (See Comments)    hallucinations    Medications Prior to Admission  Medication Sig Dispense Refill  . acetaminophen (TYLENOL) 650 MG CR tablet Take 650-1,300 mg by mouth every 8 (eight) hours as needed for pain.    Marland Kitchen aspirin EC 81 MG tablet  Take 81 mg by mouth daily.    . cholecalciferol (VITAMIN D) 1000 units tablet Take 1,000 Units by mouth daily.    . fish oil-omega-3 fatty acids 1000 MG capsule Take 1 g by mouth daily.     . hydrochlorothiazide (HYDRODIURIL) 12.5 MG tablet Take 12.5 mg by mouth daily.  0  . losartan (COZAAR) 50 MG tablet Take 50 mg by mouth daily.  0  . naproxen sodium (ALEVE) 220 MG tablet Take 440 mg by mouth 2 (two) times daily as needed.    . vitamin B-12 (CYANOCOBALAMIN) 500 MCG tablet Take 500 mcg by mouth daily.    . vitamin  C (ASCORBIC ACID) 500 MG tablet Take 500 mg by mouth daily.      . rosuvastatin (CRESTOR) 10 MG tablet Take 10 mg by mouth daily.      No results found for this or any previous visit (from the past 48 hour(s)). No results found.  Pertinent items noted in HPI and remainder of comprehensive ROS otherwise negative.  Blood pressure (!) 150/94, pulse 70, temperature 97.8 F (36.6 C), temperature source Oral, resp. rate 20, height 5\' 5"  (1.651 m), weight 64.2 kg, SpO2 99 %.  Patient is awake and alert.  She is oriented and appropriate.  Cranial nerve function is intact.  Motor examination extremities reveals significant weakness of her right quadriceps muscle group grading at 4-/5.  She has mild weakness of her right anterior tibialis.  Remainder of her motor strength intact.  Sensory examination with decreased sensation pinprick light touch in her right L4 dermatome.  Deep tender versus normal active except her right patellar reflexes diminished in her Achilles reflexes are absent bilaterally.  Gait is somewhat antalgic and posture is flexed.  Examination head ears eyes no throats unremarkable her chest and abdomen are benign.  Extremities are free from injury deformity. Assessment/Plan Right L3-4 herniated nucleus pulposis with stenosis and radiculopathy, L4-5 degenerative spondylolisthesis with severe stenosis and neurogenic claudication.  Plan bilateral L3-4 and L4-5 decompressive laminotomies and foraminotomies followed by posterior lumbar interbody fusion utilizing interbody cages, locally harvested autograft, and augmented with posterior lateral arthrodesis utilizing segmental pedicle screw fixation.  Risks and benefits of been explained.  Patient wishes to proceed.  Belinda Brown A Belinda Brown 12/24/2017, 7:50 AM

## 2017-12-24 NOTE — Plan of Care (Signed)
  Problem: Pain Management: Goal: Pain level will decrease Outcome: Progressing   Problem: Coping: Goal: Level of anxiety will decrease Outcome: Progressing   Problem: Bladder/Genitourinary: Goal: Urinary functional status for postoperative course will improve Outcome: Progressing

## 2017-12-24 NOTE — Anesthesia Postprocedure Evaluation (Signed)
Anesthesia Post Note  Patient: Ronnell Guadalajara  Procedure(s) Performed: POSTERIOR LUMBAR INTERBODY FUSION - LUMBAR THREE-LUMBAR FOUR - LUMBAR FOUR-LUMBAR FIVE - Posterior Lateral and Interbody fusion (N/A Back)     Patient location during evaluation: PACU Anesthesia Type: General Level of consciousness: awake Pain management: pain level controlled Vital Signs Assessment: post-procedure vital signs reviewed and stable Respiratory status: spontaneous breathing Postop Assessment: no apparent nausea or vomiting Anesthetic complications: no    Last Vitals:  Vitals:   12/24/17 1534 12/24/17 1539  BP: (!) 103/59 106/68  Pulse: 88 94  Resp: 15 (!) 21  Temp:    SpO2: 98% 98%    Last Pain:  Vitals:   12/24/17 1409  TempSrc:   PainSc: 4                  Harrison Zetina

## 2017-12-24 NOTE — Progress Notes (Signed)
Dr. Annette Stable notified of low blood pressures with associated interventions and improving BP. No new orders given.

## 2017-12-24 NOTE — Anesthesia Procedure Notes (Addendum)
Procedure Name: Intubation Date/Time: 12/24/2017 8:11 AM Performed by: Orlie Dakin, CRNA Pre-anesthesia Checklist: Patient identified, Emergency Drugs available, Suction available and Patient being monitored Patient Re-evaluated:Patient Re-evaluated prior to induction Oxygen Delivery Method: Circle system utilized Preoxygenation: Pre-oxygenation with 100% oxygen Induction Type: IV induction Ventilation: Mask ventilation without difficulty Laryngoscope Size: Miller and 3 Grade View: Grade II Tube type: Oral Tube size: 7.0 mm Number of attempts: 1 Airway Equipment and Method: Stylet Placement Confirmation: ETT inserted through vocal cords under direct vision,  positive ETCO2 and breath sounds checked- equal and bilateral Secured at: 23 cm Tube secured with: Tape Dental Injury: Teeth and Oropharynx as per pre-operative assessment  Comments: In Short-stay noted left upper front tooth worn down, not even or smooth.  4x4s bite block used prior to extubation.

## 2017-12-25 MED ORDER — METHOCARBAMOL 500 MG PO TABS
500.0000 mg | ORAL_TABLET | Freq: Four times a day (QID) | ORAL | Status: DC | PRN
  Administered 2017-12-25 – 2017-12-27 (×5): 500 mg via ORAL
  Filled 2017-12-25 (×6): qty 1

## 2017-12-25 MED ORDER — HYDROCODONE-ACETAMINOPHEN 5-325 MG PO TABS
1.0000 | ORAL_TABLET | ORAL | Status: DC | PRN
  Administered 2017-12-25 (×2): 1 via ORAL
  Filled 2017-12-25 (×2): qty 1

## 2017-12-25 MED ORDER — OXYCODONE HCL 5 MG PO TABS
5.0000 mg | ORAL_TABLET | ORAL | Status: DC | PRN
  Administered 2017-12-26 (×2): 10 mg via ORAL

## 2017-12-25 MED FILL — Thrombin (Recombinant) For Soln 20000 Unit: CUTANEOUS | Qty: 1 | Status: AC

## 2017-12-25 MED FILL — Heparin Sodium (Porcine) Inj 1000 Unit/ML: INTRAMUSCULAR | Qty: 30 | Status: AC

## 2017-12-25 MED FILL — Sodium Chloride IV Soln 0.9%: INTRAVENOUS | Qty: 1000 | Status: AC

## 2017-12-25 NOTE — Progress Notes (Signed)
Postop day 1.  Patient with expected lumbar discomfort.  No lower extremity pain.  No complaints of weakness or sensory loss.  Patient borderline hypotensive however heart rate is normal and urine output is good.  She is not symptomatic in any way.  Patient currently awake and alert.  She is oriented and appropriate.  Motor and sensory function are intact.  Wound clean and dry.  Abdomen soft.  Progressing well following 2 level lumbar decompression and fusion surgery.  Continue efforts at mobilization.  Probable discharge home tomorrow.

## 2017-12-25 NOTE — Evaluation (Signed)
Physical Therapy Evaluation Patient Details Name: Belinda Brown MRN: [id number] DOB: 1934-03-03 Today's Date: 12/25/2017   History of Present Illness  Patient is an 83 year old female s/p lumbar decompression and fusion of L3-4 and L4-5 on 12/24/17 after an 8 month history of progressive back pain, 2 month history of R lower extremity pain and a history of falls. She has a history of non obstructive coronary artery atherosclerosis, decreased EF, mild valve regurgitation which may affect her tolerance to exercise. Additionally, previous cervical laminectomy.   Clinical Impression  Patient presents to physical therapy with guarded and slow gait, decreased right LE weightbearing post lumbar decompression and fusion. Pt was educated on precautions and provided handout. She demonstrates comprehension of back precautions and demonstrated adherence with verbal cues with transfers. Lower extremity weakness demonstrated during ascend/descending stairs may limit her ability to ambulate around community independently. Son provided assistance for ascend/descend stairs and was educated on safe stair navigation. Pt was educated on proper car transfers. Pt will benefit from skilled physical therapy to increase their independence and safety with mobility to allow discharge to the venue listed below.     Follow Up Recommendations No PT follow up;Supervision for mobility/OOB    Equipment Recommendations  None recommended by PT    Recommendations for Other Services       Precautions / Restrictions Precautions Precautions: Back Precaution Booklet Issued: Yes (comment)(reviewed handout) Precaution Comments: reviewed handout, verbal cues for maintenance of precautions during functional mobility Required Braces or Orthoses: Spinal Brace Spinal Brace: Lumbar corset;Applied in sitting position Restrictions Weight Bearing Restrictions: No      Mobility  Bed Mobility Overal bed mobility: Independent              General bed mobility comments: Patient presented sitting EOB  Transfers Overall transfer level: Needs assistance Equipment used: None Transfers: Sit to/from Stand Sit to Stand: Min guard;Min assist         General transfer comment: PT provided assistance to pt descending into chair and verbal cues required to maintain back precautions  Ambulation/Gait Ambulation/Gait assistance: Supervision Gait Distance (Feet): 200 Feet Assistive device: None Gait Pattern/deviations: Decreased weight shift to right;Trunk flexed Gait velocity: Decreased Gait velocity interpretation: <1.31 ft/sec, indicative of household ambulator General Gait Details: Guarded gait, supervision for safety   Stairs Stairs: Yes Stairs assistance: Min assist Stair Management: No rails Number of Stairs: 2 General stair comments: with son for support to practice at home, no railings on stairs at home.   Wheelchair Mobility    Modified Rankin (Stroke Patients Only)       Balance Overall balance assessment: History of Falls;Mild deficits observed, not formally tested                                           Pertinent Vitals/Pain Pain Assessment: Faces Faces Pain Scale: Hurts little more Pain Location: back    Home Living Family/patient expects to be discharged to:: Private residence Living Arrangements: Children;Alone Available Help at Discharge: Family Type of Home: House Home Access: Stairs to enter Entrance Stairs-Rails: None Entrance Stairs-Number of Steps: 2 stairs to enter her and son's home Home Layout: One level Home Equipment: Cane - single point;Walker - 2 wheels      Prior Function Level of Independence: Independent               Hand Dominance  Extremity/Trunk Assessment   Upper Extremity Assessment Upper Extremity Assessment: Defer to OT evaluation    Lower Extremity Assessment Lower Extremity Assessment: Generalized weakness;RLE  deficits/detail RLE Deficits / Details: Patient complains of "catch" in hip near groin area that was present before surgery       Communication   Communication: No difficulties  Cognition Arousal/Alertness: Awake/alert Behavior During Therapy: WFL for tasks assessed/performed Overall Cognitive Status: Within Functional Limits for tasks assessed                                        General Comments      Exercises     Assessment/Plan    PT Assessment Patient needs continued PT services  PT Problem List Decreased strength;Pain;Decreased activity tolerance;Decreased knowledge of precautions;Decreased mobility;Decreased balance;Decreased safety awareness       PT Treatment Interventions Functional mobility training;Stair training;Balance training;Patient/family education;Therapeutic exercise;Gait training;Therapeutic activities;Neuromuscular re-education;DME instruction    PT Goals (Current goals can be found in the Care Plan section)  Acute Rehab PT Goals Patient Stated Goal: No goals stated this session PT Goal Formulation: With patient/family Time For Goal Achievement: 01/08/18 Potential to Achieve Goals: Good    Frequency Min 5X/week   Barriers to discharge        Co-evaluation               AM-PAC PT "6 Clicks" Daily Activity  Outcome Measure Difficulty turning over in bed (including adjusting bedclothes, sheets and blankets)?: Unable Difficulty moving from lying on back to sitting on the side of the bed? : Unable Difficulty sitting down on and standing up from a chair with arms (e.g., wheelchair, bedside commode, etc,.)?: Unable Help needed moving to and from a bed to chair (including a wheelchair)?: A Little Help needed walking in hospital room?: A Little Help needed climbing 3-5 steps with a railing? : A Little 6 Click Score: 12    End of Session Equipment Utilized During Treatment: Gait belt;Back brace Activity Tolerance: Patient  tolerated treatment well Patient left: in chair;with family/visitor present Nurse Communication: Mobility status PT Visit Diagnosis: Repeated falls (R29.6);Muscle weakness (generalized) (M62.81);Other abnormalities of gait and mobility (R26.89)    Time: 9774-1423 PT Time Calculation (min) (ACUTE ONLY): 14 min   Charges:   PT Evaluation $PT Eval Moderate Complexity: 1 [place], SPT  Acute Rehab Services  Office: [place] 12/25/2017, 9:46 AM

## 2017-12-25 NOTE — Evaluation (Signed)
Occupational Therapy Evaluation Patient Details Name: Belinda Brown MRN: [id number] DOB: Jun 19, 1934 Today's Date: 12/25/2017    History of Present Illness Patient is an 83 year old female s/p lumbar decompression and fusion of L3-4 and L4-5 on 12/24/17 after an 8 month history of progressive back pain, 2 month history of R lower extremity pain and a history of falls. She has a history of non obstructive coronary artery atherosclerosis, decreased EF, mild valve regurgitation which may affect her tolerance to exercise. Additionally, previous cervical laminectomy.    Clinical Impression   PTA Pt independent in ADL and mobility. Pt is currently mod A for LB ADL, min guard for transfers and in room mobility with cues for sequencing and back precautions. Back handout provided and reviewed adls in detail. Pt educated on: clothing between brace, never sleep in brace, set an alarm at night for medication, avoid sitting for long periods of time, correct bed positioning for sleeping, correct sequence for bed mobility, energy conservation as Pt cannot currently stand for longer than 5 min at a time, avoiding lifting more than 5 pounds and never wash directly over incision. Also introduced AE for LB ADL. Son present throughout session. OT will follow acutely, do not anticipate OT follow up post-acute. Next session to demo AE and focus on energy conservation (provide handout?) as well as LB ADL.      Follow Up Recommendations  Supervision/Assistance - 24 hour    Equipment Recommendations  None recommended by OT(Pt has appropriate DME )    Recommendations for Other Services       Precautions / Restrictions Precautions Precautions: Back Precaution Booklet Issued: Yes (comment) Precaution Comments: reviewed handout, verbal cues for maintenance of precautions during functional ADL Required Braces or Orthoses: Spinal Brace Spinal Brace: Lumbar corset;Applied in sitting position Restrictions Weight Bearing  Restrictions: No      Mobility Bed Mobility Overal bed mobility: Modified Independent             General bed mobility comments: able to give good demonstration of log roll technique  Transfers Overall transfer level: Needs assistance Equipment used: None Transfers: Sit to/from Stand Sit to Stand: Min guard         General transfer comment: vc for safe hand placement and cues to bring hips to the edge prior to standing    Balance Overall balance assessment: History of Falls;Mild deficits observed, not formally tested                                         ADL either performed or assessed with clinical judgement   ADL Overall ADL's : Needs assistance/impaired Eating/Feeding: Modified independent;Sitting   Grooming: Wash/dry hands;Wash/dry face;Min guard;Standing;Cueing for compensatory techniques Grooming Details (indicate cue type and reason): educated in compensatory strategies for sink level grooming (and referenced on handout as well) Upper Body Bathing: Sitting;Set up   Lower Body Bathing: Moderate assistance;With caregiver independent assisting;Sitting/lateral leans Lower Body Bathing Details (indicate cue type and reason): educated in long handle sponge Upper Body Dressing : Minimal assistance;Sitting Upper Body Dressing Details (indicate cue type and reason): able to don/doff brace with cues for sequencing Lower Body Dressing: Moderate assistance;Sit to/from stand Lower Body Dressing Details (indicate cue type and reason): unable to perform figure 4 for LB dressing - son and DIL available to assist Toilet Transfer: Min guard;Ambulation   Toileting- Clothing Manipulation and Hygiene:  Min guard;Sit to/from stand Toileting - Clothing Manipulation Details (indicate cue type and reason): for front peri care - educated on toilet aide for rear peri care Tub/ Shower Transfer: Walk-in shower;Min guard;Ambulation   Functional mobility during ADLs: Min  guard General ADL Comments: Pt with limited access to LB and decreased activity tolerance. Educated on energy conservation strategies as well as AE for LB ADL     Vision         Perception     Praxis      Pertinent Vitals/Pain Pain Assessment: Faces Faces Pain Scale: Hurts even more Pain Location: back Pain Descriptors / Indicators: Sore;Constant Pain Intervention(s): Monitored during session;Repositioned;Premedicated before session     Hand Dominance Right   Extremity/Trunk Assessment Upper Extremity Assessment Upper Extremity Assessment: Overall WFL for tasks assessed   Lower Extremity Assessment Lower Extremity Assessment: Defer to PT evaluation RLE Deficits / Details: Patient complains of "catch" in hip near groin area that was present before surgery   Cervical / Trunk Assessment Cervical / Trunk Assessment: Other exceptions Cervical / Trunk Exceptions: s/p lumbar sx   Communication Communication Communication: No difficulties   Cognition Arousal/Alertness: Awake/alert Behavior During Therapy: WFL for tasks assessed/performed Overall Cognitive Status: Within Functional Limits for tasks assessed                                     General Comments  Son present throughout session    Exercises     Shoulder Instructions      Home Living Family/patient expects to be discharged to:: Private residence Living Arrangements: Children;Alone Available Help at Discharge: Family Type of Home: House Home Access: Stairs to enter [employer] of Steps: 2 stairs to enter her and son's home Entrance Stairs-Rails: None Home Layout: One level     Bathroom Shower/Tub: Tub/shower unit;Walk-in shower   Bathroom Toilet: Standard Bathroom Accessibility: No   Home Equipment: Cane - single point;Walker - 2 wheels;Shower seat;Hand held shower head          Prior Functioning/Environment Level of Independence: Independent                  OT Problem List: Decreased activity tolerance;Impaired balance (sitting and/or standing);Decreased safety awareness;Decreased knowledge of use of DME or AE;Decreased knowledge of precautions;Pain      OT Treatment/Interventions:      OT Goals(Current goals can be found in the care plan section) Acute Rehab OT Goals Patient Stated Goal: to get back to walking without pain OT Goal Formulation: With patient/family Time For Goal Achievement: 01/08/18 Potential to Achieve Goals: Good ADL Goals Pt Will Perform Grooming: with modified independence;standing Pt Will Perform Lower Body Bathing: with modified independence;with adaptive equipment;sitting/lateral leans Pt Will Perform Lower Body Dressing: with modified independence;with adaptive equipment;sit to/from stand Pt Will Transfer to Toilet: with modified independence;ambulating Pt Will Perform Toileting - Clothing Manipulation and hygiene: with modified independence;with adaptive equipment;sit to/from stand  OT Frequency: Min 2X/week   Barriers to D/C:            Co-evaluation              AM-PAC PT "6 Clicks" Daily Activity     Outcome Measure Help from another person eating meals?: None Help from another person taking care of personal grooming?: A Little Help from another person toileting, which includes using toliet, bedpan, or urinal?: A Lot Help from another person bathing (  including washing, rinsing, drying)?: A Lot Help from another person to put on and taking off regular upper body clothing?: A Little Help from another person to put on and taking off regular lower body clothing?: A Lot 6 Click Score: 16   End of Session Equipment Utilized During Treatment: Gait belt;Back brace Nurse Communication: Mobility status;Precautions  Activity Tolerance: Patient tolerated treatment well Patient left: in bed;with call bell/phone within reach;with family/visitor present  OT Visit Diagnosis: Unsteadiness on feet  (R26.81);Pain Pain - part of body: (back)                Time: 5361-4431 OT Time Calculation (min): 19 min Charges:  OT General Charges $OT Visit: 1 Visit OT Evaluation $OT Eval Moderate Complexity: [place] OTR/L Acute Rehabilitation Services Pager: [phone] Office: [place] 12/25/2017, 10:13 AM

## 2017-12-26 DIAGNOSIS — I952 Hypotension due to drugs: Secondary | ICD-10-CM

## 2017-12-26 DIAGNOSIS — D62 Acute posthemorrhagic anemia: Secondary | ICD-10-CM

## 2017-12-26 DIAGNOSIS — M48062 Spinal stenosis, lumbar region with neurogenic claudication: Principal | ICD-10-CM

## 2017-12-26 LAB — BASIC METABOLIC PANEL
Anion gap: 5 (ref 5–15)
BUN: 12 mg/dL (ref 8–23)
CALCIUM: 8.2 mg/dL — AB (ref 8.9–10.3)
CO2: 24 mmol/L (ref 22–32)
CREATININE: 0.88 mg/dL (ref 0.44–1.00)
Chloride: 105 mmol/L (ref 98–111)
GFR calc Af Amer: >60 mL/min (ref >60)
GFR calc non Af Amer: 60 mL/min — ABNORMAL LOW (ref >60)
Glucose, Bld: 128 mg/dL — ABNORMAL HIGH (ref 70–99)
Potassium: 3.8 mmol/L (ref 3.5–5.1)
SODIUM: 134 mmol/L — AB (ref 135–145)

## 2017-12-26 LAB — CBC
HCT: 26 % — ABNORMAL LOW (ref 36.0–46.0)
Hemoglobin: 8.6 g/dL — ABNORMAL LOW (ref 12.0–15.0)
MCH: 31 pg (ref 26.0–34.0)
MCHC: 33.1 g/dL (ref 30.0–36.0)
MCV: 93.9 fL (ref 80.0–100.0)
Platelets: 115 10*3/uL — ABNORMAL LOW (ref 150–400)
RBC: 2.77 MIL/uL — ABNORMAL LOW (ref 3.87–5.11)
RDW: 13.1 % (ref 11.5–15.5)
WBC: 8.7 10*3/uL (ref 4.0–10.5)
nRBC: 0 % (ref 0.0–0.2)

## 2017-12-26 LAB — PREPARE RBC (CROSSMATCH)

## 2017-12-26 LAB — TROPONIN I
TROPONIN I: 0.06 ng/mL — AB (ref <0.03)
TROPONIN I: 0.07 ng/mL — AB (ref <0.03)

## 2017-12-26 MED ORDER — SODIUM CHLORIDE 0.9% IV SOLUTION
Freq: Once | INTRAVENOUS | Status: AC
  Administered 2017-12-26: 17:00:00 via INTRAVENOUS

## 2017-12-26 MED ORDER — SODIUM CHLORIDE 0.9 % IV SOLN
INTRAVENOUS | Status: DC
  Administered 2017-12-26: 07:00:00 via INTRAVENOUS

## 2017-12-26 NOTE — Progress Notes (Signed)
Physical Therapy Treatment Patient Details Name: Belinda Brown MRN: [id number] DOB: 1934/06/29 Today's Date: 12/26/2017    History of Present Illness Patient is an 83 year old female s/p lumbar decompression and fusion of L3-4 and L4-5 on 12/24/17 after an 8 month history of progressive back pain, 2 month history of R lower extremity pain and a history of falls. She has a history of non obstructive coronary artery atherosclerosis, decreased EF, mild valve regurgitation which may affect her tolerance to exercise. Additionally, previous cervical laminectomy.     PT Comments    Pt progressing towards physical therapy goals. Overall pt moving slow and guarded this morning, stating "I just don't feel good." Education provided on back precautions, brace wearing schedule, and general safety with activity progression upon return home. Will continue to follow and progress as able per POC.    Follow Up Recommendations  No PT follow up;Supervision for mobility/OOB     Equipment Recommendations  None recommended by PT    Recommendations for Other Services       Precautions / Restrictions Precautions Precautions: Back Precaution Booklet Issued: Yes (comment) Precaution Comments: Pt was cued for maintenance of precautions during functional mobility.  Required Braces or Orthoses: Spinal Brace Spinal Brace: Lumbar corset;Applied in sitting position Restrictions Weight Bearing Restrictions: No    Mobility  Bed Mobility               General bed mobility comments: Pt received sitting on EOB.   Transfers Overall transfer level: Needs assistance Equipment used: Rolling walker (2 wheeled) Transfers: Sit to/from Stand Sit to Stand: Min guard         General transfer comment: vc for safe hand placement and cues to bring hips to the edge prior to standing  Ambulation/Gait Ambulation/Gait assistance: Supervision Gait Distance (Feet): 200 Feet Assistive device: Rolling walker (2  wheeled) Gait Pattern/deviations: Trunk flexed;Step-through pattern;Decreased stride length Gait velocity: Decreased Gait velocity interpretation: <1.31 ft/sec, indicative of household ambulator General Gait Details: Slow, guarded gait due to pain. Pt reports improvement in pain with RW use. Supervision for safety.   Stairs             Wheelchair Mobility    Modified Rankin (Stroke Patients Only)       Balance Overall balance assessment: History of Falls;Mild deficits observed, not formally tested                                          Cognition Arousal/Alertness: Awake/alert Behavior During Therapy: WFL for tasks assessed/performed Overall Cognitive Status: Within Functional Limits for tasks assessed                                        Exercises      General Comments        Pertinent Vitals/Pain Pain Assessment: Faces Faces Pain Scale: Hurts even more Pain Location: back Pain Descriptors / Indicators: Sore;Constant Pain Intervention(s): Monitored during session    Home Living Family/patient expects to be discharged to:: Private residence Living Arrangements: Children;Alone Available Help at Discharge: Family Type of Home: House Home Access: Stairs to enter Entrance Stairs-Rails: None Home Layout: One level Home Equipment: Cane - single point;Walker - 2 wheels;Shower seat;Hand held shower head      Prior Function Level of  Independence: Independent          PT Goals (current goals can now be found in the care plan section) Acute Rehab PT Goals Patient Stated Goal: "I just want to feel better." PT Goal Formulation: With patient/family Time For Goal Achievement: 01/08/18 Potential to Achieve Goals: Good Progress towards PT goals: Progressing toward goals    Frequency    Min 5X/week      PT Plan Current plan remains appropriate    Co-evaluation              AM-PAC PT "6 Clicks" Daily Activity   Outcome Measure  Difficulty turning over in bed (including adjusting bedclothes, sheets and blankets)?: Unable Difficulty moving from lying on back to sitting on the side of the bed? : Unable Difficulty sitting down on and standing up from a chair with arms (e.g., wheelchair, bedside commode, etc,.)?: Unable Help needed moving to and from a bed to chair (including a wheelchair)?: A Little Help needed walking in hospital room?: A Little Help needed climbing 3-5 steps with a railing? : A Little 6 Click Score: 12    End of Session Equipment Utilized During Treatment: Gait belt;Back brace Activity Tolerance: Patient tolerated treatment well Patient left: Other (comment)(Standing in bathroom with OT present) Nurse Communication: Mobility status PT Visit Diagnosis: Repeated falls (R29.6);Muscle weakness (generalized) (M62.81);Other abnormalities of gait and mobility (R26.89)     Time: 8032-1224 PT Time Calculation (min) (ACUTE ONLY): 16 min  Charges:  $Gait Training: 8-22 mins                     Rolinda Roan, PT, DPT Acute Rehabilitation Services Pager: [phone] Office: [phone]    Thelma Comp 12/26/2017, 10:26 AM

## 2017-12-26 NOTE — Progress Notes (Signed)
CRITICAL VALUE ALERT  Critical Value:  Troponin-0.06 Date & Time Notied:  12/26/2017 at [place]  Provider Notified: Viona Gilmore NP  Orders Received/Actions taken: Awaiting response

## 2017-12-26 NOTE — Progress Notes (Signed)
Patient is transferred from room 3C07 to unit 4NP05 at this time. Alert and in stable condition. 1st unit of PRBC transfusing at this time with no adverse effect or reaction noted. Report given to receiving nurse Jarrett Soho, RN with all questions answered. Patient and family agreed with transfer. Transported via bed with all belongings at side.

## 2017-12-26 NOTE — Progress Notes (Signed)
Postop day 2.  Patient's back pain better.  No lower extremity pain.  Still struggling with hypertension.  Patient does note some orthostasis.  Afebrile.  Systolic blood pressure 90.  Urine output good.  Awake and alert.  Oriented and appropriate.  Skin warm and dry.  Abdomen soft.  Motor and sensory functioning intact.  Overall progressing fairly well following 2 level lumbar decompression and fusion.  Patient ready for discharge home aside from her hypertension.  Plan to check follow-up labs today to make sure the patient is not significantly anemic.  Continue efforts at mobilization.  Usually this transient postop hypotension will resolve over the next 24 hours.

## 2017-12-26 NOTE — Progress Notes (Signed)
Spoke with Dr. [place] of Cardiology. He will assess the patient due to hypotension and troponin of 0.06 and make further recommendations if needed. Hydrochlorothiazide and losartan have not been administered by nursing and are now discontinued.

## 2017-12-26 NOTE — Consult Note (Addendum)
Cardiology Consultation:   Patient ID: Belinda Brown MRN: [id number]; DOB: 1934-03-03  Admit date: 12/24/2017 Date of Consult: 12/26/2017  Primary Care Provider: Randel Books, FNP Primary Cardiologist: Glenetta Hew, MD  Primary Electrophysiologist:  None   Patient Profile:   Belinda Brown is a 83 y.o. female with a hx of mild nonobstructive CAD by cath in 2008, low risk NST in 2018, G1DD, HTN, HLD, thymoma s/p resection and lumbar DJD and radiculopathy, admitted for lumbar fusion, who is being seen today for the evaluation of post operative hypotension and elevated troponin, at the request of Dr. Annette Stable, neurosurgery.  History of Present Illness:   Belinda Brown used to be followed by Dr. Geraldo Pitter but transitioned to Dr. Ellyn Hack in 2018. Per clinic notes, she had a heart catheterization 2008 which showed roughly 50% disease (unfortunately do not have the report for review. She also had a stress echocardiogram done in 2016 that was presumably normal). She also has a history of thymoma, and there was plans for her to have a CT scan of the chest done to assess her potential thymoma issues as well as potential hilar hernia. She apparently has since undergone resection as outlined in her PMH.   She was last seen by Dr. Ellyn Hack in clinic in 2018 and complained of fatigue, chest discomfort and dizziness. No palpitations. Dr. Ellyn Hack ordered a coronary CTA. This was not approved by insurance. Subsequently, a NST was ordered and performed 06/19/16. Study was normal. No ischemia or infarction. She also had a 2D echo at that time that showed normal LVEF at 55-60% and mild DD. Additional PMH includes HTN and HLD.   She was admitted by neurosurgery on 12/24/17 for elective lumbar fusion to treat symptomatic DJD and radiculopathy. Surgery went at plan and no immediate complications, however she has had borderline hypotension post op but largely asymptomatic. She is a bit dizzy when she stands but no syncope/  near syncope. She also denies CP and dyspnea.  SBPs have been in the 90s. Her home antihypertensives (HCTZ and losartan both on hold). There is post operative anemia. Hgb 7 days ago was 13.4. Hgb is 8.6 today. Plt ct also down from 249 preop to 115 today. ASA also on hold. In addition to her hypotension, a troponin I was checked and abnormal at 0.06.  Cardiology asked to assist w/ hypotension and abnormal troponin. Preop EKG on 11/6 showed NSR with nonspecific ST/ Twave abnormalties. No repeat EKG has been obtained post op.   Past Medical History:  Diagnosis Date  . Arthritis    back & L thumb  . Hypercholesteremia   . Hypertension   . Nonobstructive atherosclerosis of coronary artery 2008   Nonobstructive disease by cath  . Thymoma 11/24/2005   s/p resection of thymic mass    Past Surgical History:  Procedure Laterality Date  . CARDIAC CATHETERIZATION  2008   [facility]. - Dr. Geraldo Pitter -- non-obstructive CAD (by pt report - ~50% single vessel)  . CERVICAL LAMINECTOMY  1986  . EYE SURGERY     cataracts removed- bilateral   . NM MYOVIEW LTD  06/23/2016   EF 30-35% with severe hypokinesis of the distal septal, distal inferior, mid/distal anterior, mid/distal anterolateral walls and Akinesis of the apex. The cavity size was normal. Wall thickness was normal. Trivial Aortic regurgitation. Mitral Mild MR  . partial mediastinoscopy with thymectomy and resection of thymic mass  11/24/2005   [facility]  . TRANSTHORACIC ECHOCARDIOGRAM  06/19/2016  Normal LV size and function. EF 55-60%. GR 1 DD. No regional wall motion abnormalities. Mild (grade 1) DD. Mild LVH and trace AI,MR,TR  . Treadmill Stress Echo  2016   [facility]: report not avaialble.  Pt reports - Normal result  . TUBAL LIGATION       Home Medications:  Prior to Admission medications   Medication Sig Start Date End Date Taking? Authorizing Provider  acetaminophen (TYLENOL) 650 MG CR tablet Take 650-1,300 mg by mouth every  8 (eight) hours as needed for pain.   Yes [provider]  aspirin EC 81 MG tablet Take 81 mg by mouth daily.   Yes [provider]  cholecalciferol (VITAMIN D) 1000 units tablet Take 1,000 Units by mouth daily.   Yes [provider]  fish oil-omega-3 fatty acids 1000 MG capsule Take 1 g by mouth daily.    Yes [provider]  hydrochlorothiazide (HYDRODIURIL) 12.5 MG tablet Take 12.5 mg by mouth daily. 10/02/17  Yes [provider]  losartan (COZAAR) 50 MG tablet Take 50 mg by mouth daily. 10/02/17  Yes [provider]  naproxen sodium (ALEVE) 220 MG tablet Take 440 mg by mouth 2 (two) times daily as needed.   Yes [provider]  vitamin B-12 (CYANOCOBALAMIN) 500 MCG tablet Take 500 mcg by mouth daily.   Yes [provider]  vitamin C (ASCORBIC ACID) 500 MG tablet Take 500 mg by mouth daily.     Yes [provider]  rosuvastatin (CRESTOR) 10 MG tablet Take 10 mg by mouth daily.    [provider]    Inpatient Medications: Scheduled Meds: . cholecalciferol  1,000 Units Oral Daily  . omega-3 acid ethyl esters  1 g Oral Daily  . rosuvastatin  10 mg Oral Daily  . sodium chloride flush  3 mL Intravenous Q12H  . vitamin B-12  500 mcg Oral Daily  . vitamin C  500 mg Oral Daily   Continuous Infusions: . sodium chloride    . sodium chloride 100 mL/hr at 12/26/17 0638   PRN Meds: acetaminophen **OR** acetaminophen, bisacodyl, diazepam, HYDROcodone-acetaminophen, HYDROcodone-acetaminophen, HYDROmorphone (DILAUDID) injection, hydrOXYzine, menthol-cetylpyridinium **OR** phenol, methocarbamol, ondansetron **OR** ondansetron (ZOFRAN) IV, oxyCODONE, oxyCODONE, polyethylene glycol, sodium chloride flush, sodium phosphate  Allergies:    Allergies  Allergen Reactions  . Ampicillin Hives    Has patient had a PCN reaction causing immediate rash, facial/tongue/throat swelling, SOB or lightheadedness with hypotension:  Yes Has patient had a PCN reaction causing severe rash involving mucus membranes or skin necrosis: No Has patient had a PCN reaction that required hospitalization: No Has patient had a PCN reaction occurring within the last 10 years: No If all of the above answers are "NO", then may proceed with Cephalosporin use.   . Codeine Other (See Comments)    hallucinations    Social History:   Social History   Socioeconomic History  . Marital status: Married    Spouse name: Not on file  . Number of children: Not on file  . Years of education: Not on file  . Highest education level: Not on file  Occupational History  . Not on file  Social Needs  . Financial resource strain: Not on file  . Food insecurity:    Worry: Not on file    Inability: Not on file  . Transportation needs:    Medical: Not on file    Non-medical: Not on file  Tobacco Use  . Smoking status: Never Smoker  .  Smokeless tobacco: Never Used  Substance and Sexual Activity  . Alcohol use: Yes    Comment: not on a regular basis  . Drug use: No  . Sexual activity: Not on file  Lifestyle  . Physical activity:    Days per week: Not on file    Minutes per session: Not on file  . Stress: Not on file  Relationships  . Social connections:    Talks on phone: Not on file    Gets together: Not on file    Attends religious service: Not on file    Active member of club or organization: Not on file    Attends meetings of clubs or organizations: Not on file    Relationship status: Not on file  . Intimate partner violence:    Fear of current or ex partner: Not on file    Emotionally abused: Not on file    Physically abused: Not on file    Forced sexual activity: Not on file  Other Topics Concern  . Not on file  Social History Narrative  . Not on file    Family History:    Family History  Problem Relation Age of Onset  . Congestive Heart Failure Mother   . Heart attack Father   . Cancer Sister   . COPD Brother   .  Heart failure Maternal Grandmother   . Pneumonia Maternal Grandfather   . Heart failure Paternal Grandmother   . Heart failure Paternal Grandfather      ROS:  Please see the history of present illness.   All other ROS reviewed and negative.     Physical Exam/Data:   Vitals:   12/26/17 0540 12/26/17 0636 12/26/17 0728 12/26/17 1230  BP: (!) 98/49 (!) 93/54 (!) 94/39 (!) 98/47  Pulse: 84 71 72 78  Resp:   16 16  Temp:   99.1 F (37.3 C) 98.1 F (36.7 C)  TempSrc:   Oral Oral  SpO2: 99% 99% 92% 100%  Weight:      Height:       No intake or output data in the 24 hours ending 12/26/17 1247 Filed Weights   12/24/17 0633  Weight: 64.2 kg   Body mass index is 23.55 kg/m.  General:  Well nourished, well developed, in no acute distress HEENT: normal Lymph: no adenopathy Neck: no JVD Endocrine:  No thryomegaly Vascular: No carotid bruits; FA pulses 2+ bilaterally without bruits  Cardiac:  normal S1, S2; RRR; no murmur  Lungs:  clear to auscultation bilaterally, no wheezing, rhonchi or rales  Abd: soft, nontender, no hepatomegaly  Ext: no edema Musculoskeletal:  No deformities, BUE and BLE strength normal and equal Skin: warm and dry  Neuro:  CNs 2-12 intact, no focal abnormalities noted Psych:  Normal affect   EKG:  The EKG was personally reviewed and demonstrates:  12/19/17 NSR with nonspecific ST and T wave abnormalities. Repeat EKG today shows NSR. No ischemic abnormalities.   Telemetry:  Telemetry was personally reviewed and demonstrates:  N/A, currently not on tele unit.   Relevant CV Studies: 2D Echo 06/19/16 Study Conclusions  - Left ventricle: The cavity size was normal. Wall thickness was   increased in a pattern of mild LVH. Systolic function was normal.   The estimated ejection fraction was in the range of 55% to 60%.   Wall motion was normal; there were no regional wall motion   abnormalities. Doppler parameters are consistent with abnormal   left  ventricular  relaxation (grade 1 diastolic dysfunction). - Aortic valve: There was trivial regurgitation.  Impressions:  - Normal LV systolic function; mild diastolic dysfunction; mild   LVH; trace AI, MR and TR.  NST 06/23/16 Study Highlights    Nuclear stress EF: 69%.  Normal perfusion  This is a low risk study.    Laboratory Data:  Chemistry Recent Labs  Lab 12/26/17 0821  NA 134*  K 3.8  CL 105  CO2 24  GLUCOSE 128*  BUN 12  CREATININE 0.88  CALCIUM 8.2*  GFRNONAA 60*  GFRAA >60  ANIONGAP 5    No results for input(s): PROT, ALBUMIN, AST, ALT, ALKPHOS, BILITOT in the last 168 hours. Hematology Recent Labs  Lab 12/26/17 0821  WBC 8.7  RBC 2.77*  HGB 8.6*  HCT 26.0*  MCV 93.9  MCH 31.0  MCHC 33.1  RDW 13.1  PLT 115*   Cardiac Enzymes Recent Labs  Lab 12/26/17 0821  TROPONINI 0.06*   No results for input(s): TROPIPOC in the last 168 hours.  BNPNo results for input(s): BNP, PROBNP in the last 168 hours.  DDimer No results for input(s): DDIMER in the last 168 hours.  Radiology/Studies:  Dg Lumbar Spine 2-3 Views  Result Date: 12/24/2017 CLINICAL DATA:  Posterior lumbar interbody fusion at L3-4 and L4-5 for spondylolisthesis. Reported fluoro time is 49 seconds. Two fluoro spot images are submitted. EXAM: DG C-ARM 61-120 MIN; LUMBAR SPINE - 2-3 VIEW COMPARISON:  Preoperative lumbar spine series of October 31st 2019 FINDINGS: The patient has undergone placement of pedicle screws from L3 through L5 with intradiscal devices at L3-4 and L4-5. Connecting rods have not yet been placed to link the pedicle screws. IMPRESSION: Intraoperative fluoro spot images reveal ongoing PLIF at L3-4 and L4-5. Electronically Signed   By: David  Martinique M.D.   On: 12/24/2017 13:28   Dg C-arm 1-60 Min  Result Date: 12/24/2017 CLINICAL DATA:  Posterior lumbar interbody fusion at L3-4 and L4-5 for spondylolisthesis. Reported fluoro time is 49 seconds. Two fluoro spot images are  submitted. EXAM: DG C-ARM 61-120 MIN; LUMBAR SPINE - 2-3 VIEW COMPARISON:  Preoperative lumbar spine series of October 31st 2019 FINDINGS: The patient has undergone placement of pedicle screws from L3 through L5 with intradiscal devices at L3-4 and L4-5. Connecting rods have not yet been placed to link the pedicle screws. IMPRESSION: Intraoperative fluoro spot images reveal ongoing PLIF at L3-4 and L4-5. Electronically Signed   By: David  Martinique M.D.   On: 12/24/2017 13:28    Assessment and Plan:   Belinda Brown is a 83 y.o. female with a hx of mild nonobstructive CAD by cath in 2008, low risk NST in 2018, G1DD, HTN, HLD, thymoma s/p resection and lumbar DJD and radiculopathy, admitted for lumbar fusion, who is being seen today for the evaluation of post operative hypotension and elevated troponin, at the request of Dr. Annette Stable, neurosurgery.  1. Hypotension: SBPs have been soft, in the 90s. She did have one BP reading in the 80s (84/43). Most recent BP 98/47. She is dizzy upon standing but no syncope/ near syncope. Also denies CP and dyspnea.  All of her home antihypertensives (HCTZ and losartan) are on hold. She is anemic and has had significant drop in Hgb post op. Preop labs 7 days ago showed Hgb at 13.4. Her Hgb today is 8.6. Platelet count also down from 249 to 115. ASA on hold. I suspect her hypotension is 2/2 acute blood loss anemia. Recommend transfusion. Continue  to hold home antihypertensive agents for now. Monitor BP.   2. Abnormal Troponin: Troponin I today was 0.06. She denies CP. Given anemia and hypotension, I suspect her elevated troponin is likely 2/2 demand ischemia. However will repeat troponin to ensure no significant increase. Will also obtain 12 lead EKG to ensure no interval change. Preop EKG on 11/6 showed NSR with nonspecific ST and Twave abnormalities.   3. Anemia: Drop in Hgb from 13.4>>8.6 post lumbar surgery. Given symptomatic hypotension, recommend blood transfusion.   4.  Lumbar DJD + Radiculopathy: day 2 s/p lumbar fusion. Post op management per neurosurgery.   For questions or updates, please contact [place] Please consult [email or web address] for contact info under   Signed, Lyda Jester, PA-C  12/26/2017 12:47 PM   The patient was seen, examined and discussed with Brittainy M. Rosita Fire, PA-C and I agree with the above.   83 y.o. female with a hx of mild nonobstructive CAD by cath in 2008, low risk NST in 2018, G1DD, HTN, HLD, thymoma s/p resection and lumbar DJD and radiculopathy, admitted for lumbar fusion, who is being seen today for the evaluation of post operative hypotension and elevated troponin.  The patient underwent elective lumbar fusion on 12/24/2017, she developed hypotension down to 80s that is symptomatic with dizziness and weakness no falls.  Upon review of her chart I have noticed that patient has received significant amount of narcotics, also the patient admits that she has had a decreased oral intake, she also developed significant anemia, her baseline hemoglobin of 13-15 is currently at 8.6. Her home hydrochlorothiazide and losartan are being held and I agree with that.  I would continue to hold.  On physical exam she is not in acute distress, laying comfortably in bed, she has no JVDs, S1-S2 no significant murmurs lungs are clear, she is tenderness in her back, and no lower extremity edema with good peripheral pulses.  Her troponin is minimally elevated at 0.06 and most probably secondary to demand ischemia secondary to anemia and hypotension.  I would recommend to consider blood transfusion 1 to 2 units, continue holding her blood pressure medications and encourage fluid intake.  Ena Dawley, MD 12/26/2017

## 2017-12-26 NOTE — Progress Notes (Signed)
Patient arrived to unit, BP stable, pt with temp. Pain under control.  Pt A&O x4. 1 of 2 unit PRBC infusing at time of transfer.  Continue to monitor patient.

## 2017-12-26 NOTE — Progress Notes (Signed)
Occupational Therapy Treatment Patient Details Name: Belinda Brown MRN: [id number] DOB: [birth year] Today's Date: 12/26/2017    History of present illness Patient is an 83 year old female s/p lumbar decompression and fusion of L3-4 and L4-5 on 12/24/17 after an 8 month history of progressive back pain, 2 month history of R lower extremity pain and a history of falls. She has a history of non obstructive coronary artery atherosclerosis, decreased EF, mild valve regurgitation which may affect her tolerance to exercise. Additionally, previous cervical laminectomy.    OT comments  Pt progressing towards OT goals today. RW used this session for additional pain management and cues throughout for back precautions. Pt able to complete sink level grooming, as well as AE education for LB ADL. Pt continues to benefit from skilled OT in the acute setting, current POC remains appropriate.   Follow Up Recommendations  Supervision/Assistance - 24 hour    Equipment Recommendations  None recommended by OT(Pt has appropriate DME)    Recommendations for Other Services      Precautions / Restrictions Precautions Precautions: Back Precaution Booklet Issued: Yes (comment) Precaution Comments: Pt was cued for maintenance of precautions during functional mobility.  Required Braces or Orthoses: Spinal Brace Spinal Brace: Lumbar corset;Applied in sitting position Restrictions Weight Bearing Restrictions: No       Mobility Bed Mobility               General bed mobility comments: Pt received walking in hallway and left in recliner  Transfers Overall transfer level: Needs assistance Equipment used: Rolling walker (2 wheeled) Transfers: Sit to/from Stand Sit to Stand: Min guard         General transfer comment: vc for safe hand placement and cues to bring hips to the edge prior to standing    Balance Overall balance assessment: History of Falls;Mild deficits observed, not formally tested                                          ADL either performed or assessed with clinical judgement   ADL Overall ADL's : Needs assistance/impaired     Grooming: Wash/dry hands;Wash/dry face;Oral care;Supervision/safety;Standing Grooming Details (indicate cue type and reason): cues for compensatory strategies at sink level             Lower Body Dressing: Moderate assistance;Sit to/from stand Lower Body Dressing Details (indicate cue type and reason): educated in [place] Transfer: Min guard;Ambulation;RW Toilet Transfer Details (indicate cue type and reason): increased pain, using RW today Toileting- Clothing Manipulation and Hygiene: Min guard;Sit to/from stand Toileting - Clothing Manipulation Details (indicate cue type and reason): for front peri care - educated on toilet aide for rear peri care     Functional mobility during ADLs: Min guard;Rolling walker General ADL Comments: Pt with limited access to LB and decreased activity tolerance. Educated on energy conservation strategies as well as AE for LB ADL     Vision       Perception     Praxis      Cognition Arousal/Alertness: Awake/alert Behavior During Therapy: WFL for tasks assessed/performed Overall Cognitive Status: Within Functional Limits for tasks assessed  Exercises     Shoulder Instructions       General Comments daughter in law present at the end of session    Pertinent Vitals/ Pain       Pain Assessment: Faces Faces Pain Scale: Hurts even more Pain Location: back Pain Descriptors / Indicators: Sore;Constant Pain Intervention(s): Monitored during session;Repositioned  Home Living                                          Prior Functioning/Environment              Frequency  Min 2X/week        Progress Toward Goals  OT Goals(current goals can now be found in the care plan section)   Progress towards OT goals: Progressing toward goals  Acute Rehab OT Goals Patient Stated Goal: "I just want to feel better." OT Goal Formulation: With patient/family Time For Goal Achievement: 01/08/18 Potential to Achieve Goals: Good  Plan Discharge plan remains appropriate    Co-evaluation                 AM-PAC PT "6 Clicks" Daily Activity     Outcome Measure   Help from another person eating meals?: None Help from another person taking care of personal grooming?: A Little Help from another person toileting, which includes using toliet, bedpan, or urinal?: A Lot Help from another person bathing (including washing, rinsing, drying)?: A Lot Help from another person to put on and taking off regular upper body clothing?: A Little Help from another person to put on and taking off regular lower body clothing?: A Lot 6 Click Score: 16    End of Session Equipment Utilized During Treatment: Gait belt;Back brace;Rolling walker  OT Visit Diagnosis: Unsteadiness on feet (R26.81);Pain Pain - part of body: (back)   Activity Tolerance Patient tolerated treatment well   Patient Left with call bell/phone within reach;with family/visitor present;in chair   Nurse Communication Mobility status;Precautions        Time: 8657-8469 OT Time Calculation (min): 26 min  Charges: OT General Charges $OT Visit: 1 Visit OT Treatments $Self Care/Home Management : 23-37 mins  Hulda Humphrey OTR/L Acute Rehabilitation Services Pager: [phone] Office: [place] 12/26/2017, 2:43 PM

## 2017-12-27 LAB — BPAM RBC
BLOOD PRODUCT EXPIRATION DATE: 201912112359
Blood Product Expiration Date: 201912112359
ISSUE DATE / TIME: 201911131642
ISSUE DATE / TIME: 201911131959
UNIT TYPE AND RH: 5100
Unit Type and Rh: 5100

## 2017-12-27 LAB — TYPE AND SCREEN
ABO/RH(D): O POS
Antibody Screen: NEGATIVE
UNIT DIVISION: 0
Unit division: 0

## 2017-12-27 LAB — HEMOGLOBIN AND HEMATOCRIT, BLOOD
HEMATOCRIT: 28.5 % — AB (ref 36.0–46.0)
HEMOGLOBIN: 9.6 g/dL — AB (ref 12.0–15.0)

## 2017-12-27 MED ORDER — HYDROCODONE-ACETAMINOPHEN 10-325 MG PO TABS: 1.0000 | ORAL_TABLET | ORAL | 0 refills | Status: DC | PRN

## 2017-12-27 MED ORDER — METHOCARBAMOL 500 MG PO TABS: 500.0000 mg | ORAL_TABLET | Freq: Four times a day (QID) | ORAL | 0 refills | Status: DC | PRN

## 2017-12-27 NOTE — Progress Notes (Signed)
Physical Therapy Treatment Patient Details Name: Belinda Brown MRN: [id number] DOB: 1934-07-01 Today's Date: 12/27/2017    History of Present Illness Patient is an 83 year old female s/p lumbar decompression and fusion of L3-4 and L4-5 on 12/24/17 after an 8 month history of progressive back pain, 2 month history of R lower extremity pain and a history of falls. She has a history of non obstructive coronary artery atherosclerosis, decreased EF, mild valve regurgitation which may affect her tolerance to exercise. Additionally, previous cervical laminectomy.     PT Comments    Pt progressing well with gait and mobility, practiced the stairs today again with her son's assist with cues for safe guarding technique.  They did fine working together simulating home entry stairs.  PT assisted pt in getting dressed and read for d/c.  PT will continue to follow acutely for safe mobility progression   Follow Up Recommendations  No PT follow up;Supervision for mobility/OOB     Equipment Recommendations  None recommended by PT    Recommendations for Other Services   NA     Precautions / Restrictions Precautions Precautions: Back Required Braces or Orthoses: Spinal Brace Spinal Brace: Lumbar corset;Applied in sitting position    Mobility  Bed Mobility Overal bed mobility: Needs Assistance Bed Mobility: Rolling;Sidelying to Sit;Sit to Sidelying Rolling: Supervision Sidelying to sit: Supervision     Sit to sidelying: Supervision General bed mobility comments: supervision for safety, moderate reliance on bed rail for transitions.   Transfers Overall transfer level: Needs assistance Equipment used: Rolling walker (2 wheeled) Transfers: Sit to/from Stand Sit to Stand: Min guard         General transfer comment: Min guard asisst for safety, verbal cues for safe hand placement during transitions.   Ambulation/Gait Ambulation/Gait assistance: Min guard Gait Distance (Feet): 200  Feet Assistive device: Rolling walker (2 wheeled) Gait Pattern/deviations: Trunk flexed;Step-through pattern;Decreased stride length Gait velocity: Decreased Gait velocity interpretation: 1.31 - 2.62 ft/sec, indicative of limited community ambulator General Gait Details: slow, guarded gait pattern, minimal trunk flexion, cues for upright posture, but pt unable to correct, flexed trunk may be pre morbid.    Stairs Stairs: Yes Stairs assistance: Min assist Stair Management: No rails;Step to pattern;Forwards Number of Stairs: 3 General stair comments: Holding son's hand simulating home entry, pt was able to go up and down three steps with his support and step to pattern, she reported she did not feel there was a stonger leg, so I did not have her lead up or down with any particular leg.           Balance Overall balance assessment: Needs assistance Sitting-balance support: Feet supported;No upper extremity supported Sitting balance-Leahy Scale: Good     Standing balance support: Bilateral upper extremity supported;Single extremity supported Standing balance-Leahy Scale: Fair                              Cognition Arousal/Alertness: Awake/alert Behavior During Therapy: WFL for tasks assessed/performed Overall Cognitive Status: Within Functional Limits for tasks assessed                                           General Comments General comments (skin integrity, edema, etc.): son present for the entire session.       Pertinent Vitals/Pain Pain Assessment: Faces Faces Pain  Scale: Hurts even more Pain Location: back Pain Descriptors / Indicators: Aching Pain Intervention(s): Monitored during session;Limited activity within patient's tolerance;Repositioned           PT Goals (current goals can now be found in the care plan section) Acute Rehab PT Goals Patient Stated Goal: to go home today Progress towards PT goals: Progressing toward  goals    Frequency    Min 5X/week      PT Plan Current plan remains appropriate       AM-PAC PT "6 Clicks" Daily Activity  Outcome Measure  Difficulty turning over in bed (including adjusting bedclothes, sheets and blankets)?: Unable Difficulty moving from lying on back to sitting on the side of the bed? : Unable Difficulty sitting down on and standing up from a chair with arms (e.g., wheelchair, bedside commode, etc,.)?: Unable Help needed moving to and from a bed to chair (including a wheelchair)?: A Little Help needed walking in hospital room?: A Little Help needed climbing 3-5 steps with a railing? : A Little 6 Click Score: 12    End of Session Equipment Utilized During Treatment: Gait belt;Back brace Activity Tolerance: Patient limited by pain Patient left: in bed;with call bell/phone within reach;with family/visitor present Nurse Communication: Mobility status PT Visit Diagnosis: Repeated falls (R29.6);Muscle weakness (generalized) (M62.81);Other abnormalities of gait and mobility (R26.89)     Time: 9244-6286 PT Time Calculation (min) (ACUTE ONLY): 28 min  Charges:  $Gait Training: 23-37 mins                    Yarenis Cerino B. Salah Nakamura, PT, DPT  Acute Rehabilitation [phone] pager #[phone] office   12/27/2017, 5:19 PM

## 2017-12-27 NOTE — Discharge Summary (Signed)
Physician Discharge Summary  Patient ID: Belinda Brown MRN: [id number] DOB/AGE: [birth year] 83 y.o.  Admit date: 12/24/2017 Discharge date: 12/27/2017  Admission Diagnoses:  Discharge Diagnoses:  Active Problems:   Lumbar stenosis with neurogenic claudication   Discharged Condition: good  Hospital Course: Patient admitted to the hospital where she underwent an uncomplicated 2 level lumbar decompression and fusion.  Postoperatively the patient has had good relief of her lumbar radicular pain and symptoms of neurogenic claudication.  She has had the expected amount of lower back pain.  She has had difficulty with hypotension after surgery.  Follow-up hematocrit demonstrated moderate acute blood loss anemia.  Cardiology evaluated patient and recommended transfusion.  After transfusion patient's blood pressure and perfusion is been better.  Patient feels comfortable with discharge home.  Consults:   Significant Diagnostic Studies:   Treatments:   Discharge Exam: Blood pressure 113/78, pulse 77, temperature 98.8 F (37.1 C), temperature source Oral, resp. rate 16, height 5\' 5"  (1.651 m), weight 64.2 kg, SpO2 100 %. Awake and alert.  Oriented and appropriate.  Cranial nerve function intact.  Motor and sensory function extremities normal.  Wound clean and dry.  Chest and abdomen benign.  Disposition: Discharge disposition: 01-Home or Self Care       Discharge Instructions    Face-to-face encounter (required for Medicare/Medicaid patients)   Complete by:  As directed    I Charlie Pitter certify that this patient is under my care and that I, or a nurse practitioner or physician's assistant working with me, had a face-to-face encounter that meets the physician face-to-face encounter requirements with this patient on 12/27/2017. The encounter with the patient was in whole, or in part for the following medical condition(s) which is the primary reason for home health care (List medical  condition): Degenerative spondylolisthesis   The encounter with the patient was in whole, or in part, for the following medical condition, which is the primary reason for home health care:  Degenerative spondylolisthesis   I certify that, based on my findings, the following services are medically necessary home health services:  Physical therapy   Reason for Medically Necessary Home Health Services:  Therapy- Personnel officer, Public librarian   My clinical findings support the need for the above services:  Unable to leave home safely without assistance and/or assistive device   Further, I certify that my clinical findings support that this patient is homebound due to:  Pain interferes with ambulation/mobility   Home Health   Complete by:  As directed    To provide the following care/treatments:   PT OT       Allergies as of 12/27/2017      Reactions   Ampicillin Hives   Has patient had a PCN reaction causing immediate rash, facial/tongue/throat swelling, SOB or lightheadedness with hypotension: Yes Has patient had a PCN reaction causing severe rash involving mucus membranes or skin necrosis: No Has patient had a PCN reaction that required hospitalization: No Has patient had a PCN reaction occurring within the last 10 years: No If all of the above answers are "NO", then may proceed with Cephalosporin use.   Codeine Other (See Comments)   hallucinations      Medication List    TAKE these medications   acetaminophen 650 MG CR tablet Commonly known as:  TYLENOL Take 650-1,300 mg by mouth every 8 (eight) hours as needed for pain.   aspirin EC 81 MG tablet Take 81 mg by mouth daily.  cholecalciferol 25 MCG (1000 UT) tablet Commonly known as:  VITAMIN D Take 1,000 Units by mouth daily.   fish oil-omega-3 fatty acids 1000 MG capsule Take 1 g by mouth daily.   hydrochlorothiazide 12.5 MG tablet Commonly known as:  HYDRODIURIL Take 12.5 mg by mouth daily.    HYDROcodone-acetaminophen 10-325 MG tablet Commonly known as:  NORCO Take 1-2 tablets by mouth every 4 (four) hours as needed (pain).   losartan 50 MG tablet Commonly known as:  COZAAR Take 50 mg by mouth daily.   methocarbamol 500 MG tablet Commonly known as:  ROBAXIN Take 1 tablet (500 mg total) by mouth every 6 (six) hours as needed for muscle spasms.   naproxen sodium 220 MG tablet Commonly known as:  ALEVE Take 440 mg by mouth 2 (two) times daily as needed.   rosuvastatin 10 MG tablet Commonly known as:  CRESTOR Take 10 mg by mouth daily.   vitamin B-12 500 MCG tablet Commonly known as:  CYANOCOBALAMIN Take 500 mcg by mouth daily.   vitamin C 500 MG tablet Commonly known as:  ASCORBIC ACID Take 500 mg by mouth daily.            Durable Medical Equipment  (From admission, onward)         Start     Ordered   12/24/17 1604  DME Walker rolling  Once    Question:  Patient needs a walker to treat with the following condition  Answer:  Degenerative spondylolisthesis   12/24/17 1603   12/24/17 1604  DME 3 n 1  Once     12/24/17 1603           Signed: Mallie Mussel A Jase Himmelberger 12/27/2017, 8:11 AM

## 2017-12-27 NOTE — Progress Notes (Signed)
Discharge instructions given. Pt verbalized understanding and all questions were answered.  

## 2017-12-27 NOTE — Discharge Instructions (Signed)

## 2017-12-27 NOTE — Care Management Note (Signed)
Case Management Note  Patient Details  Name: Belinda Brown MRN: [id number] Date of Birth: 09-02-1934  Subjective/Objective:  Patient is an 83 year old female s/p lumbar decompression and fusion of L3-4 and L4-5 on 12/24/17 after an 8 month history of progressive back pain, 2 month history of R lower extremity pain and a history of falls.  PTA, pt independent, lives at home alone.                    Action/Plan: Pt requests [facility] for home.  Son able to provide assistance at discharge.  Referral to [facility] for DME needs.    Expected Discharge Date:  12/27/17               Expected Discharge Plan:  Home/Self Care  In-House Referral:     Discharge planning Services  CM Consult  Post Acute Care Choice:    Choice offered to:     DME Arranged:  3-N-1 DME Agency:  [place]:    [facility] Agency:     Status of Service:  Completed, signed off  If discussed at [place] of Stay Meetings, dates discussed:    Additional Comments:  Reinaldo Raddle, RN, BSN  Trauma/Neuro ICU Case Manager [phone]

## 2018-01-23 DIAGNOSIS — M4310 Spondylolisthesis, site unspecified: Secondary | ICD-10-CM | POA: Diagnosis not present

## 2018-02-23 DIAGNOSIS — N3000 Acute cystitis without hematuria: Secondary | ICD-10-CM | POA: Diagnosis not present

## 2018-02-23 DIAGNOSIS — R5381 Other malaise: Secondary | ICD-10-CM | POA: Diagnosis not present

## 2018-02-28 DIAGNOSIS — M4310 Spondylolisthesis, site unspecified: Secondary | ICD-10-CM | POA: Diagnosis not present

## 2018-03-04 DIAGNOSIS — E785 Hyperlipidemia, unspecified: Secondary | ICD-10-CM | POA: Diagnosis not present

## 2018-03-04 DIAGNOSIS — I10 Essential (primary) hypertension: Secondary | ICD-10-CM | POA: Diagnosis not present

## 2018-03-04 DIAGNOSIS — E538 Deficiency of other specified B group vitamins: Secondary | ICD-10-CM | POA: Diagnosis not present

## 2018-03-04 DIAGNOSIS — E559 Vitamin D deficiency, unspecified: Secondary | ICD-10-CM | POA: Diagnosis not present

## 2018-03-12 DIAGNOSIS — Z9181 History of falling: Secondary | ICD-10-CM | POA: Diagnosis not present

## 2018-03-12 DIAGNOSIS — E785 Hyperlipidemia, unspecified: Secondary | ICD-10-CM | POA: Diagnosis not present

## 2018-03-12 DIAGNOSIS — Z1331 Encounter for screening for depression: Secondary | ICD-10-CM | POA: Diagnosis not present

## 2018-03-12 DIAGNOSIS — I10 Essential (primary) hypertension: Secondary | ICD-10-CM | POA: Diagnosis not present

## 2018-03-12 DIAGNOSIS — I2510 Atherosclerotic heart disease of native coronary artery without angina pectoris: Secondary | ICD-10-CM | POA: Diagnosis not present

## 2018-03-12 DIAGNOSIS — Z6823 Body mass index (BMI) 23.0-23.9, adult: Secondary | ICD-10-CM | POA: Diagnosis not present

## 2018-04-04 DIAGNOSIS — M4310 Spondylolisthesis, site unspecified: Secondary | ICD-10-CM | POA: Diagnosis not present

## 2018-04-17 DIAGNOSIS — R69 Illness, unspecified: Secondary | ICD-10-CM | POA: Diagnosis not present

## 2018-04-24 DIAGNOSIS — Z6823 Body mass index (BMI) 23.0-23.9, adult: Secondary | ICD-10-CM | POA: Diagnosis not present

## 2018-04-24 DIAGNOSIS — J069 Acute upper respiratory infection, unspecified: Secondary | ICD-10-CM | POA: Diagnosis not present

## 2018-05-13 DIAGNOSIS — Z6823 Body mass index (BMI) 23.0-23.9, adult: Secondary | ICD-10-CM | POA: Diagnosis not present

## 2018-05-13 DIAGNOSIS — R21 Rash and other nonspecific skin eruption: Secondary | ICD-10-CM | POA: Diagnosis not present

## 2018-06-19 DIAGNOSIS — H524 Presbyopia: Secondary | ICD-10-CM | POA: Diagnosis not present

## 2018-08-12 ENCOUNTER — Emergency Department (HOSPITAL_COMMUNITY)
Admission: EM | Admit: 2018-08-12 | Discharge: 2018-08-12 | Disposition: A | Discharge status: Home / Self Care | Payer: Medicare HMO | Attending: Emergency Medicine | Admitting: Emergency Medicine

## 2018-08-12 ENCOUNTER — Other Ambulatory Visit: Payer: Self-pay

## 2018-08-12 ENCOUNTER — Encounter (HOSPITAL_COMMUNITY): Payer: Self-pay | Admitting: Emergency Medicine

## 2018-08-12 ENCOUNTER — Emergency Department (HOSPITAL_COMMUNITY): Payer: Medicare HMO

## 2018-08-12 DIAGNOSIS — I10 Essential (primary) hypertension: Secondary | ICD-10-CM | POA: Diagnosis not present

## 2018-08-12 DIAGNOSIS — I2510 Atherosclerotic heart disease of native coronary artery without angina pectoris: Secondary | ICD-10-CM | POA: Insufficient documentation

## 2018-08-12 DIAGNOSIS — R55 Syncope and collapse: Secondary | ICD-10-CM | POA: Diagnosis not present

## 2018-08-12 DIAGNOSIS — R42 Dizziness and giddiness: Secondary | ICD-10-CM | POA: Insufficient documentation

## 2018-08-12 DIAGNOSIS — Z0389 Encounter for observation for other suspected diseases and conditions ruled out: Secondary | ICD-10-CM | POA: Diagnosis not present

## 2018-08-12 DIAGNOSIS — J929 Pleural plaque without asbestos: Secondary | ICD-10-CM | POA: Diagnosis not present

## 2018-08-12 DIAGNOSIS — Z79899 Other long term (current) drug therapy: Secondary | ICD-10-CM | POA: Diagnosis not present

## 2018-08-12 DIAGNOSIS — R0789 Other chest pain: Secondary | ICD-10-CM | POA: Diagnosis not present

## 2018-08-12 LAB — URINALYSIS, ROUTINE W REFLEX MICROSCOPIC
Bilirubin Urine: NEGATIVE
Glucose, UA: NEGATIVE mg/dL
Hgb urine dipstick: NEGATIVE
Ketones, ur: 5 mg/dL — AB
Leukocytes,Ua: NEGATIVE
Nitrite: NEGATIVE
Protein, ur: NEGATIVE mg/dL
Specific Gravity, Urine: 1.008 (ref 1.005–1.030)
pH: 6 (ref 5.0–8.0)

## 2018-08-12 LAB — CBC
HCT: 42.5 % (ref 36.0–46.0)
Hemoglobin: 14.2 g/dL (ref 12.0–15.0)
MCH: 30.9 pg (ref 26.0–34.0)
MCHC: 33.4 g/dL (ref 30.0–36.0)
MCV: 92.4 fL (ref 80.0–100.0)
Platelets: 187 10*3/uL (ref 150–400)
RBC: 4.6 MIL/uL (ref 3.87–5.11)
RDW: 13 % (ref 11.5–15.5)
WBC: 5.6 10*3/uL (ref 4.0–10.5)
nRBC: 0 % (ref 0.0–0.2)

## 2018-08-12 LAB — BASIC METABOLIC PANEL
Anion gap: 9 (ref 5–15)
BUN: 8 mg/dL (ref 8–23)
CO2: 27 mmol/L (ref 22–32)
Calcium: 9.7 mg/dL (ref 8.9–10.3)
Chloride: 104 mmol/L (ref 98–111)
Creatinine, Ser: 0.78 mg/dL (ref 0.44–1.00)
GFR calc Af Amer: >60 mL/min (ref >60)
GFR calc non Af Amer: >60 mL/min (ref >60)
Glucose, Bld: 110 mg/dL — ABNORMAL HIGH (ref 70–99)
Potassium: 4.5 mmol/L (ref 3.5–5.1)
Sodium: 140 mmol/L (ref 135–145)

## 2018-08-12 LAB — TROPONIN I (HIGH SENSITIVITY)
Troponin I (High Sensitivity): 5 ng/L (ref <18)
Troponin I (High Sensitivity): 5 ng/L (ref <18)

## 2018-08-12 MED ORDER — SODIUM CHLORIDE 0.9% FLUSH
3.0000 mL | Freq: Once | INTRAVENOUS | Status: AC
  Administered 2018-08-12: 3 mL via INTRAVENOUS

## 2018-08-12 NOTE — ED Triage Notes (Signed)
Patient sent from [facility] for further evaluation of abnormal EKG - possible 1st degree AV block. Patient states she has had increased BP for 4-5 days and increased her medication per her doctor, but still felt nauseous and lightheaded off and on. She denies chest pain, shortness of breath, fevers/chills. Resp e/u, skin w/d.

## 2018-08-12 NOTE — ED Provider Notes (Signed)
[place] EMERGENCY DEPARTMENT Provider Note   CSN: [id number] Arrival date & time: 08/12/18  1327     History   Chief Complaint Chief Complaint  Patient presents with  . Abnormal EKG    HPI Belinda Brown is a 84 y.o. female.  She is here for evaluation of elevated blood pressure and possible abnormal EKG.  She says her blood pressure is elevated for the last for 5 days and she has been noticing feeling lightheaded like she might pass out.  She checked her blood pressure and found her numbers to be like 180/90.  Her PCP talked to her over the phone and recommended that she increase her losartan and she actually took 2 tablets yesterday morning and then another tablet last night because she was not feeling well.  She still felt unwell this morning but was unable to get in with her PCP and so went to urgent care where they were concerned she had an abnormal EKG.  She denies any chest pain or shortness of breath.  She has had some chronic back issues and had surgery in November and still does not feel like she is recuperated from that.  She is also been complaining of some insomnia that she has had since November.  No recent illness no fevers or cough no chest pain no abdominal pain no urinary symptoms other than frequency.  No new numbness or weakness.     The history is provided by the patient.  Near Syncope This is a new problem. The current episode started more than 2 days ago. The problem occurs daily. The problem has not changed since onset.Pertinent negatives include no chest pain, no abdominal pain, no headaches and no shortness of breath. The symptoms are aggravated by standing. The symptoms are relieved by rest. She has tried nothing for the symptoms. The treatment provided no relief.    Past Medical History:  Diagnosis Date  . Arthritis    back & L thumb  . Hypercholesteremia   . Hypertension   . Nonobstructive atherosclerosis of coronary artery 2008   Nonobstructive disease by cath  . Thymoma 11/24/2005   s/p resection of thymic mass    Patient Active Problem List   Diagnosis Date Noted  . Lumbar stenosis with neurogenic claudication 12/24/2017  . Abdominal pain 10/23/2016  . Nonobstructive atherosclerosis of coronary artery 06/05/2016  . DOE (dyspnea on exertion) 06/02/2016  . H/O thymoma 06/02/2016  . Atypical chest pain 06/02/2016  . Fatigue 06/02/2016  . Near syncope 06/02/2016  . Thymoma   . Hypercholesteremia   . Coronary artery disease involving native coronary artery without angina pectoris   . Essential hypertension     Past Surgical History:  Procedure Laterality Date  . CARDIAC CATHETERIZATION  2008   [facility]. - Dr. Geraldo Pitter -- non-obstructive CAD (by pt report - ~50% single vessel)  . CERVICAL LAMINECTOMY  1986  . EYE SURGERY     cataracts removed- bilateral   . NM MYOVIEW LTD  06/23/2016   EF 30-35% with severe hypokinesis of the distal septal, distal inferior, mid/distal anterior, mid/distal anterolateral walls and Akinesis of the apex. The cavity size was normal. Wall thickness was normal. Trivial Aortic regurgitation. Mitral Mild MR  . partial mediastinoscopy with thymectomy and resection of thymic mass  11/24/2005   [place]  . TRANSTHORACIC ECHOCARDIOGRAM  06/19/2016   Normal LV size and function. EF 55-60%. GR 1 DD. No regional wall motion abnormalities. Mild (grade  1) DD. Mild LVH and trace AI,MR,TR  . Treadmill Stress Echo  2016   [facility]: report not avaialble.  Pt reports - Normal result  . TUBAL LIGATION       OB History   No obstetric history on file.      Home Medications    Prior to Admission medications   Medication Sig Start Date End Date Taking? Authorizing Provider  acetaminophen (TYLENOL) 650 MG CR tablet Take 650-1,300 mg by mouth every 8 (eight) hours as needed for pain.    [provider]  aspirin EC 81 MG tablet Take 81 mg by mouth daily.    [provider]  cholecalciferol (VITAMIN D) 1000 units tablet Take 1,000 Units by mouth daily.    [provider]  fish oil-omega-3 fatty acids 1000 MG capsule Take 1 g by mouth daily.     [provider]  hydrochlorothiazide (HYDRODIURIL) 12.5 MG tablet Take 12.5 mg by mouth daily. 10/02/17   [provider]  HYDROcodone-acetaminophen (NORCO) 10-325 MG tablet Take 1-2 tablets by mouth every 4 (four) hours as needed (pain). 12/27/17   Earnie Larsson, MD  losartan (COZAAR) 50 MG tablet Take 50 mg by mouth daily. 10/02/17   [provider]  methocarbamol (ROBAXIN) 500 MG tablet Take 1 tablet (500 mg total) by mouth every 6 (six) hours as needed for muscle spasms. 12/27/17   Earnie Larsson, MD  naproxen sodium (ALEVE) 220 MG tablet Take 440 mg by mouth 2 (two) times daily as needed.    [provider]  rosuvastatin (CRESTOR) 10 MG tablet Take 10 mg by mouth daily.    [provider]  vitamin B-12 (CYANOCOBALAMIN) 500 MCG tablet Take 500 mcg by mouth daily.    [provider]  vitamin C (ASCORBIC ACID) 500 MG tablet Take 500 mg by mouth daily.      [provider]    Family History Family History  Problem Relation Age of Onset  . Congestive Heart Failure Mother   . Heart attack Father   . Cancer Sister   . COPD Brother   . Heart failure Maternal Grandmother   . Pneumonia Maternal Grandfather   . Heart failure Paternal Grandmother   . Heart failure Paternal Grandfather     Social History Social History   Tobacco Use  . Smoking status: Never Smoker  . Smokeless tobacco: Never Used  Substance Use Topics  . Alcohol use: Yes    Comment: not on a regular basis  . Drug use: No     Allergies   Ampicillin and Codeine   Review of Systems Review of Systems  Constitutional: Negative for fever.  HENT: Negative for sore throat.   Eyes: Negative for visual disturbance.  Respiratory: Negative for shortness of breath.    Cardiovascular: Positive for near-syncope. Negative for chest pain.  Gastrointestinal: Negative for abdominal pain.  Genitourinary: Negative for dysuria.  Musculoskeletal: Positive for back pain (chronic). Negative for neck pain.  Skin: Negative for rash.  Neurological: Positive for light-headedness. Negative for headaches.     Physical Exam Updated Vital Signs BP (!) 188/91 (BP Location: Left Arm)   Pulse 70   Temp 98.7 F (37.1 C) (Oral)   Resp 16   LMP  (LMP Unknown)   SpO2 97%   Physical Exam Vitals signs and nursing note reviewed.  Constitutional:      General: She is not in acute distress.    Appearance: She is well-developed.  HENT:     Head: Normocephalic and atraumatic.  Eyes:     Conjunctiva/sclera: Conjunctivae normal.  Neck:     Musculoskeletal: Neck supple.  Cardiovascular:     Rate and Rhythm: Normal rate and regular rhythm.     Heart sounds: No murmur.  Pulmonary:     Effort: Pulmonary effort is normal. No respiratory distress.     Breath sounds: Normal breath sounds.  Abdominal:     Palpations: Abdomen is soft.     Tenderness: There is no abdominal tenderness.  Musculoskeletal: Normal range of motion.     Right lower leg: No edema.     Left lower leg: No edema.  Skin:    General: Skin is warm and dry.     Capillary Refill: Capillary refill takes less than 2 seconds.  Neurological:     General: No focal deficit present.     Mental Status: She is alert and oriented to person, place, and time.     Sensory: No sensory deficit.     Motor: No weakness.     Gait: Gait normal.      ED Treatments / Results  Labs (all labs ordered are listed, but only abnormal results are displayed) Labs Reviewed  BASIC METABOLIC PANEL - Abnormal; Notable for the following components:      Result Value   Glucose, Bld 110 (*)    All other components within normal limits  URINALYSIS, ROUTINE W REFLEX MICROSCOPIC - Abnormal; Notable for the following components:    Color, Urine STRAW (*)    Ketones, ur 5 (*)    All other components within normal limits  CBC  TROPONIN I (HIGH SENSITIVITY)  TROPONIN I (HIGH SENSITIVITY)    EKG EKG Interpretation  Date/Time:  Monday August 12 2018 13:38:46 EDT Ventricular Rate:  71 PR Interval:  194 QRS Duration: 82 QT Interval:  414 QTC Calculation: 449 R Axis:   -55 Text Interpretation:  Normal sinus rhythm Pulmonary disease pattern Left anterior fascicular block Abnormal ECG no significan change from prior 11/19 Confirmed by Aletta Edouard [phone]) on 08/12/2018 4:43:37 PM   Radiology Dg Chest 2 View  Result Date: 08/12/2018 CLINICAL DATA:  Abnormal EKG.  Hypertension.  Chest tightness. EXAM: CHEST - 2 VIEW COMPARISON:  CT chest 02/09/2015 and chest radiograph 03/10/2014. FINDINGS: Trachea is midline. Heart size stable. Left apical pleural thickening. Lungs are hyperinflated but clear. No pleural fluid. IMPRESSION: Hyperinflation without acute finding. Electronically Signed   By: Lorin Picket M.D.   On: 08/12/2018 14:11   Ct Head Wo Contrast  Result Date: 08/12/2018 CLINICAL DATA:  Cerebral hemorrhage suspected. EXAM: CT HEAD WITHOUT CONTRAST TECHNIQUE: Contiguous axial images were obtained from the base of the skull through the vertex without intravenous contrast. COMPARISON:  None. FINDINGS: Brain: Negative for cerebral hemorrhage. Negative for acute infarct or mass. Ventricle size normal. Vascular: Negative for hyperdense vessel Skull: Negative Sinuses/Orbits: Chronic mucoperiosteal thickening left sphenoid sinus. Bilateral cataract surgery. Other: None IMPRESSION: Negative CT brain.  Negative for intracranial hemorrhage Chronic mucoperiosteal thickening left sphenoid sinus. Electronically Signed   By: Franchot Gallo M.D.   On: 08/12/2018 17:40    Procedures Procedures (including critical care time)  Medications Ordered in ED Medications  sodium chloride flush (NS) 0.9 % injection 3 mL (has no  administration in time range)     Initial Impression / Assessment and Plan / ED Course  I have reviewed the triage vital signs and the nursing notes.  Pertinent labs &  imaging results that were available during my care of the patient were reviewed by me and considered in my medical decision making (see chart for details).  Clinical Course as of Aug 12 901  Mon Aug 12, 2018  1828 Delta troponin unchanged.  Head CT unremarkable.  I do not have an obvious explanation for why his blood pressure is elevated but do not feel she needs an admission.  Urinalysis just collected now we will follow-up for that.   [MB]  1922 I updated the patient on her lab work and the results of her imaging.  Her blood pressure still remains high here but not critical and no evidence of any endorgan damage.  Minna refer her back to her primary care doctor so she can get this further looked into.   [MB]    Clinical Course User Index [MB] Hayden Rasmussen, MD         Final Clinical Impressions(s) / ED Diagnoses   Final diagnoses:  Hypertension, unspecified type  Lightheadedness    ED Discharge Orders    None       Hayden Rasmussen, MD 08/13/18 [phone]

## 2018-08-12 NOTE — Discharge Instructions (Addendum)
You were seen in the emergency department for elevated blood pressures and feeling lightheaded.  You had blood work EKG chest x-ray and a CAT scan that did not show any significant findings.  It will be important for you to follow-up with your primary care doctor for further blood pressure management and consideration for cardiology referral.  Please return if any worsening symptoms

## 2018-08-13 DIAGNOSIS — I10 Essential (primary) hypertension: Secondary | ICD-10-CM | POA: Diagnosis not present

## 2018-08-13 DIAGNOSIS — Z6823 Body mass index (BMI) 23.0-23.9, adult: Secondary | ICD-10-CM | POA: Diagnosis not present

## 2018-08-27 DIAGNOSIS — R351 Nocturia: Secondary | ICD-10-CM | POA: Diagnosis not present

## 2018-08-27 DIAGNOSIS — Z6823 Body mass index (BMI) 23.0-23.9, adult: Secondary | ICD-10-CM | POA: Diagnosis not present

## 2018-08-27 DIAGNOSIS — M545 Low back pain: Secondary | ICD-10-CM | POA: Diagnosis not present

## 2018-08-27 DIAGNOSIS — I10 Essential (primary) hypertension: Secondary | ICD-10-CM | POA: Diagnosis not present

## 2018-08-27 DIAGNOSIS — N3281 Overactive bladder: Secondary | ICD-10-CM | POA: Diagnosis not present

## 2018-08-27 DIAGNOSIS — R5383 Other fatigue: Secondary | ICD-10-CM | POA: Diagnosis not present

## 2018-09-10 DIAGNOSIS — E785 Hyperlipidemia, unspecified: Secondary | ICD-10-CM | POA: Diagnosis not present

## 2018-09-10 DIAGNOSIS — I10 Essential (primary) hypertension: Secondary | ICD-10-CM | POA: Diagnosis not present

## 2018-09-18 DIAGNOSIS — R351 Nocturia: Secondary | ICD-10-CM | POA: Diagnosis not present

## 2018-09-18 DIAGNOSIS — E785 Hyperlipidemia, unspecified: Secondary | ICD-10-CM | POA: Diagnosis not present

## 2018-09-18 DIAGNOSIS — Z6823 Body mass index (BMI) 23.0-23.9, adult: Secondary | ICD-10-CM | POA: Diagnosis not present

## 2018-09-18 DIAGNOSIS — I2510 Atherosclerotic heart disease of native coronary artery without angina pectoris: Secondary | ICD-10-CM | POA: Diagnosis not present

## 2018-09-18 DIAGNOSIS — N3281 Overactive bladder: Secondary | ICD-10-CM | POA: Diagnosis not present

## 2018-09-18 DIAGNOSIS — I10 Essential (primary) hypertension: Secondary | ICD-10-CM | POA: Diagnosis not present

## 2018-09-18 DIAGNOSIS — M545 Low back pain: Secondary | ICD-10-CM | POA: Diagnosis not present

## 2018-10-07 DIAGNOSIS — D1801 Hemangioma of skin and subcutaneous tissue: Secondary | ICD-10-CM | POA: Diagnosis not present

## 2018-10-07 DIAGNOSIS — L820 Inflamed seborrheic keratosis: Secondary | ICD-10-CM | POA: Diagnosis not present

## 2018-10-07 DIAGNOSIS — D225 Melanocytic nevi of trunk: Secondary | ICD-10-CM | POA: Diagnosis not present

## 2018-10-07 DIAGNOSIS — L578 Other skin changes due to chronic exposure to nonionizing radiation: Secondary | ICD-10-CM | POA: Diagnosis not present

## 2018-10-08 DIAGNOSIS — G4733 Obstructive sleep apnea (adult) (pediatric): Secondary | ICD-10-CM

## 2018-10-08 DIAGNOSIS — R0602 Shortness of breath: Secondary | ICD-10-CM | POA: Diagnosis not present

## 2018-10-08 HISTORY — DX: Obstructive sleep apnea (adult) (pediatric): G47.33

## 2018-10-09 DIAGNOSIS — G4733 Obstructive sleep apnea (adult) (pediatric): Secondary | ICD-10-CM | POA: Diagnosis not present

## 2018-10-09 DIAGNOSIS — R0602 Shortness of breath: Secondary | ICD-10-CM | POA: Diagnosis not present

## 2018-10-15 DIAGNOSIS — R69 Illness, unspecified: Secondary | ICD-10-CM | POA: Diagnosis not present

## 2018-11-08 DIAGNOSIS — G4733 Obstructive sleep apnea (adult) (pediatric): Secondary | ICD-10-CM | POA: Diagnosis not present

## 2018-12-08 DIAGNOSIS — G4733 Obstructive sleep apnea (adult) (pediatric): Secondary | ICD-10-CM | POA: Diagnosis not present

## 2019-01-08 DIAGNOSIS — G4733 Obstructive sleep apnea (adult) (pediatric): Secondary | ICD-10-CM | POA: Diagnosis not present

## 2019-01-13 IMAGING — CT CT ABD-PELV W/ CM
2 of 5 series · 15 of 46 positions shown, 17 images · IV contrast (ISOVUE 300)
Comparison: Overlapping portions of CT chest from 02/09/2015.

CLINICAL DATA: Adnexal mass. Elevated CA 125 level. Recurrent
urinary tract infections. Mid abdominal pain and pelvic pressure for
6 months. History of thymoma.

EXAM:
CT ABDOMEN AND PELVIS WITH CONTRAST
TECHNIQUE: Multidetector CT imaging of the abdomen and pelvis was performed
using the standard protocol following bolus administration of
intravenous contrast.
CONTRAST:  100 cc Isovue 300

[Series 2: axial st · axial · 0.74mm/px · z∈[+1127,+1487]mm · 12 of 84 slices shown, 14 images]
[im 6/84  soft-tissue]
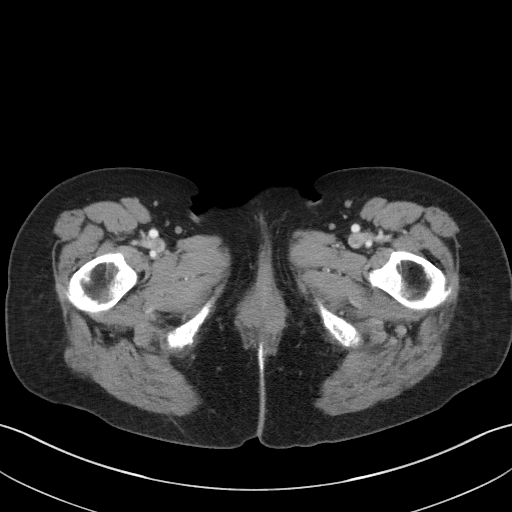
[im 6/84  bone]
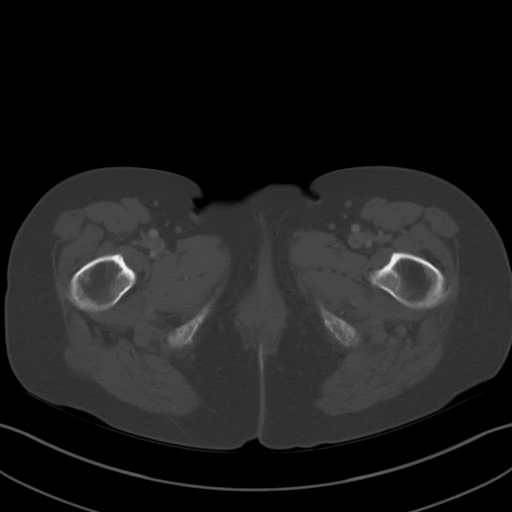
[im 11/84  soft-tissue]
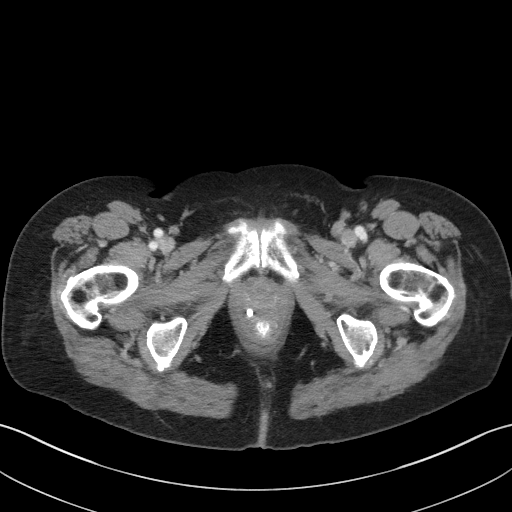
[im 21/84  soft-tissue]
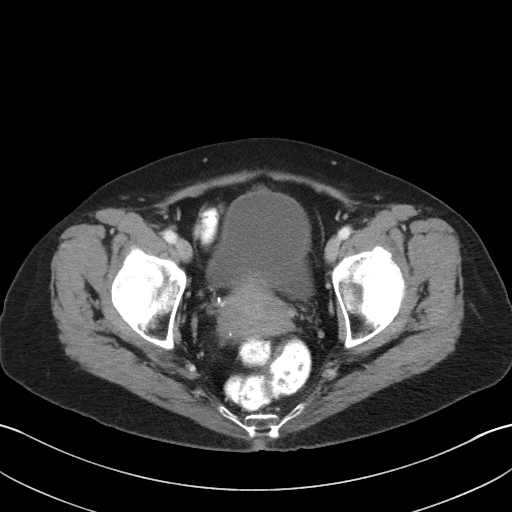
[im 26/84  soft-tissue]
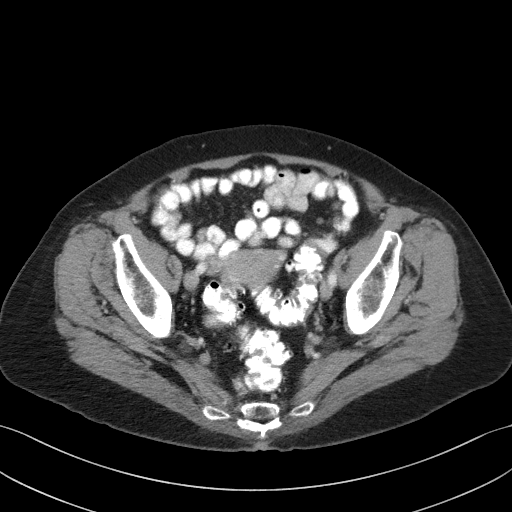
[im 32/84  soft-tissue]
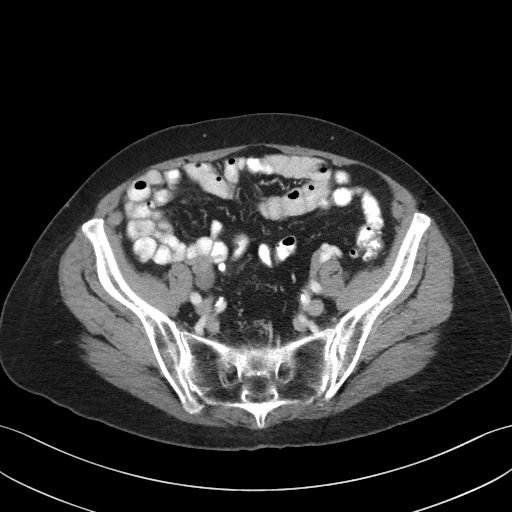
[im 37/84  soft-tissue]
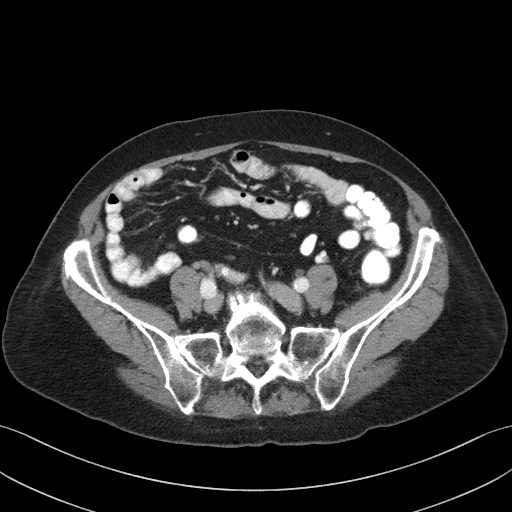
[im 47/84  soft-tissue]
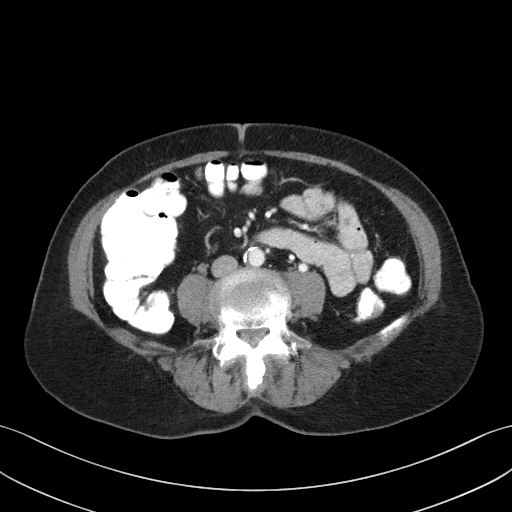
[im 52/84  soft-tissue]
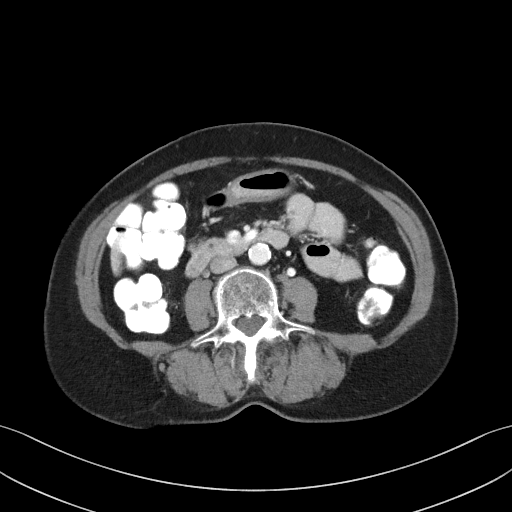
[im 58/84  soft-tissue]
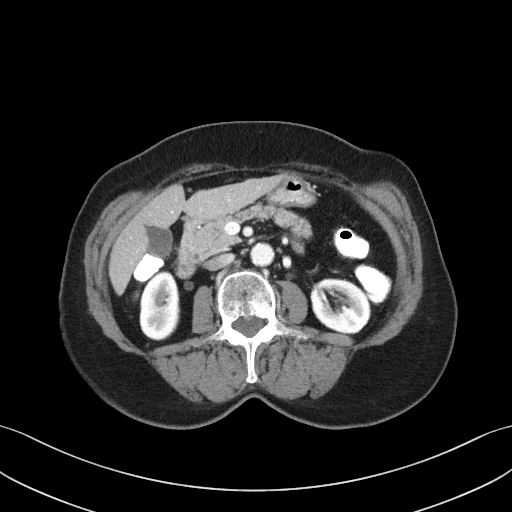
[im 58/84  bone]
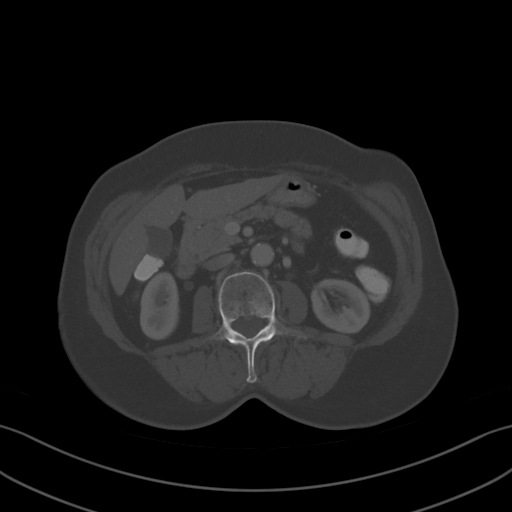
[im 63/84  soft-tissue]
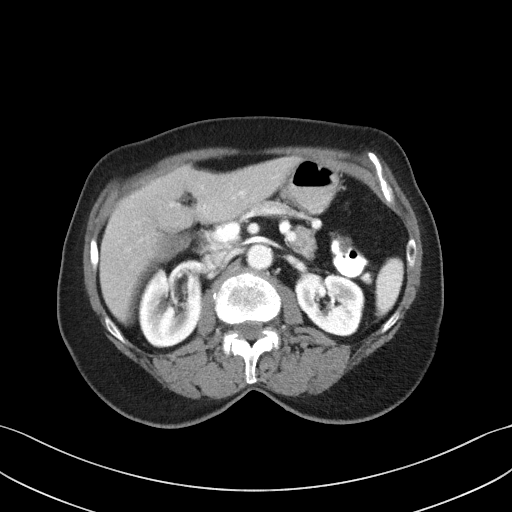
[im 73/84  soft-tissue]
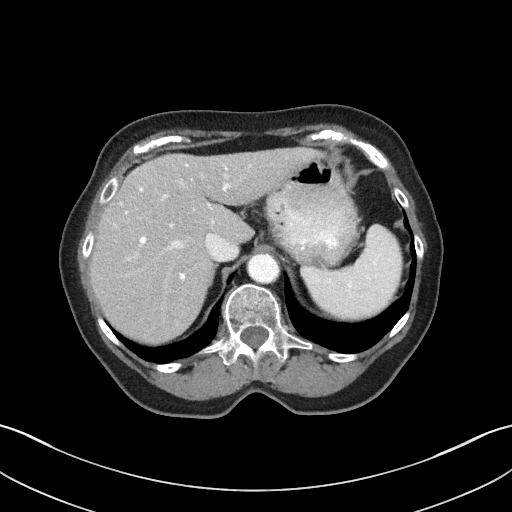
[im 78/84  soft-tissue]
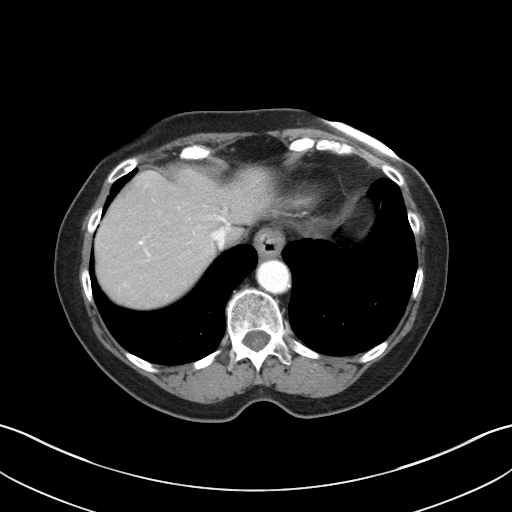

[Series 5: coronal st · coronal · 0.64mm/px · 3 of 88 slices shown]
[im 30/88  soft-tissue]
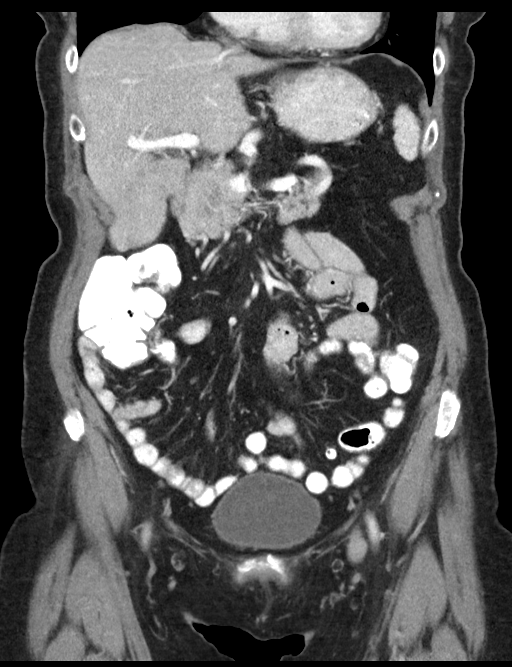
[im 39/88  soft-tissue]
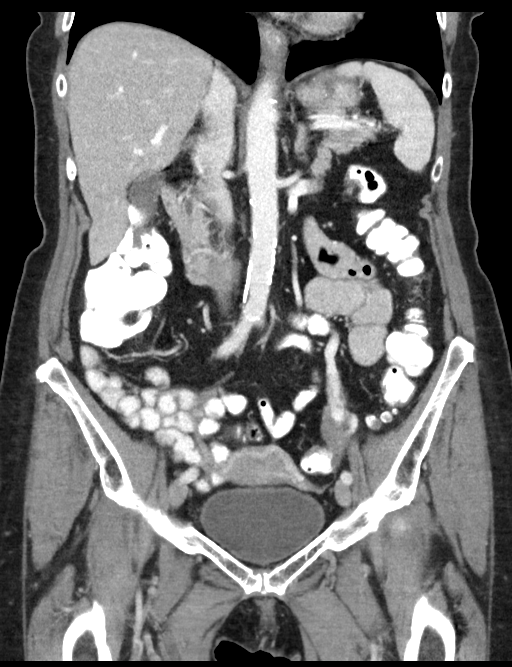
[im 49/88  soft-tissue]
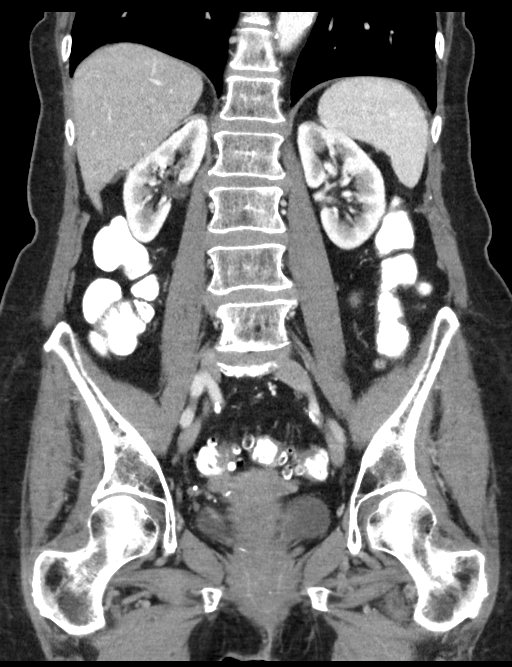

[15 of 46 positions shown; findings below may reference images not displayed]

FINDINGS: Lower chest: 5 by 4 mm right lower lobe nodule anteriorly on image
[DATE], no change compare to 10/13/2005

5 by 4 mm right lower lobe nodule on image [DATE], likewise unchanged.

Small amount of contrast in the distal esophagus.

Hepatobiliary: 6 mm hypodense lesion inferiorly in the right hepatic
lobe on image [DATE] is statistically likely to be benign but
technically nonspecific.

Gallbladder unremarkable.  No biliary dilatation.

Pancreas: Unremarkable

Spleen: Unremarkable

Adrenals/Urinary Tract: 6 mm hypodense lesion of the left kidney
lower pole is likely a cyst although technically too small to
characterize. Otherwise unremarkable.

Stomach/Bowel: Sigmoid colon diverticulosis

Vascular/Lymphatic: Aortoiliac atherosclerotic vascular disease.

Reproductive: The uterus appears unremarkable. The left ovary
measures 2.1 by 1.2 by 2.5 cm and appears normal. The right ovary
measures approximately 2.5 by 1.5 by 2.3 cm although along its
medial margin there is a suggestion of an adjacent lower density
by 1.1 by 1.0 cm hypodense lesion which could be a cyst.

Other: No supplemental non-categorized findings.

Musculoskeletal: Grade 1 anterolisthesis at L4-5 with suspected
foraminal impingement and central narrowing of the thecal sac at
this level. There is also degenerative disc disease at L2-3 and
L3-4. Baastrup's disease is suspected at L4-5. Slightly transitional
S1 vertebra.
IMPRESSION: 1. Potential small cystic lesion along the right ovary. Given the
elevated CA 125 level and concern for adnexal mass, dedicated pelvic
sonography would be recommended for definitive characterization.
Otherwise the ovaries appear normal.
2. Several right lower lobe pulmonary nodules are unchanged from
5008 and considered benign.
3. 6 mm hypodense lesion inferiorly in the right hepatic lobe is
technically nonspecific. Although statistically likely to be benign,
the patient has abnormal liver enzymes, history of gastrointestinal
malignancy, or if otherwise clinically warranted, hepatic protocol
MRI with and without contrast could be utilized for further
characterization.
4. Small non of contrast in the distal esophagus, potentially from
dysmotility or reflux.
5. Sigmoid colon diverticulosis.
6.  Aortic Atherosclerosis ([id number]).
7. Lumbar spondylosis and degenerative disc disease potentially
causing impingement at L4-5. Baastrup's disease at L4-5.

## 2019-02-07 DIAGNOSIS — G4733 Obstructive sleep apnea (adult) (pediatric): Secondary | ICD-10-CM | POA: Diagnosis not present

## 2019-03-06 DIAGNOSIS — Z0000 Encounter for general adult medical examination without abnormal findings: Secondary | ICD-10-CM | POA: Diagnosis not present

## 2019-03-06 DIAGNOSIS — Z1331 Encounter for screening for depression: Secondary | ICD-10-CM | POA: Diagnosis not present

## 2019-03-06 DIAGNOSIS — E785 Hyperlipidemia, unspecified: Secondary | ICD-10-CM | POA: Diagnosis not present

## 2019-03-06 DIAGNOSIS — Z9181 History of falling: Secondary | ICD-10-CM | POA: Diagnosis not present

## 2019-03-06 DIAGNOSIS — Z139 Encounter for screening, unspecified: Secondary | ICD-10-CM | POA: Diagnosis not present

## 2019-03-10 DIAGNOSIS — G4733 Obstructive sleep apnea (adult) (pediatric): Secondary | ICD-10-CM | POA: Diagnosis not present

## 2019-03-24 DIAGNOSIS — E785 Hyperlipidemia, unspecified: Secondary | ICD-10-CM | POA: Diagnosis not present

## 2019-03-24 DIAGNOSIS — I10 Essential (primary) hypertension: Secondary | ICD-10-CM | POA: Diagnosis not present

## 2019-03-26 DIAGNOSIS — Z6823 Body mass index (BMI) 23.0-23.9, adult: Secondary | ICD-10-CM | POA: Diagnosis not present

## 2019-03-26 DIAGNOSIS — E785 Hyperlipidemia, unspecified: Secondary | ICD-10-CM | POA: Diagnosis not present

## 2019-03-26 DIAGNOSIS — I2510 Atherosclerotic heart disease of native coronary artery without angina pectoris: Secondary | ICD-10-CM | POA: Diagnosis not present

## 2019-03-26 DIAGNOSIS — N3281 Overactive bladder: Secondary | ICD-10-CM | POA: Diagnosis not present

## 2019-03-26 DIAGNOSIS — G4733 Obstructive sleep apnea (adult) (pediatric): Secondary | ICD-10-CM | POA: Diagnosis not present

## 2019-03-26 DIAGNOSIS — M545 Low back pain: Secondary | ICD-10-CM | POA: Diagnosis not present

## 2019-03-26 DIAGNOSIS — I10 Essential (primary) hypertension: Secondary | ICD-10-CM | POA: Diagnosis not present

## 2019-03-26 DIAGNOSIS — R7303 Prediabetes: Secondary | ICD-10-CM | POA: Diagnosis not present

## 2019-04-10 DIAGNOSIS — G4733 Obstructive sleep apnea (adult) (pediatric): Secondary | ICD-10-CM | POA: Diagnosis not present

## 2019-04-14 DIAGNOSIS — R69 Illness, unspecified: Secondary | ICD-10-CM | POA: Diagnosis not present

## 2019-05-08 DIAGNOSIS — G4733 Obstructive sleep apnea (adult) (pediatric): Secondary | ICD-10-CM | POA: Diagnosis not present

## 2019-06-08 DIAGNOSIS — G4733 Obstructive sleep apnea (adult) (pediatric): Secondary | ICD-10-CM | POA: Diagnosis not present

## 2019-06-30 DIAGNOSIS — E538 Deficiency of other specified B group vitamins: Secondary | ICD-10-CM | POA: Diagnosis not present

## 2019-06-30 DIAGNOSIS — I2510 Atherosclerotic heart disease of native coronary artery without angina pectoris: Secondary | ICD-10-CM | POA: Diagnosis not present

## 2019-06-30 DIAGNOSIS — R5383 Other fatigue: Secondary | ICD-10-CM | POA: Diagnosis not present

## 2019-06-30 DIAGNOSIS — G4733 Obstructive sleep apnea (adult) (pediatric): Secondary | ICD-10-CM | POA: Diagnosis not present

## 2019-07-08 DIAGNOSIS — G4733 Obstructive sleep apnea (adult) (pediatric): Secondary | ICD-10-CM | POA: Diagnosis not present

## 2019-08-08 DIAGNOSIS — G4733 Obstructive sleep apnea (adult) (pediatric): Secondary | ICD-10-CM | POA: Diagnosis not present

## 2019-09-07 DIAGNOSIS — G4733 Obstructive sleep apnea (adult) (pediatric): Secondary | ICD-10-CM | POA: Diagnosis not present

## 2019-09-23 DIAGNOSIS — E785 Hyperlipidemia, unspecified: Secondary | ICD-10-CM | POA: Diagnosis not present

## 2019-09-23 DIAGNOSIS — I10 Essential (primary) hypertension: Secondary | ICD-10-CM | POA: Diagnosis not present

## 2019-09-23 DIAGNOSIS — E559 Vitamin D deficiency, unspecified: Secondary | ICD-10-CM | POA: Diagnosis not present

## 2019-09-25 DIAGNOSIS — M545 Low back pain: Secondary | ICD-10-CM | POA: Diagnosis not present

## 2019-09-25 DIAGNOSIS — I10 Essential (primary) hypertension: Secondary | ICD-10-CM | POA: Diagnosis not present

## 2019-09-25 DIAGNOSIS — I2510 Atherosclerotic heart disease of native coronary artery without angina pectoris: Secondary | ICD-10-CM | POA: Diagnosis not present

## 2019-09-25 DIAGNOSIS — R7303 Prediabetes: Secondary | ICD-10-CM | POA: Diagnosis not present

## 2019-10-01 DIAGNOSIS — G4733 Obstructive sleep apnea (adult) (pediatric): Secondary | ICD-10-CM | POA: Diagnosis not present

## 2019-10-08 DIAGNOSIS — D225 Melanocytic nevi of trunk: Secondary | ICD-10-CM | POA: Diagnosis not present

## 2019-10-08 DIAGNOSIS — D1801 Hemangioma of skin and subcutaneous tissue: Secondary | ICD-10-CM | POA: Diagnosis not present

## 2019-10-08 DIAGNOSIS — L578 Other skin changes due to chronic exposure to nonionizing radiation: Secondary | ICD-10-CM | POA: Diagnosis not present

## 2019-10-08 DIAGNOSIS — L821 Other seborrheic keratosis: Secondary | ICD-10-CM | POA: Diagnosis not present

## 2019-10-08 DIAGNOSIS — L570 Actinic keratosis: Secondary | ICD-10-CM | POA: Diagnosis not present

## 2019-10-08 DIAGNOSIS — G4733 Obstructive sleep apnea (adult) (pediatric): Secondary | ICD-10-CM | POA: Diagnosis not present

## 2019-10-13 DIAGNOSIS — R69 Illness, unspecified: Secondary | ICD-10-CM | POA: Diagnosis not present

## 2019-11-04 DIAGNOSIS — H524 Presbyopia: Secondary | ICD-10-CM | POA: Diagnosis not present

## 2019-11-04 DIAGNOSIS — Z0100 Encounter for examination of eyes and vision without abnormal findings: Secondary | ICD-10-CM | POA: Diagnosis not present

## 2020-03-04 DIAGNOSIS — M545 Low back pain, unspecified: Secondary | ICD-10-CM | POA: Diagnosis not present

## 2020-03-04 DIAGNOSIS — M5416 Radiculopathy, lumbar region: Secondary | ICD-10-CM | POA: Diagnosis not present

## 2020-03-08 ENCOUNTER — Other Ambulatory Visit: Payer: Self-pay | Admitting: Student

## 2020-03-08 DIAGNOSIS — M5416 Radiculopathy, lumbar region: Secondary | ICD-10-CM

## 2020-03-27 ENCOUNTER — Ambulatory Visit
Admission: RE | Admit: 2020-03-27 | Discharge: 2020-03-27 | Disposition: A | Discharge status: Home / Self Care | Payer: Medicare HMO | Source: Ambulatory Visit | Attending: Student | Admitting: Student

## 2020-03-27 ENCOUNTER — Other Ambulatory Visit: Payer: Self-pay

## 2020-03-27 DIAGNOSIS — M4326 Fusion of spine, lumbar region: Secondary | ICD-10-CM | POA: Diagnosis not present

## 2020-03-27 DIAGNOSIS — M5416 Radiculopathy, lumbar region: Secondary | ICD-10-CM

## 2020-03-27 DIAGNOSIS — M48061 Spinal stenosis, lumbar region without neurogenic claudication: Secondary | ICD-10-CM | POA: Diagnosis not present

## 2020-03-27 MED ORDER — GADOBENATE DIMEGLUMINE 529 MG/ML IV SOLN
12.0000 mL | Freq: Once | INTRAVENOUS | Status: AC | PRN
  Administered 2020-03-27: 12 mL via INTRAVENOUS

## 2020-03-30 DIAGNOSIS — M5416 Radiculopathy, lumbar region: Secondary | ICD-10-CM | POA: Diagnosis not present

## 2020-03-30 DIAGNOSIS — E785 Hyperlipidemia, unspecified: Secondary | ICD-10-CM | POA: Diagnosis not present

## 2020-03-30 DIAGNOSIS — I10 Essential (primary) hypertension: Secondary | ICD-10-CM | POA: Diagnosis not present

## 2020-04-06 DIAGNOSIS — R7303 Prediabetes: Secondary | ICD-10-CM | POA: Diagnosis not present

## 2020-04-06 DIAGNOSIS — Z9181 History of falling: Secondary | ICD-10-CM | POA: Diagnosis not present

## 2020-04-06 DIAGNOSIS — M545 Low back pain, unspecified: Secondary | ICD-10-CM | POA: Diagnosis not present

## 2020-04-06 DIAGNOSIS — N390 Urinary tract infection, site not specified: Secondary | ICD-10-CM | POA: Diagnosis not present

## 2020-04-06 DIAGNOSIS — I2510 Atherosclerotic heart disease of native coronary artery without angina pectoris: Secondary | ICD-10-CM | POA: Diagnosis not present

## 2020-04-06 DIAGNOSIS — E785 Hyperlipidemia, unspecified: Secondary | ICD-10-CM | POA: Diagnosis not present

## 2020-04-06 DIAGNOSIS — Z139 Encounter for screening, unspecified: Secondary | ICD-10-CM | POA: Diagnosis not present

## 2020-04-06 DIAGNOSIS — I10 Essential (primary) hypertension: Secondary | ICD-10-CM | POA: Diagnosis not present

## 2020-04-06 DIAGNOSIS — Z1331 Encounter for screening for depression: Secondary | ICD-10-CM | POA: Diagnosis not present

## 2020-04-06 DIAGNOSIS — N3281 Overactive bladder: Secondary | ICD-10-CM | POA: Diagnosis not present

## 2020-04-08 DIAGNOSIS — I10 Essential (primary) hypertension: Secondary | ICD-10-CM | POA: Diagnosis not present

## 2020-04-08 DIAGNOSIS — M5416 Radiculopathy, lumbar region: Secondary | ICD-10-CM | POA: Diagnosis not present

## 2020-04-08 DIAGNOSIS — M545 Low back pain, unspecified: Secondary | ICD-10-CM | POA: Diagnosis not present

## 2020-04-08 DIAGNOSIS — M5126 Other intervertebral disc displacement, lumbar region: Secondary | ICD-10-CM | POA: Diagnosis not present

## 2020-04-08 DIAGNOSIS — M4310 Spondylolisthesis, site unspecified: Secondary | ICD-10-CM | POA: Diagnosis not present

## 2020-04-08 DIAGNOSIS — M48062 Spinal stenosis, lumbar region with neurogenic claudication: Secondary | ICD-10-CM | POA: Diagnosis not present

## 2020-04-14 DIAGNOSIS — M48062 Spinal stenosis, lumbar region with neurogenic claudication: Secondary | ICD-10-CM | POA: Diagnosis not present

## 2020-04-20 DIAGNOSIS — N390 Urinary tract infection, site not specified: Secondary | ICD-10-CM | POA: Diagnosis not present

## 2020-05-17 ENCOUNTER — Encounter: Payer: Self-pay | Admitting: Cardiology

## 2020-05-17 ENCOUNTER — Other Ambulatory Visit: Payer: Self-pay

## 2020-05-17 ENCOUNTER — Ambulatory Visit: Payer: Medicare HMO | Admitting: Cardiology

## 2020-05-17 VITALS — BP 118/70 | HR 75 | Ht 65.0 in | Wt 139.0 lb

## 2020-05-17 DIAGNOSIS — E7800 Pure hypercholesterolemia, unspecified: Secondary | ICD-10-CM

## 2020-05-17 DIAGNOSIS — R0600 Dyspnea, unspecified: Secondary | ICD-10-CM

## 2020-05-17 DIAGNOSIS — R5382 Chronic fatigue, unspecified: Secondary | ICD-10-CM

## 2020-05-17 DIAGNOSIS — I10 Essential (primary) hypertension: Secondary | ICD-10-CM

## 2020-05-17 DIAGNOSIS — R0789 Other chest pain: Secondary | ICD-10-CM

## 2020-05-17 DIAGNOSIS — R55 Syncope and collapse: Secondary | ICD-10-CM | POA: Diagnosis not present

## 2020-05-17 DIAGNOSIS — I2510 Atherosclerotic heart disease of native coronary artery without angina pectoris: Secondary | ICD-10-CM

## 2020-05-17 DIAGNOSIS — R0609 Other forms of dyspnea: Secondary | ICD-10-CM

## 2020-05-17 NOTE — Patient Instructions (Addendum)
Medication Instructions:  Not needed *If you need a refill on your cardiac medications before your next appointment, please call your pharmacy*   Lab Work: Not needed   you may possible may need You will need a COVID-19  test  3 days prior to your procedure. Self isolate between test and procedure. This is a Drive Up Visit at [address] [place], [place] [address]. Someone will direct you to the appropriate testing line. Stay in your car and someone will be with you shortly.       Testing/Procedures: Will be schedule for [place] physician has recommended that you have a cardiopulmonary stress test (CPX). CPX testing is a non-invasive measurement of heart and lung function. It replaces a traditional treadmill stress test. This type of test provides a tremendous amount of information that relates not only to your present condition but also for future outcomes. This test combines measurements of you ventilation, respiratory gas exchange in the lungs, electrocardiogram (EKG), blood pressure and physical response before, during, and following an exercise protocol.    Follow-Up: At [facility], you and your health needs are our priority.  As part of our continuing mission to provide you with exceptional heart care, we have created designated Provider Care Teams.  These Care Teams include your primary Cardiologist (physician) and Advanced Practice Providers (APPs -  Physician Assistants and Nurse Practitioners) who all work together to provide you with the care you need, when you need it.  We recommend signing up for the patient portal called "MyChart".  Sign up information is provided on this After Visit Summary.  MyChart is used to connect with patients for Virtual Visits (Telemedicine).  Patients are able to view lab/test results, encounter notes, upcoming appointments, etc.  Non-urgent messages can be sent to your provider as well.   To learn more about what you can do with  MyChart, go to [email or web address].    Your next appointment:   2 month(s)  The format for your next appointment:   In Person  Provider:   Glenetta Hew, MD   Other Instructions   Cardiopulmonary Exercise Stress Test Cardiopulmonary exercise testing (CPET) is a test that checks how the heart and lungs react to exercise. This is called "exercise capacity." During this test, you will walk or run on a treadmill or pedal on a stationary bike. As you walk or run, tests will be done on your heart and lungs. You may have this test to:  Find out why you are short of breath.  Check for exercise intolerance.  See how your lungs work.  See how your heart works.  Check whether your heart or lungs are responding to treatments.  Check if you have a heart or lung problem.  Check if you are healthy enough to have surgery. Tell your doctor about:  Any allergies you have.  All medicines you are taking.  Any problems you or family members have had with anesthetic medicines.  Any blood disorders you have.  Any surgeries you have had.  Any medical conditions you have.  Whether you are pregnant or may be pregnant. What are the risks? Generally, this is a safe test. However, problems may occur, including:  Chest pain.  Shortness of breath.  Leg pain.  Irregular heartbeat. What happens before the procedure?  Follow instructions from your doctor about what you cannot eat or drink.  Ask your doctor about changing or stopping your normal medicines.  Wear loose, comfortable clothing and  shoes.  If you use an inhaler, bring it with you to the test. What happens during the procedure?  A blood pressure cuff will be placed on your arm.  Stick-on patches (electrodes) will be placed on your chest. They will be attached to an EKG machine.  A clip-on monitor that shows the amount of oxygen in your blood will be placed on your finger (pulse oximeter).  A clip will be placed on  your nose and a mouthpiece will be placed in your mouth. This may be held in place with a headpiece. You will breathe through the mouthpiece during the test.  You will be asked to start exercising. You will be closely watched while you exercise.  The amount of effort for your exercise will be slowly increased.  During exercise, the test will measure: ? Your heart rate. ? Your heart rhythm. ? Your oxygen blood level. ? The amount of oxygen and carbon dioxide that you breathe out.  The test will end when: ? You have finished the test. ? You have reached your maximum ability to exercise. ? You have chest or leg pain, dizziness, or shortness of breath. The test may vary among doctors and hospitals.   What can I expect after the test? Your blood pressure, heart rate, breathing rate, and blood oxygen level will be monitored until you leave the hospital or clinic. Summary  Cardiopulmonary exercise testing (CPET) is a test that checks how your heart and lungs react to exercise.  Follow your doctor's instructions about food and drink, and what medicines to change or stop.  During this test, you will walk or run on a treadmill or pedal on a stationary bike. Tests will be done as you run or walk. This information is not intended to replace advice given to you by your health care provider. Make sure you discuss any questions you have with your health care provider. Document Revised: 04/18/2018 Document Reviewed: 10/04/2017 Elsevier Patient Education  2021 [employer].

## 2020-05-17 NOTE — Progress Notes (Addendum)
Primary Care Provider: Cyndi Bender, PA-C Cardiologist: Glenetta Hew, MD Electrophysiologist: None  Clinic Note: Chief Complaint  Patient presents with  . New Patient (Initial Visit)  . Shortness of Breath    On exertion.    ===================================  ASSESSMENT/PLAN   Problem List Items Addressed This Visit    Fatigue    This is a chronic problem.  Question if it has anything do with thymoma versus not using CPAP.  Because of his exertional dyspnea associated as well, we will check CPX.      Near syncope    No further episodes.      Essential hypertension (Chronic)    Atenolol stopped because of concern for involvement in dizziness.  She was then started back on losartan along with HCTZ  Plan was to allow for some mild permissive hypertension.  Current blood pressure recordings are stable.  -> Would recommend using HCTZ only as a as needed since it may adversely affect her overactive bladder.      Relevant Orders   EKG 12-Lead (Completed)   Hypercholesteremia (Chronic)    LDL was 133.  Given her advanced age, would be reluctant to actually start treatment now unless there is a reason to do so.  She has had nonischemic evaluations in the past that were either Myoview stress test or heart catheterization.  At this point since a lot of symptoms related exertional dyspnea, we will do a cardiopulmonary exercise stress test to evaluate      Coronary artery disease involving native coronary artery without angina pectoris - Primary (Chronic)    She had nonobstructive CAD the past on cardiac catheterization.  Negative Myoview in 2018.  Now still having exertional dyspnea.  Symptoms not consistent with what would be angina, but are still somewhat concerning.  May be related to pulmonary etiology, or more likely simply deconditioning.  Plan: Evaluate with CPX.-Cardiopulmonary Exercise Stress Test  She is not on beta-blocker because of kidney disease in the  past.  Remains on aspirin without statin -> pending CPS evaluation may need to adjust based on lipid levels.  On ARB and HCTZ.      Relevant Orders   EKG 12-Lead (Completed)   Cardiopulmonary exercise test   DOE (dyspnea on exertion) (Chronic)    Previously she had a negative Myoview and normal echocardiogram.  Evaluate with Cardiopulmonary Exercise Stress Test (CPX)      Relevant Orders   EKG 12-Lead (Completed)   Cardiopulmonary exercise test   Atypical chest pain (Chronic)    Right-sided shoulder and chest pain more so than left.  Not likely to be cardiac in nature, however we are evaluating with CPX.      Relevant Orders   EKG 12-Lead (Completed)   Cardiopulmonary exercise test      ===================================  HPI:    Belinda Brown is a 86 y.o. female with a PMH below who is being seen today for the evaluation of RIGHT-SIDED ARM AND SHOULDER PAIN, and SHORTNESS OF BREATH ON EXERTION at the request of Cyndi Bender, PA-C.  She was previously followed by Dr. Lennox Pippins for over 10 years for her mild nonobstructive coronary disease. She requested transfer a cardiology care to [facility] in order to be in the [place].  She had a heart catheterization 2008 which showed roughly 50% disease (unfortunately do not have the Results. She also had a stress echocardiogram done in 2016 that was presumably normal  Teisha L Labo was last seen on Jul 05, 2016 in follow-up from her appointment on June 02, 2016 for near syncopal episodes/dizziness-no LOC.  She also noted episodes of chest pain at least 2 times a week.  No rapid irregular heartbeats.  She felt fatigued.  Her insurance company would not cover coronary CTA to evaluate for possible ischemic etiology.  We therefore checked an echocardiogram and Myoview.  (See TSH below) => she still noted that she "gives out of energy "doing just about anything and has some associated chest discomfort.  Also noted dizzy spells off and  on.  No syncope or near syncope.  No significant tachycardia..  Exertional dyspnea is thought to be related to deconditioning.  BP was stable on losartan.  We have recommended avoiding restarting atenolol because of fatigue and near syncope.  Recent Hospitalizations: None  She was seen by Cyndi Bender, PA on February 23.  Referred because of chronic fatigue, mild intermittent chest discomfort.  Reviewed  CV studies:    The following studies were reviewed today: (if available, images/films reviewed: From Epic Chart or Care Everywhere) . No new studies:   Interval History:   Belinda Brown returns now after not being seen for just about 4 years.  Basically she indicates that about 2 years ago she had back surgery with significant lumbar spinal disease and neuropathic pain.  She had L4-L5 fusion which did help her pain, but since then she is just not been able to get back to her baseline level of activity again.   She says that gets short of breath and fatigue and worn out quite easily.  She also feels intermittent chest discomfort and short of breath symptoms that usually happen in the morning which is try to get going.  Once she is able to be open active, this resolves after about an hour.  The chest comfort she feels is not usually with activity.  She describes the chest discomfort as a heaviness, it can last up to an hour..    Additional symptoms she notices right arm and shoulder pain intermittently.  It seems to be more of this type discomfort then chest pain.  She has orthostatic dizziness.  She also notes insomnia, may be sleeping up about 2 hours at a time.  There is a question of possible OSA but she has not no tolerate CPAP.  CV Review of Symptoms (Summary): positive for - chest pain, dyspnea on exertion, irregular heartbeat, palpitations and Fatigue, insomnia, orthostatic dizziness negative for - edema, orthopnea, paroxysmal nocturnal dyspnea, rapid heart rate, shortness of breath or  Syncope/near syncope or TIA/amaurosis fugax, claudication  The patient does not have symptoms concerning for COVID-19 infection (fever, chills, cough, or new shortness of breath).   REVIEWED OF SYSTEMS   Review of Systems  Constitutional: Positive for malaise/fatigue. Negative for weight loss.  HENT: Negative for congestion and nosebleeds.   Respiratory: Positive for shortness of breath (Per HPI).   Cardiovascular: Positive for chest pain. Negative for palpitations, claudication and leg swelling.       Per HPI  Gastrointestinal: Negative for blood in stool and melena.  Genitourinary: Negative for frequency and hematuria.  Musculoskeletal: Positive for back pain and joint pain (Right shoulder).  Neurological: Positive for dizziness (Orthostatic). Negative for weakness (Legs sometimes get weak because of back issues.).  Psychiatric/Behavioral: Negative for memory loss. The patient is nervous/anxious and has insomnia (Sleeps maybe 2 hours at a time.).    I have reviewed and (if needed) personally updated the patient's problem list,  medications, allergies, past medical and surgical history, social and family history.   PAST MEDICAL HISTORY   Past Medical History:  Diagnosis Date  . Arthritis    back & L thumb  . Degenerative joint disease (DJD) of lumbar spine 12/2017   Had lumbar decompression and fusion (L4-L5). ->  Did note some improvement like her back pain but still struggling with back pain and fatigue.  Not back to her previous baseline.  . Hypercholesteremia   . Hypertension   . Nonobstructive atherosclerosis of coronary artery 2008   Nonobstructive disease by cath  . OAB (overactive bladder)    Mostly at night.  She takes oxybutynin but no significant improvement.  . Obstructive sleep apnea, moderate: ASI of 25 and 22 on home sleep study. 10/08/2018   Has tried 2 different masks-could not keep him on because he feels restrictive.  . Thymoma 11/24/2005   s/p resection of  thymic mass    PAST SURGICAL HISTORY   Past Surgical History:  Procedure Laterality Date  . CARDIAC CATHETERIZATION  2008   [facility]. - Dr. Geraldo Pitter -- non-obstructive CAD (by pt report - ~50% single vessel)  . CERVICAL LAMINECTOMY  1986  . EYE SURGERY     cataracts removed- bilateral   . NM MYOVIEW LTD  06/23/2016   EF 30-35% with severe hypokinesis of the distal septal, distal inferior, mid/distal anterior, mid/distal anterolateral walls and Akinesis of the apex. The cavity size was normal. Wall thickness was normal. Trivial Aortic regurgitation. Mitral Mild MR  . partial mediastinoscopy with thymectomy and resection of thymic mass  11/24/2005   [facility]  . TRANSTHORACIC ECHOCARDIOGRAM  06/19/2016   Normal LV size and function. EF 55-60%. GR 1 DD. No regional wall motion abnormalities. Mild (grade 1) DD. Mild LVH and trace AI,MR,TR  . Treadmill Stress Echo  2016   [facility]: report not avaialble.  Pt reports - Normal result  . TUBAL LIGATION     Immunization History  Administered Date(s) Administered  . Moderna Sars-Covid-2 Vaccination 03/05/2019, 04/09/2019    MEDICATIONS/ALLERGIES   Current Meds  Medication Sig  . acetaminophen (TYLENOL) 650 MG CR tablet Take 650-1,300 mg by mouth every 8 (eight) hours as needed for pain.  Marland Kitchen aspirin EC 81 MG tablet Take 81 mg by mouth daily.  . cholecalciferol (VITAMIN D) 1000 units tablet Take 1,000 Units by mouth daily.  . hydrochlorothiazide (HYDRODIURIL) 12.5 MG tablet Take 12.5 mg by mouth daily.  Marland Kitchen losartan (COZAAR) 50 MG tablet Take 50 mg by mouth daily.  . vitamin B-12 (CYANOCOBALAMIN) 500 MCG tablet Take 500 mcg by mouth daily.  . vitamin C (ASCORBIC ACID) 500 MG tablet Take 500 mg by mouth daily.  . [DISCONTINUED] fish oil-omega-3 fatty acids 1000 MG capsule Take 1 g by mouth daily.  . [DISCONTINUED] HYDROcodone-acetaminophen (NORCO) 10-325 MG tablet Take 1-2 tablets by mouth every 4 (four) hours as needed (pain).  .  [DISCONTINUED] methocarbamol (ROBAXIN) 500 MG tablet Take 1 tablet (500 mg total) by mouth every 6 (six) hours as needed for muscle spasms.  . [DISCONTINUED] naproxen sodium (ALEVE) 220 MG tablet Take 440 mg by mouth 2 (two) times daily as needed.  . [DISCONTINUED] rosuvastatin (CRESTOR) 10 MG tablet Take 10 mg by mouth daily.    Allergies  Allergen Reactions  . Ampicillin Hives    Has patient had a PCN reaction causing immediate rash, facial/tongue/throat swelling, SOB or lightheadedness with hypotension: Yes Has patient had a PCN reaction causing  severe rash involving mucus membranes or skin necrosis: No Has patient had a PCN reaction that required hospitalization: No Has patient had a PCN reaction occurring within the last 10 years: No If all of the above answers are "NO", then may proceed with Cephalosporin use.   . Codeine Other (See Comments)    hallucinations    SOCIAL HISTORY/FAMILY HISTORY   Reviewed in Epic:  Pertinent findings:  Social History   Tobacco Use  . Smoking status: Never Smoker  . Smokeless tobacco: Never Used  Substance Use Topics  . Alcohol use: Yes    Comment: not on a regular basis  . Drug use: No   Social History   Social History Narrative  . Not on file   Family History  Problem Relation Age of Onset  . Congestive Heart Failure Mother   . Heart attack Father   . Cancer Sister   . COPD Brother   . Heart failure Maternal Grandmother   . Pneumonia Maternal Grandfather   . Heart failure Paternal Grandmother   . Heart failure Paternal Grandfather     OBJCTIVE -PE, EKG, labs   Wt Readings from Last 3 Encounters:  05/17/20 139 lb (63 kg)  12/24/17 141 lb 8 oz (64.2 kg)  12/19/17 141 lb 8 oz (64.2 kg)    Physical Exam: BP 118/70 (BP Location: Left Arm, Patient Position: Sitting, Cuff Size: Normal)   Pulse 75   Ht 5\' 5"  (1.651 m)   Wt 139 lb (63 kg)   LMP  (LMP Unknown)   BMI 23.13 kg/m  Physical Exam Vitals reviewed.   Constitutional:      General: She is not in acute distress.    Appearance: She is well-developed and normal weight. She is not ill-appearing or toxic-appearing.  HENT:     Head: Normocephalic and atraumatic.  Neck:     Vascular: No carotid bruit, hepatojugular reflux or JVD.  Cardiovascular:     Rate and Rhythm: Normal rate and regular rhythm.  No extrasystoles are present.    Chest Wall: PMI is not displaced.     Pulses: Normal pulses.     Heart sounds: Normal heart sounds. No murmur heard. No friction rub. No gallop.   Pulmonary:     Effort: Pulmonary effort is normal. No respiratory distress.     Breath sounds: Normal breath sounds. No decreased breath sounds, wheezing, rhonchi or rales.  Abdominal:     General: Bowel sounds are normal.     Palpations: Abdomen is soft. There is no mass.     Comments: No HSM  Musculoskeletal:        General: No swelling. Normal range of motion.     Cervical back: Normal range of motion and neck supple.  Skin:    General: Skin is warm.  Neurological:     General: No focal deficit present.     Mental Status: She is alert and oriented to person, place, and time.  Psychiatric:        Mood and Affect: Mood normal.        Behavior: Behavior normal.        Thought Content: Thought content normal.        Judgment: Judgment normal.     Adult ECG Report  Rate: 75 ;  Rhythm: normal sinus rhythm and Left anterior fascicular block;, cannot exclude anterior MI, age-indeterminate.  Normal voltage, intervals and durations.;   Narrative Interpretation: Stable EKG  Recent Labs:  03/30/2018: TC 210,  TG 124, HDL 55, LDL 133.  Hgb 14.6, Cr 0.71, K+ 4.4. No results found for: CHOL, HDL, LDLCALC, LDLDIRECT, TRIG, CHOLHDL Lab Results  Component Value Date   CREATININE 0.78 08/12/2018   BUN 8 08/12/2018   NA 140 08/12/2018   K 4.5 08/12/2018   CL 104 08/12/2018   CO2 27 08/12/2018   CBC Latest Ref Rng & Units 08/12/2018 12/27/2017 12/26/2017  WBC 4.0 -  10.5 K/uL 5.6 - 8.7  Hemoglobin 12.0 - 15.0 g/dL 14.2 9.6(L) 8.6(L)  Hematocrit 36.0 - 46.0 % 42.5 28.5(L) 26.0(L)  Platelets 150 - 400 K/uL 187 - 115(L)    No results found for: TSH  ==================================================  COVID-19 Education: The signs and symptoms of COVID-19 were discussed with the patient and how to seek care for testing (follow up with PCP or arrange E-visit).   The importance of social distancing and COVID-19 vaccination was discussed today. The patient is practicing social distancing & Masking.   I spent a total of 34minutes with the patient spent in direct patient consultation.  Additional time spent with chart review  / charting (studies, outside notes, etc): 16 min Total Time: 45 min  Current medicines are reviewed at length with the patient today.  (+/- concerns) n/a  This visit occurred during the SARS-CoV-2 public health emergency.  Safety protocols were in place, including screening questions prior to the visit, additional usage of staff PPE, and extensive cleaning of exam room while observing appropriate contact time as indicated for disinfecting solutions.  Notice: This dictation was prepared with Dragon dictation along with smaller phrase technology. Any transcriptional errors that result from this process are unintentional and may not be corrected upon review.  Patient Instructions / Medication Changes & Studies & Tests Ordered   Patient Instructions   Medication Instructions:  Not needed *If you need a refill on your cardiac medications before your next appointment, please call your pharmacy*   Lab Work: Not needed   you may possible may need You will need a COVID-19  test  3 days prior to your procedure. Self isolate between test and procedure. This is a Drive Up Visit at [address] [place], [place] [address]. Someone will direct you to the appropriate testing line. Stay in your car and someone will be with you shortly.        Testing/Procedures: Will be schedule for [place] physician has recommended that you have a cardiopulmonary stress test (CPX). CPX testing is a non-invasive measurement of heart and lung function. It replaces a traditional treadmill stress test. This type of test provides a tremendous amount of information that relates not only to your present condition but also for future outcomes. This test combines measurements of you ventilation, respiratory gas exchange in the lungs, electrocardiogram (EKG), blood pressure and physical response before, during, and following an exercise protocol.    Follow-Up: At [facility], you and your health needs are our priority.  As part of our continuing mission to provide you with exceptional heart care, we have created designated Provider Care Teams.  These Care Teams include your primary Cardiologist (physician) and Advanced Practice Providers (APPs -  Physician Assistants and Nurse Practitioners) who all work together to provide you with the care you need, when you need it.  We recommend signing up for the patient portal called "MyChart".  Sign up information is provided on this After Visit Summary.  MyChart is used to connect with patients for Virtual Visits (Telemedicine).  Patients are able to view lab/test results, encounter notes, upcoming appointments, etc.  Non-urgent messages can be sent to your provider as well.   To learn more about what you can do with MyChart, go to [email or web address].    Your next appointment:   2 month(s)  The format for your next appointment:   In Person  Provider:   Glenetta Hew, MD   Other Instructions   Cardiopulmonary exercise testing (CPET) is a test that checks how your heart and lungs react to exercise.  Follow your doctor's instructions about food and drink, and what medicines to change or stop.  During this test, you will walk or run on a treadmill or pedal on a stationary bike. Tests  will be done as you run or walk. This information is not intended to replace advice given to you by your health care provider. Make sure you discuss any questions you have with your health care provider. Document Revised: 04/18/2018 Document Reviewed: 10/04/2017 Elsevier Patient Education  2021 [employer].    Studies Ordered:   Orders Placed This Encounter  Procedures  . Cardiopulmonary exercise test  . EKG 12-Lead     Glenetta Hew, M.D., M.S. Interventional Cardiologist   Pager # [phone] Phone # [phone] [address] [place], [place] [address]   Thank you for choosing Heartcare at [facility]!!

## 2020-05-21 ENCOUNTER — Encounter: Payer: Self-pay | Admitting: Cardiology

## 2020-05-21 NOTE — Assessment & Plan Note (Addendum)
Atenolol stopped because of concern for involvement in dizziness.  She was then started back on losartan along with HCTZ  Plan was to allow for some mild permissive hypertension.  Current blood pressure recordings are stable.  -> Would recommend using HCTZ only as a as needed since it may adversely affect her overactive bladder.

## 2020-05-21 NOTE — Assessment & Plan Note (Signed)
No further episodes

## 2020-05-21 NOTE — Assessment & Plan Note (Signed)
LDL was 133.  Given her advanced age, would be reluctant to actually start treatment now unless there is a reason to do so.  She has had nonischemic evaluations in the past that were either Myoview stress test or heart catheterization.  At this point since a lot of symptoms related exertional dyspnea, we will do a cardiopulmonary exercise stress test to evaluate

## 2020-05-21 NOTE — Assessment & Plan Note (Signed)
This is a chronic problem.  Question if it has anything do with thymoma versus not using CPAP.  Because of his exertional dyspnea associated as well, we will check CPX.

## 2020-05-21 NOTE — Assessment & Plan Note (Signed)
Right-sided shoulder and chest pain more so than left.  Not likely to be cardiac in nature, however we are evaluating with CPX.

## 2020-05-21 NOTE — Assessment & Plan Note (Signed)
She had nonobstructive CAD the past on cardiac catheterization.  Negative Myoview in 2018.  Now still having exertional dyspnea.  Symptoms not consistent with what would be angina, but are still somewhat concerning.  May be related to pulmonary etiology, or more likely simply deconditioning.  Plan: Evaluate with CPX.-Cardiopulmonary Exercise Stress Test  She is not on beta-blocker because of kidney disease in the past.  Remains on aspirin without statin -> pending CPS evaluation may need to adjust based on lipid levels.  On ARB and HCTZ.

## 2020-05-21 NOTE — Assessment & Plan Note (Addendum)
Previously she had a negative Myoview and normal echocardiogram.  Evaluate with Cardiopulmonary Exercise Stress Test (CPX)

## 2020-05-31 DIAGNOSIS — Z6824 Body mass index (BMI) 24.0-24.9, adult: Secondary | ICD-10-CM | POA: Diagnosis not present

## 2020-05-31 DIAGNOSIS — R8290 Unspecified abnormal findings in urine: Secondary | ICD-10-CM | POA: Diagnosis not present

## 2020-05-31 DIAGNOSIS — G4733 Obstructive sleep apnea (adult) (pediatric): Secondary | ICD-10-CM | POA: Diagnosis not present

## 2020-05-31 DIAGNOSIS — I2510 Atherosclerotic heart disease of native coronary artery without angina pectoris: Secondary | ICD-10-CM | POA: Diagnosis not present

## 2020-06-13 HISTORY — PX: OTHER SURGICAL HISTORY: SHX169

## 2020-06-15 ENCOUNTER — Other Ambulatory Visit (HOSPITAL_COMMUNITY): Payer: Self-pay | Admitting: *Deleted

## 2020-06-15 ENCOUNTER — Other Ambulatory Visit: Payer: Self-pay

## 2020-06-15 ENCOUNTER — Ambulatory Visit (HOSPITAL_COMMUNITY): Payer: Medicare HMO | Attending: Cardiology

## 2020-06-15 DIAGNOSIS — R0789 Other chest pain: Secondary | ICD-10-CM | POA: Diagnosis not present

## 2020-06-15 DIAGNOSIS — R0609 Other forms of dyspnea: Secondary | ICD-10-CM | POA: Insufficient documentation

## 2020-06-15 DIAGNOSIS — I2510 Atherosclerotic heart disease of native coronary artery without angina pectoris: Secondary | ICD-10-CM | POA: Diagnosis not present

## 2020-06-15 DIAGNOSIS — R0600 Dyspnea, unspecified: Secondary | ICD-10-CM | POA: Diagnosis not present

## 2020-08-17 ENCOUNTER — Ambulatory Visit: Payer: Medicare HMO | Admitting: Cardiology

## 2020-09-16 DIAGNOSIS — R6889 Other general symptoms and signs: Secondary | ICD-10-CM | POA: Diagnosis not present

## 2020-09-16 DIAGNOSIS — Z6823 Body mass index (BMI) 23.0-23.9, adult: Secondary | ICD-10-CM | POA: Diagnosis not present

## 2020-09-16 DIAGNOSIS — M545 Low back pain, unspecified: Secondary | ICD-10-CM | POA: Diagnosis not present

## 2020-09-16 DIAGNOSIS — Z20822 Contact with and (suspected) exposure to covid-19: Secondary | ICD-10-CM | POA: Diagnosis not present

## 2020-09-16 DIAGNOSIS — R5383 Other fatigue: Secondary | ICD-10-CM | POA: Diagnosis not present

## 2020-09-23 DIAGNOSIS — M2560 Stiffness of unspecified joint, not elsewhere classified: Secondary | ICD-10-CM | POA: Diagnosis not present

## 2020-09-23 DIAGNOSIS — M545 Low back pain, unspecified: Secondary | ICD-10-CM | POA: Diagnosis not present

## 2020-09-23 DIAGNOSIS — M6281 Muscle weakness (generalized): Secondary | ICD-10-CM | POA: Diagnosis not present

## 2020-09-28 DIAGNOSIS — M2560 Stiffness of unspecified joint, not elsewhere classified: Secondary | ICD-10-CM | POA: Diagnosis not present

## 2020-09-28 DIAGNOSIS — M545 Low back pain, unspecified: Secondary | ICD-10-CM | POA: Diagnosis not present

## 2020-09-28 DIAGNOSIS — M6281 Muscle weakness (generalized): Secondary | ICD-10-CM | POA: Diagnosis not present

## 2020-09-30 DIAGNOSIS — M545 Low back pain, unspecified: Secondary | ICD-10-CM | POA: Diagnosis not present

## 2020-09-30 DIAGNOSIS — M6281 Muscle weakness (generalized): Secondary | ICD-10-CM | POA: Diagnosis not present

## 2020-09-30 DIAGNOSIS — M2560 Stiffness of unspecified joint, not elsewhere classified: Secondary | ICD-10-CM | POA: Diagnosis not present

## 2020-10-05 DIAGNOSIS — M2560 Stiffness of unspecified joint, not elsewhere classified: Secondary | ICD-10-CM | POA: Diagnosis not present

## 2020-10-05 DIAGNOSIS — M545 Low back pain, unspecified: Secondary | ICD-10-CM | POA: Diagnosis not present

## 2020-10-05 DIAGNOSIS — M6281 Muscle weakness (generalized): Secondary | ICD-10-CM | POA: Diagnosis not present

## 2020-10-07 DIAGNOSIS — M545 Low back pain, unspecified: Secondary | ICD-10-CM | POA: Diagnosis not present

## 2020-10-07 DIAGNOSIS — M6281 Muscle weakness (generalized): Secondary | ICD-10-CM | POA: Diagnosis not present

## 2020-10-07 DIAGNOSIS — M2560 Stiffness of unspecified joint, not elsewhere classified: Secondary | ICD-10-CM | POA: Diagnosis not present

## 2020-10-12 DIAGNOSIS — M545 Low back pain, unspecified: Secondary | ICD-10-CM | POA: Diagnosis not present

## 2020-10-12 DIAGNOSIS — M2560 Stiffness of unspecified joint, not elsewhere classified: Secondary | ICD-10-CM | POA: Diagnosis not present

## 2020-10-12 DIAGNOSIS — M6281 Muscle weakness (generalized): Secondary | ICD-10-CM | POA: Diagnosis not present

## 2020-10-14 DIAGNOSIS — M6281 Muscle weakness (generalized): Secondary | ICD-10-CM | POA: Diagnosis not present

## 2020-10-14 DIAGNOSIS — M545 Low back pain, unspecified: Secondary | ICD-10-CM | POA: Diagnosis not present

## 2020-10-14 DIAGNOSIS — M2560 Stiffness of unspecified joint, not elsewhere classified: Secondary | ICD-10-CM | POA: Diagnosis not present

## 2020-10-21 DIAGNOSIS — L300 Nummular dermatitis: Secondary | ICD-10-CM | POA: Diagnosis not present

## 2020-10-21 DIAGNOSIS — D225 Melanocytic nevi of trunk: Secondary | ICD-10-CM | POA: Diagnosis not present

## 2020-10-21 DIAGNOSIS — D2239 Melanocytic nevi of other parts of face: Secondary | ICD-10-CM | POA: Diagnosis not present

## 2020-10-21 DIAGNOSIS — L821 Other seborrheic keratosis: Secondary | ICD-10-CM | POA: Diagnosis not present

## 2020-10-22 DIAGNOSIS — M6281 Muscle weakness (generalized): Secondary | ICD-10-CM | POA: Diagnosis not present

## 2020-10-22 DIAGNOSIS — M2560 Stiffness of unspecified joint, not elsewhere classified: Secondary | ICD-10-CM | POA: Diagnosis not present

## 2020-10-22 DIAGNOSIS — M545 Low back pain, unspecified: Secondary | ICD-10-CM | POA: Diagnosis not present

## 2020-10-28 DIAGNOSIS — M6281 Muscle weakness (generalized): Secondary | ICD-10-CM | POA: Diagnosis not present

## 2020-10-28 DIAGNOSIS — M545 Low back pain, unspecified: Secondary | ICD-10-CM | POA: Diagnosis not present

## 2020-10-28 DIAGNOSIS — M2560 Stiffness of unspecified joint, not elsewhere classified: Secondary | ICD-10-CM | POA: Diagnosis not present

## 2020-11-03 DIAGNOSIS — M6281 Muscle weakness (generalized): Secondary | ICD-10-CM | POA: Diagnosis not present

## 2020-11-03 DIAGNOSIS — M2560 Stiffness of unspecified joint, not elsewhere classified: Secondary | ICD-10-CM | POA: Diagnosis not present

## 2020-11-03 DIAGNOSIS — M545 Low back pain, unspecified: Secondary | ICD-10-CM | POA: Diagnosis not present

## 2020-11-06 DIAGNOSIS — I10 Essential (primary) hypertension: Secondary | ICD-10-CM | POA: Diagnosis not present

## 2020-11-06 DIAGNOSIS — M79605 Pain in left leg: Secondary | ICD-10-CM | POA: Diagnosis not present

## 2020-11-06 DIAGNOSIS — Z885 Allergy status to narcotic agent status: Secondary | ICD-10-CM | POA: Diagnosis not present

## 2020-11-06 DIAGNOSIS — Z79899 Other long term (current) drug therapy: Secondary | ICD-10-CM | POA: Diagnosis not present

## 2020-11-06 DIAGNOSIS — M791 Myalgia, unspecified site: Secondary | ICD-10-CM | POA: Diagnosis not present

## 2020-11-06 DIAGNOSIS — M79652 Pain in left thigh: Secondary | ICD-10-CM | POA: Diagnosis not present

## 2020-11-06 DIAGNOSIS — E785 Hyperlipidemia, unspecified: Secondary | ICD-10-CM | POA: Diagnosis not present

## 2020-11-06 DIAGNOSIS — I2510 Atherosclerotic heart disease of native coronary artery without angina pectoris: Secondary | ICD-10-CM | POA: Diagnosis not present

## 2020-11-06 DIAGNOSIS — Z7982 Long term (current) use of aspirin: Secondary | ICD-10-CM | POA: Diagnosis not present

## 2020-11-06 DIAGNOSIS — M79662 Pain in left lower leg: Secondary | ICD-10-CM | POA: Diagnosis not present

## 2020-11-09 DIAGNOSIS — G4733 Obstructive sleep apnea (adult) (pediatric): Secondary | ICD-10-CM | POA: Diagnosis not present

## 2020-11-09 DIAGNOSIS — Z6823 Body mass index (BMI) 23.0-23.9, adult: Secondary | ICD-10-CM | POA: Diagnosis not present

## 2020-11-09 DIAGNOSIS — M545 Low back pain, unspecified: Secondary | ICD-10-CM | POA: Diagnosis not present

## 2020-11-09 DIAGNOSIS — E785 Hyperlipidemia, unspecified: Secondary | ICD-10-CM | POA: Diagnosis not present

## 2020-11-09 DIAGNOSIS — M5416 Radiculopathy, lumbar region: Secondary | ICD-10-CM | POA: Diagnosis not present

## 2020-11-09 DIAGNOSIS — I10 Essential (primary) hypertension: Secondary | ICD-10-CM | POA: Diagnosis not present

## 2020-11-09 DIAGNOSIS — N3281 Overactive bladder: Secondary | ICD-10-CM | POA: Diagnosis not present

## 2020-11-09 DIAGNOSIS — Z139 Encounter for screening, unspecified: Secondary | ICD-10-CM | POA: Diagnosis not present

## 2020-11-09 DIAGNOSIS — R7303 Prediabetes: Secondary | ICD-10-CM | POA: Diagnosis not present

## 2020-11-09 DIAGNOSIS — I2510 Atherosclerotic heart disease of native coronary artery without angina pectoris: Secondary | ICD-10-CM | POA: Diagnosis not present

## 2020-11-15 DIAGNOSIS — B029 Zoster without complications: Secondary | ICD-10-CM | POA: Diagnosis not present

## 2020-11-22 DIAGNOSIS — M21372 Foot drop, left foot: Secondary | ICD-10-CM | POA: Diagnosis not present

## 2020-11-22 DIAGNOSIS — Z6823 Body mass index (BMI) 23.0-23.9, adult: Secondary | ICD-10-CM | POA: Diagnosis not present

## 2020-11-22 DIAGNOSIS — M5416 Radiculopathy, lumbar region: Secondary | ICD-10-CM | POA: Diagnosis not present

## 2020-11-22 DIAGNOSIS — B029 Zoster without complications: Secondary | ICD-10-CM | POA: Diagnosis not present

## 2020-11-25 DIAGNOSIS — M5416 Radiculopathy, lumbar region: Secondary | ICD-10-CM | POA: Diagnosis not present

## 2020-11-25 DIAGNOSIS — I10 Essential (primary) hypertension: Secondary | ICD-10-CM | POA: Diagnosis not present

## 2020-12-03 DIAGNOSIS — M79602 Pain in left arm: Secondary | ICD-10-CM | POA: Diagnosis not present

## 2020-12-03 DIAGNOSIS — E785 Hyperlipidemia, unspecified: Secondary | ICD-10-CM | POA: Diagnosis not present

## 2020-12-03 DIAGNOSIS — I2510 Atherosclerotic heart disease of native coronary artery without angina pectoris: Secondary | ICD-10-CM | POA: Diagnosis not present

## 2020-12-03 DIAGNOSIS — M79605 Pain in left leg: Secondary | ICD-10-CM | POA: Diagnosis not present

## 2020-12-03 DIAGNOSIS — M545 Low back pain, unspecified: Secondary | ICD-10-CM | POA: Diagnosis not present

## 2020-12-03 DIAGNOSIS — Z981 Arthrodesis status: Secondary | ICD-10-CM | POA: Diagnosis not present

## 2020-12-03 DIAGNOSIS — Z7982 Long term (current) use of aspirin: Secondary | ICD-10-CM | POA: Diagnosis not present

## 2020-12-03 DIAGNOSIS — Z79899 Other long term (current) drug therapy: Secondary | ICD-10-CM | POA: Diagnosis not present

## 2020-12-03 DIAGNOSIS — Z5329 Procedure and treatment not carried out because of patient's decision for other reasons: Secondary | ICD-10-CM | POA: Diagnosis not present

## 2020-12-03 DIAGNOSIS — I10 Essential (primary) hypertension: Secondary | ICD-10-CM | POA: Diagnosis not present

## 2020-12-07 ENCOUNTER — Emergency Department (HOSPITAL_COMMUNITY)
Admission: EM | Admit: 2020-12-07 | Discharge: 2020-12-08 | Disposition: A | Discharge status: Home / Self Care | Payer: Medicare HMO | Attending: Emergency Medicine | Admitting: Emergency Medicine

## 2020-12-07 ENCOUNTER — Other Ambulatory Visit: Payer: Self-pay

## 2020-12-07 DIAGNOSIS — M4606 Spinal enthesopathy, lumbar region: Secondary | ICD-10-CM | POA: Diagnosis not present

## 2020-12-07 DIAGNOSIS — M48061 Spinal stenosis, lumbar region without neurogenic claudication: Secondary | ICD-10-CM | POA: Diagnosis not present

## 2020-12-07 DIAGNOSIS — Z79899 Other long term (current) drug therapy: Secondary | ICD-10-CM | POA: Diagnosis not present

## 2020-12-07 DIAGNOSIS — Z7982 Long term (current) use of aspirin: Secondary | ICD-10-CM | POA: Diagnosis not present

## 2020-12-07 DIAGNOSIS — M4800 Spinal stenosis, site unspecified: Secondary | ICD-10-CM | POA: Diagnosis not present

## 2020-12-07 DIAGNOSIS — M5416 Radiculopathy, lumbar region: Secondary | ICD-10-CM | POA: Insufficient documentation

## 2020-12-07 DIAGNOSIS — I2510 Atherosclerotic heart disease of native coronary artery without angina pectoris: Secondary | ICD-10-CM | POA: Diagnosis not present

## 2020-12-07 DIAGNOSIS — M419 Scoliosis, unspecified: Secondary | ICD-10-CM | POA: Diagnosis not present

## 2020-12-07 DIAGNOSIS — M5126 Other intervertebral disc displacement, lumbar region: Secondary | ICD-10-CM | POA: Diagnosis not present

## 2020-12-07 DIAGNOSIS — M79605 Pain in left leg: Secondary | ICD-10-CM | POA: Diagnosis not present

## 2020-12-07 DIAGNOSIS — R531 Weakness: Secondary | ICD-10-CM | POA: Insufficient documentation

## 2020-12-07 DIAGNOSIS — M545 Low back pain, unspecified: Secondary | ICD-10-CM | POA: Diagnosis not present

## 2020-12-07 DIAGNOSIS — I10 Essential (primary) hypertension: Secondary | ICD-10-CM | POA: Insufficient documentation

## 2020-12-07 LAB — BASIC METABOLIC PANEL
Anion gap: 10 (ref 5–15)
BUN: 12 mg/dL (ref 8–23)
CO2: 28 mmol/L (ref 22–32)
Calcium: 9.7 mg/dL (ref 8.9–10.3)
Chloride: 91 mmol/L — ABNORMAL LOW (ref 98–111)
Creatinine, Ser: 0.67 mg/dL (ref 0.44–1.00)
GFR, Estimated: >60 mL/min (ref >60)
Glucose, Bld: 123 mg/dL — ABNORMAL HIGH (ref 70–99)
Potassium: 3.9 mmol/L (ref 3.5–5.1)
Sodium: 129 mmol/L — ABNORMAL LOW (ref 135–145)

## 2020-12-07 LAB — CBC WITH DIFFERENTIAL/PLATELET
Abs Immature Granulocytes: 0.01 10*3/uL (ref 0.00–0.07)
Basophils Absolute: 0 10*3/uL (ref 0.0–0.1)
Basophils Relative: 1 %
Eosinophils Absolute: 0.1 10*3/uL (ref 0.0–0.5)
Eosinophils Relative: 2 %
HCT: 41.1 % (ref 36.0–46.0)
Hemoglobin: 13.8 g/dL (ref 12.0–15.0)
Immature Granulocytes: 0 %
Lymphocytes Relative: 17 %
Lymphs Abs: 0.9 10*3/uL (ref 0.7–4.0)
MCH: 31.9 pg (ref 26.0–34.0)
MCHC: 33.6 g/dL (ref 30.0–36.0)
MCV: 94.9 fL (ref 80.0–100.0)
Monocytes Absolute: 0.7 10*3/uL (ref 0.1–1.0)
Monocytes Relative: 13 %
Neutro Abs: 3.5 10*3/uL (ref 1.7–7.7)
Neutrophils Relative %: 67 %
Platelets: 292 10*3/uL (ref 150–400)
RBC: 4.33 MIL/uL (ref 3.87–5.11)
RDW: 13.5 % (ref 11.5–15.5)
WBC: 5.2 10*3/uL (ref 4.0–10.5)
nRBC: 0 % (ref 0.0–0.2)

## 2020-12-07 MED ORDER — LORAZEPAM 2 MG/ML IJ SOLN
1.0000 mg | Freq: Once | INTRAMUSCULAR | Status: AC | PRN
  Administered 2020-12-08: 1 mg via INTRAVENOUS
  Filled 2020-12-07: qty 1

## 2020-12-07 NOTE — ED Provider Notes (Signed)
Emergency Medicine Provider Triage Evaluation Note  Belinda Brown , a 86 y.o. female  was evaluated in triage.  Pt complains of low back pain worsening over the last several weeks particularly in the last 2 days now with worsening weakness in the left lower extremity, no longer able to completely lift at the hip, and of dragging the foot.  Denies any saddle anesthesia.  Decreased sensation in the left lower extremity as well.  Review of Systems  Positive: Low back pain with left lower extremity weakness and numbness negative: Chest pain shortness of breath, palpitations, recent falls  Physical Exam  BP (!) 132/102 (BP Location: Left Arm)   Pulse 84   Temp 98.6 F (37 C) (Oral)   Resp 16   Ht 5\' 5"  (1.651 m)   Wt 63 kg   LMP  (LMP Unknown)   SpO2 98%   BMI 23.13 kg/m  Gen:   Awake, no distress   Resp:  Normal effort  MSK:   Moves extremities without difficulty  Other:  Palpation of the lumbar midline without tenderness palpation of the paraspinous musculature.  Significantly decreased left lower extremity strength in knee flexion extension and hip flexion relative to the right.  Decreased strength in plantar flexion as well.  Decreased sensation in the anterior left lower extremity.  Medical Decision Making  Medically screening exam initiated at 9:49 PM.  Appropriate orders placed.  Belinda Brown was informed that the remainder of the evaluation will be completed by another provider, this initial triage assessment does not replace that evaluation, and the importance of remaining in the ED until their evaluation is complete.  MR lumbar spine ordered for further evaluation given acute change in strength and sensation left lower extremity with history of lumbar degenerative disease and lumbar fusion in the past.  Patient states she does require axiolysis for MRI.  This chart was dictated using voice recognition software, Dragon. Despite the best efforts of this provider to proofread and  correct errors, errors may still occur which can change documentation meaning.    Aura Dials 12/07/20 2205    Charlesetta Shanks, MD 12/08/20 1327

## 2020-12-07 NOTE — ED Triage Notes (Addendum)
Pt c/o left leg pain. Has been ongoing for 4 weeks, but worse past two days. For past two days has experience progression of pain, numbness, and weakness. Has been treated for pinched nerve and shingles by PCP. Pending MRI concerning pinched nerve by neurologist. Denies left leg swelling. No urinary incontinence.

## 2020-12-08 ENCOUNTER — Telehealth (HOSPITAL_COMMUNITY): Payer: Self-pay | Admitting: Student

## 2020-12-08 ENCOUNTER — Emergency Department (HOSPITAL_COMMUNITY): Payer: Medicare HMO

## 2020-12-08 DIAGNOSIS — M5126 Other intervertebral disc displacement, lumbar region: Secondary | ICD-10-CM | POA: Diagnosis not present

## 2020-12-08 DIAGNOSIS — M4606 Spinal enthesopathy, lumbar region: Secondary | ICD-10-CM | POA: Diagnosis not present

## 2020-12-08 DIAGNOSIS — M48061 Spinal stenosis, lumbar region without neurogenic claudication: Secondary | ICD-10-CM | POA: Diagnosis not present

## 2020-12-08 DIAGNOSIS — M419 Scoliosis, unspecified: Secondary | ICD-10-CM | POA: Diagnosis not present

## 2020-12-08 MED ORDER — HYDROCODONE-ACETAMINOPHEN 5-325 MG PO TABS: 2.0000 | ORAL_TABLET | ORAL | 0 refills | Status: DC | PRN

## 2020-12-08 MED ORDER — SODIUM CHLORIDE 0.9 % IV BOLUS
500.0000 mL | Freq: Once | INTRAVENOUS | Status: AC
  Administered 2020-12-08: 500 mL via INTRAVENOUS

## 2020-12-08 MED ORDER — OXYCODONE-ACETAMINOPHEN 5-325 MG PO TABS: 1.0000 | ORAL_TABLET | Freq: Four times a day (QID) | ORAL | 0 refills | Status: DC | PRN

## 2020-12-08 MED ORDER — MORPHINE SULFATE (PF) 4 MG/ML IV SOLN
4.0000 mg | Freq: Once | INTRAVENOUS | Status: AC
  Administered 2020-12-08: 4 mg via INTRAVENOUS
  Filled 2020-12-08: qty 1

## 2020-12-08 MED ORDER — PREGABALIN 75 MG PO CAPS: 75.0000 mg | ORAL_CAPSULE | Freq: Two times a day (BID) | ORAL | 0 refills | Status: DC

## 2020-12-08 NOTE — Progress Notes (Signed)
Patient presents to the emergency room with ongoing left lower extremity numbness and weakness.  Patient status post herpetic shingles eruption approximately 4 weeks ago with severe radicular pain numbness and weakness into her left lower extremity involving her L4 nerve root distribution.  Patient denies any significant back pain.  She is having no right lower extremity symptoms.  She is having no bowel or bladder dysfunction.  Her herpetic eruptions have largely healed but she continues to have nerve pain.  She is taking Neurontin with only minimal relief.  The patient underwent a follow-up MRI scan which demonstrates stable appearance to prior lumbar decompression and fusion L3-4 and L4-5.  The patient has moderate adjacent level stenosis at L2-3 which is unchanged from 8 months ago.  She has no new findings to explain the worsening of her symptoms.  I believe the patient is suffering symptoms of postherpetic neuralgia with some myotomal involvement causing her weakness.  I do not think that surgery has any role in treatment of her condition.  I would change her Neurontin to Lyrica in hopes of giving her better relief.  I will plan on seeing her back in the office as scheduled.

## 2020-12-08 NOTE — ED Notes (Signed)
Pt verbalized understanding of d/c instructions, meds, and followup care. Denies questions. VSS, no distress noted. Steady gait to exit with all belongings. Follow up neuro appointment discussed. Family present for education.

## 2020-12-08 NOTE — ED Provider Notes (Signed)
Patient care assumed from [facility] up still.  Please see her note for full history and detailed physical exam findings.  In short, patient had a previous lumbar surgery 3 years ago and recently was seen by the neurosurgeon in February 2022 for spinal injection.  She has failed outpatient supportive care with ibuprofen, Tylenol, gabapentin for pain.  Over the last 2 weeks she has been having progressively more severe pain in left foot weakness.  The last 2 days she has been unable to lift the foot up when walking and has fallen once, she reports complete numbness of the left lower portion of her leg.  Physical Exam  BP (!) 187/92   Pulse 73   Temp 98.6 F (37 C) (Oral)   Resp 16   Ht 5\' 5"  (1.651 m)   Wt 63 kg   LMP  (LMP Unknown)   SpO2 100%   BMI 23.13 kg/m   Physical Exam Vitals and nursing note reviewed. Exam conducted with a chaperone present.  Constitutional:      General: She is not in acute distress.    Appearance: Normal appearance.  HENT:     Head: Normocephalic and atraumatic.  Eyes:     General: No scleral icterus.    Extraocular Movements: Extraocular movements intact.     Pupils: Pupils are equal, round, and reactive to light.  Skin:    Coloration: Skin is not jaundiced.  Neurological:     Mental Status: She is alert and oriented to person, place, and time.     Sensory: Sensory deficit present.     Motor: Weakness present.     Coordination: Coordination abnormal.     Gait: Gait abnormal.     Comments: Foot drop to the left, decreased knee extension and flexion.  Decreased dorsiflexion, intact plantar flexion to the left.  Intact rectal tone.   ED Course/Procedures     Procedures  MDM  No left-sided dorsiflexion, weak plantar flexion. Markedly decreased light touch. Her neurosurgeon is Dr. Annette Stable.    Patient is still pending MRI, disposition is pending results of the MRI.  Patient was hyponatremic to 129, she was given normal saline.  Per my physical exam,  patient has decreased rectal tone.  Intact plantarflexion, diminished dorsiflexion.  Diminished knee extension.  Given the finding of severe spinal stenosis on the MRI I will consult with neurosurgery for their recommendations on how to proceed.  Spoke with Dr. Trenton Gammon with neurosurgery, he evaluated the patient personally himself in the ED setting.  He advises starting patient on Lyrica 75 mg twice daily and having her follow-up in the office with him.  We will proceed with that plan.  Patient recently had outbreak of herpetic neuropathy he to the leg, suspect that is the source of her pain.  Her spinal stenosis is unchanged from when he saw her in the office in February.   Patient discharged in stable condition.     Sherrill Raring, PA-C 12/08/20 1013    Luna Fuse, MD 12/08/20 1104

## 2020-12-08 NOTE — ED Provider Notes (Signed)
[facility] EMERGENCY DEPARTMENT Provider Note   CSN: [id number] Arrival date & time: 12/07/20  1815     History Chief Complaint  Patient presents with   Leg Pain    Belinda Brown is a 86 y.o. female.  Patient to ED with severe left lower back pain. She has had previous lumbar surgery ([facility]), last seen in February of this year and had injections that provided little temporary relief. She has been taking ibuprofen, Tylenol and gabapentin for pain but over the last 2 weeks the pain has gotten progressively more severe, and in the last 2 days she developed weakness of the left foot, unable to pick the foot up when walking, has fallen x 1, and reports almost complete numbness of the lower portion of the leg. No abdominal pain, loss of bladder control. She reports she has had loose stool and has lost partial control before getting to the bathroom. Denies saddle anesthesia.   The history is provided by the patient. No language interpreter was used.  Leg Pain Location:  Ankle Associated symptoms: back pain   Associated symptoms: no fever       Past Medical History:  Diagnosis Date   Arthritis    back & L thumb   Degenerative joint disease (DJD) of lumbar spine 12/2017   Had lumbar decompression and fusion (L4-L5). ->  Did note some improvement like her back pain but still struggling with back pain and fatigue.  Not back to her previous baseline.   Hypercholesteremia    Hypertension    Nonobstructive atherosclerosis of coronary artery 2008   Nonobstructive disease by cath   OAB (overactive bladder)    Mostly at night.  She takes oxybutynin but no significant improvement.   Obstructive sleep apnea, moderate: ASI of 25 and 22 on home sleep study. 10/08/2018   Has tried 2 different masks-could not keep him on because he feels restrictive.   Thymoma 11/24/2005   s/p resection of thymic mass    Patient Active Problem List   Diagnosis Date Noted   Lumbar stenosis  with neurogenic claudication 12/24/2017   Abdominal pain 10/23/2016   Nonobstructive atherosclerosis of coronary artery 06/05/2016   DOE (dyspnea on exertion) 06/02/2016   H/O thymoma 06/02/2016   Atypical chest pain 06/02/2016   Fatigue 06/02/2016   Near syncope 06/02/2016   Thymoma    Hypercholesteremia    Coronary artery disease involving native coronary artery without angina pectoris    Essential hypertension     Past Surgical History:  Procedure Laterality Date   CARDIAC CATHETERIZATION  2008   [facility]. - Dr. Geraldo Pitter -- non-obstructive CAD (by pt report - ~50% single vessel)   CERVICAL LAMINECTOMY  1986   EYE SURGERY     cataracts removed- bilateral    NM MYOVIEW LTD  06/23/2016   EF 30-35% with severe hypokinesis of the distal septal, distal inferior, mid/distal anterior, mid/distal anterolateral walls and Akinesis of the apex. The cavity size was normal. Wall thickness was normal. Trivial Aortic regurgitation. Mitral Mild MR   partial mediastinoscopy with thymectomy and resection of thymic mass  11/24/2005   [facility]   TRANSTHORACIC ECHOCARDIOGRAM  06/19/2016   Normal LV size and function. EF 55-60%. GR 1 DD. No regional wall motion abnormalities. Mild (grade 1) DD. Mild LVH and trace AI,MR,TR   Treadmill Stress Echo  2016   [facility]: report not avaialble.  Pt reports - Normal result   TUBAL LIGATION  OB History   No obstetric history on file.     Family History  Problem Relation Age of Onset   Congestive Heart Failure Mother    Heart attack Father    Cancer Sister    COPD Brother    Heart failure Maternal Grandmother    Pneumonia Maternal Grandfather    Heart failure Paternal Grandmother    Heart failure Paternal Grandfather     Social History   Tobacco Use   Smoking status: Never   Smokeless tobacco: Never  Substance Use Topics   Alcohol use: Yes    Comment: not on a regular basis   Drug use: No    Home Medications Prior to  Admission medications   Medication Sig Start Date End Date Taking? Authorizing Provider  acetaminophen (TYLENOL) 650 MG CR tablet Take 650-1,300 mg by mouth every 8 (eight) hours as needed for pain.    [provider]  aspirin EC 81 MG tablet Take 81 mg by mouth daily.    [provider]  cholecalciferol (VITAMIN D) 1000 units tablet Take 1,000 Units by mouth daily.    [provider]  hydrochlorothiazide (HYDRODIURIL) 12.5 MG tablet Take 12.5 mg by mouth daily. 10/02/17   [provider]  losartan (COZAAR) 50 MG tablet Take 50 mg by mouth daily. 10/02/17   [provider]  vitamin B-12 (CYANOCOBALAMIN) 500 MCG tablet Take 500 mcg by mouth daily.    [provider]  vitamin C (ASCORBIC ACID) 500 MG tablet Take 500 mg by mouth daily.    [provider]    Allergies    Ampicillin and Codeine  Review of Systems   Review of Systems  Constitutional:  Negative for fever.  Respiratory:  Negative for shortness of breath.   Cardiovascular:  Negative for chest pain.  Gastrointestinal:  Positive for nausea. Negative for abdominal pain and vomiting.  Genitourinary:  Negative for enuresis.  Musculoskeletal:  Positive for back pain.  Skin:  Negative for color change.  Neurological:  Positive for weakness and numbness.   Physical Exam Updated Vital Signs BP (!) 155/92 (BP Location: Left Arm)   Pulse 79   Temp 98.6 F (37 C) (Oral)   Resp 18   Ht 5\' 5"  (1.651 m)   Wt 63 kg   LMP  (LMP Unknown)   SpO2 100%   BMI 23.13 kg/m   Physical Exam Vitals and nursing note reviewed.  Constitutional:      Appearance: She is well-developed.  HENT:     Head: Normocephalic.  Cardiovascular:     Rate and Rhythm: Normal rate and regular rhythm.     Pulses: Normal pulses.     Heart sounds: No murmur heard. Pulmonary:     Effort: Pulmonary effort is normal.     Breath sounds: Normal breath sounds. No wheezing, rhonchi or rales.  Abdominal:      General: Bowel sounds are normal.     Palpations: Abdomen is soft.     Tenderness: There is no abdominal tenderness. There is no guarding or rebound.  Musculoskeletal:        General: Normal range of motion.     Cervical back: Normal range of motion and neck supple.     Comments: Plantar flexion on left significantly weak, unable to dorsiflex. Ambulation not attempted.  Skin:    General: Skin is warm and dry.  Neurological:     General: No focal deficit present.     Mental Status:  She is alert and oriented to person, place, and time.     Sensory: Sensory deficit (Numbness to light touch left LE, decreased sensation to light touch lateral, left distal thigh.) present.     Comments: Absent patellar reflex on left compared to hyporeflexic patellar on right.     ED Results / Procedures / Treatments   Labs (all labs ordered are listed, but only abnormal results are displayed) Labs Reviewed  BASIC METABOLIC PANEL - Abnormal; Notable for the following components:      Result Value   Sodium 129 (*)    Chloride 91 (*)    Glucose, Bld 123 (*)    All other components within normal limits  CBC WITH DIFFERENTIAL/PLATELET    EKG None  Radiology No results found.  Procedures Procedures   Medications Ordered in ED Medications  LORazepam (ATIVAN) injection 1 mg (has no administration in time range)  morphine 4 MG/ML injection 4 mg (has no administration in time range)    ED Course  I have reviewed the triage vital signs and the nursing notes.  Pertinent labs & imaging results that were available during my care of the patient were reviewed by me and considered in my medical decision making (see chart for details).    MDM Rules/Calculators/A&P                           Patient to ED with ss/sxs concerning for cauda equina vs herniated NP vs severe radiculopathy with left leg numbness, weakness. MRI pending. Pain medication ordered.   Patient care signed out to [facility],  PA-C, pending review of MRI and appropriate disposition.   Final Clinical Impression(s) / ED Diagnoses Final diagnoses:  None   Back pain Lumbar radiculopathy  Rx / DC Orders ED Discharge Orders     None        Charlann Lange, PA-C 12/08/20 0700    Quintella Reichert, MD 12/09/20 [phone]

## 2020-12-08 NOTE — Discharge Instructions (Addendum)
Take Lyrica 75 mg twice daily.  Discontinue taking the gabapentin.    Take the pain medicine as needed for pain.    Call Dr. Irven [place] office and schedule follow-up for later this week or early next week.  Return to the ED if things change or worsen.

## 2020-12-08 NOTE — Telephone Encounter (Signed)
Meds sent

## 2020-12-16 DIAGNOSIS — I10 Essential (primary) hypertension: Secondary | ICD-10-CM | POA: Diagnosis not present

## 2020-12-16 DIAGNOSIS — B0229 Other postherpetic nervous system involvement: Secondary | ICD-10-CM | POA: Diagnosis not present

## 2020-12-23 DIAGNOSIS — M5416 Radiculopathy, lumbar region: Secondary | ICD-10-CM | POA: Diagnosis not present

## 2020-12-23 DIAGNOSIS — Z6822 Body mass index (BMI) 22.0-22.9, adult: Secondary | ICD-10-CM | POA: Diagnosis not present

## 2020-12-23 DIAGNOSIS — M21372 Foot drop, left foot: Secondary | ICD-10-CM | POA: Diagnosis not present

## 2020-12-23 DIAGNOSIS — B029 Zoster without complications: Secondary | ICD-10-CM | POA: Diagnosis not present

## 2020-12-28 DIAGNOSIS — M5416 Radiculopathy, lumbar region: Secondary | ICD-10-CM | POA: Diagnosis not present

## 2020-12-29 ENCOUNTER — Other Ambulatory Visit: Payer: Self-pay | Admitting: Neurosurgery

## 2021-01-10 NOTE — Progress Notes (Signed)
Surgical Instructions    Your procedure is scheduled on Friday, December 2nd.  Report to [facility] Main Entrance "A" at 9:20 A.M., then check in with the Admitting office.  Call this number if you have problems the morning of surgery:  [phone]   If you have any questions prior to your surgery date call [phone]: Open Monday-Friday 8am-4pm    Remember:  Do not eat or drink after midnight the night before your surgery     Take these medicines the morning of surgery with A SIP OF WATER Pregabalin (Lyrica)  If Needed: Hydrocodone-Acetaminophen (Norco)   Follow your surgeon's instructions on when to stop Aspirin.  If no instructions were given by your surgeon then you will need to call the office to get those instructions.     As of today, STOP taking any Aleve, Naproxen, Ibuprofen, Motrin, Advil, Goody's, BC's, all herbal medications, fish oil, and all vitamins.   DAY OF SURGERY         Do not wear jewelry, makeup, or nail polish Do not wear lotions, powders, perfumes, or deodorant. Do not shave 48 hours prior to surgery.   Do not bring valuables to the hospital.             [facility] is not responsible for any belongings or valuables.  Do NOT Smoke (Tobacco/Vaping)  24 hours prior to your procedure  If you use a CPAP at night, you may bring your mask for your overnight stay.   Contacts, glasses, hearing aids, dentures or partials may not be worn into surgery, please bring cases for these belongings   For patients admitted to the hospital, discharge time will be determined by your treatment team.   Patients discharged the day of surgery will not be allowed to drive home, and someone needs to stay with them for 24 hours.  NO VISITORS WILL BE ALLOWED IN PRE-OP WHERE PATIENTS ARE PREPPED FOR SURGERY.  ONLY 1 SUPPORT PERSON MAY BE PRESENT IN THE WAITING ROOM WHILE YOU ARE IN SURGERY.  IF YOU ARE TO BE ADMITTED, ONCE YOU ARE IN YOUR ROOM YOU WILL BE ALLOWED TWO (2)  VISITORS. 1 (ONE) VISITOR MAY STAY OVERNIGHT BUT MUST ARRIVE TO THE ROOM BY 8pm.  Minor children may have two parents present. Special consideration for safety and communication needs will be reviewed on a case by case basis.  Special instructions:    Oral Hygiene is also important to reduce your risk of infection.  Remember - BRUSH YOUR TEETH THE MORNING OF SURGERY WITH YOUR REGULAR TOOTHPASTE   [facility]- Preparing For Surgery  Before surgery, you can play an important role. Because skin is not sterile, your skin needs to be as free of germs as possible. You can reduce the number of germs on your skin by washing with CHG (chlorahexidine gluconate) Soap before surgery.  CHG is an antiseptic cleaner which kills germs and bonds with the skin to continue killing germs even after washing.     Please do not use if you have an allergy to CHG or antibacterial soaps. If your skin becomes reddened/irritated stop using the CHG.  Do not shave (including legs and underarms) for at least 48 hours prior to first CHG shower. It is OK to shave your face.  Please follow these instructions carefully.     Shower the NIGHT BEFORE SURGERY and the MORNING OF SURGERY with CHG Soap.   If you chose to wash your hair, wash your hair first  as usual with your normal shampoo. After you shampoo, rinse your hair and body thoroughly to remove the shampoo.  Then [employer] and genitals (private parts) with your normal soap and rinse thoroughly to remove soap.  After that Use CHG Soap as you would any other liquid soap. You can apply CHG directly to the skin and wash gently with a scrungie or a clean washcloth.   Apply the CHG Soap to your body ONLY FROM THE NECK DOWN.  Do not use on open wounds or open sores. Avoid contact with your eyes, ears, mouth and genitals (private parts). Wash Face and genitals (private parts)  with your normal soap.   Wash thoroughly, paying special attention to the area where your surgery will  be performed.  Thoroughly rinse your body with warm water from the neck down.  DO NOT shower/wash with your normal soap after using and rinsing off the CHG Soap.  Pat yourself dry with a CLEAN TOWEL.  Wear CLEAN PAJAMAS to bed the night before surgery  Place CLEAN SHEETS on your bed the night before your surgery  DO NOT SLEEP WITH PETS.   Day of Surgery:  Take a shower with CHG soap. Wear Clean/Comfortable clothing the morning of surgery Do not apply any deodorants/lotions.   Remember to brush your teeth WITH YOUR REGULAR TOOTHPASTE.   Please read over the following fact sheets that you were given.

## 2021-01-11 ENCOUNTER — Encounter (HOSPITAL_COMMUNITY): Payer: Self-pay

## 2021-01-11 ENCOUNTER — Other Ambulatory Visit: Payer: Self-pay

## 2021-01-11 ENCOUNTER — Encounter (HOSPITAL_COMMUNITY)
Admission: RE | Admit: 2021-01-11 | Discharge: 2021-01-11 | Disposition: A | Discharge status: Home / Self Care | Payer: Medicare HMO | Source: Ambulatory Visit | Attending: Neurosurgery | Admitting: Neurosurgery

## 2021-01-11 VITALS — BP 146/87 | HR 69 | Temp 98.3°F | Resp 17 | Ht 65.0 in | Wt 133.7 lb

## 2021-01-11 DIAGNOSIS — Z01812 Encounter for preprocedural laboratory examination: Secondary | ICD-10-CM | POA: Diagnosis present

## 2021-01-11 DIAGNOSIS — G4733 Obstructive sleep apnea (adult) (pediatric): Secondary | ICD-10-CM | POA: Insufficient documentation

## 2021-01-11 DIAGNOSIS — Z01818 Encounter for other preprocedural examination: Secondary | ICD-10-CM

## 2021-01-11 DIAGNOSIS — Z20822 Contact with and (suspected) exposure to covid-19: Secondary | ICD-10-CM | POA: Insufficient documentation

## 2021-01-11 LAB — CBC WITH DIFFERENTIAL/PLATELET
Abs Immature Granulocytes: 0.02 10*3/uL (ref 0.00–0.07)
Basophils Absolute: 0 10*3/uL (ref 0.0–0.1)
Basophils Relative: 1 %
Eosinophils Absolute: 0.2 10*3/uL (ref 0.0–0.5)
Eosinophils Relative: 3 %
HCT: 37.9 % (ref 36.0–46.0)
Hemoglobin: 13.2 g/dL (ref 12.0–15.0)
Immature Granulocytes: 0 %
Lymphocytes Relative: 20 %
Lymphs Abs: 1.1 10*3/uL (ref 0.7–4.0)
MCH: 33.4 pg (ref 26.0–34.0)
MCHC: 34.8 g/dL (ref 30.0–36.0)
MCV: 95.9 fL (ref 80.0–100.0)
Monocytes Absolute: 0.5 10*3/uL (ref 0.1–1.0)
Monocytes Relative: 9 %
Neutro Abs: 3.7 10*3/uL (ref 1.7–7.7)
Neutrophils Relative %: 67 %
Platelets: 205 10*3/uL (ref 150–400)
RBC: 3.95 MIL/uL (ref 3.87–5.11)
RDW: 12.9 % (ref 11.5–15.5)
WBC: 5.5 10*3/uL (ref 4.0–10.5)
nRBC: 0 % (ref 0.0–0.2)

## 2021-01-11 LAB — BASIC METABOLIC PANEL
Anion gap: 9 (ref 5–15)
BUN: 8 mg/dL (ref 8–23)
CO2: 28 mmol/L (ref 22–32)
Calcium: 9.5 mg/dL (ref 8.9–10.3)
Chloride: 99 mmol/L (ref 98–111)
Creatinine, Ser: 0.65 mg/dL (ref 0.44–1.00)
GFR, Estimated: >60 mL/min (ref >60)
Glucose, Bld: 106 mg/dL — ABNORMAL HIGH (ref 70–99)
Potassium: 3.8 mmol/L (ref 3.5–5.1)
Sodium: 136 mmol/L (ref 135–145)

## 2021-01-11 LAB — SARS CORONAVIRUS 2 (TAT 6-24 HRS): SARS Coronavirus 2: NEGATIVE

## 2021-01-11 LAB — SURGICAL PCR SCREEN
MRSA, PCR: NEGATIVE
Staphylococcus aureus: NEGATIVE

## 2021-01-11 NOTE — Progress Notes (Signed)
PCP: Cyndi Bender, PA-C Cardiologist: Glenetta Hew, MD  EKG: 05/17/20 CXR: na ECHO: 2018 Stress Test: 2016/2022 Cardiac Cath: 2008  Fasting Blood Sugar- na Checks Blood Sugar__na_ times a day  ASA: Last dose 01/08/21 Blood Thinner: No  OSA: Yes, does not wear cpap  Covid test 01/11/21 at PAT  Anesthesia Review: Yes, cardiac histroy  Patient denies shortness of breath, fever, cough, and chest pain at PAT appointment.  Patient verbalized understanding of instructions provided today at the PAT appointment.  Patient asked to review instructions at home and day of surgery.

## 2021-01-12 NOTE — Progress Notes (Signed)
Anesthesia Chart Review:  Patient evaluated by cardiologist Dr. Ellyn Hack 05/17/2020 for report of DOE.  Per note, "She had nonobstructive CAD the past on cardiac catheterization.  Negative Myoview in 2018.  Now still having exertional dyspnea.  Symptoms not consistent with what would be angina, but are still somewhat concerning.  May be related to pulmonary etiology, or more likely simply deconditioning. Plan: Evaluate with CPX."  Cardiopulmonary exercise test was reassuring, showed normal/excellent functional capacity compared to matched norms and no indication of cardiopulmonary abnormality.  OSA, intolerant to CPAP.  Preop labs reviewed, unremarkable.  EKG 05/17/2020: NSR.  Rate 75.  Left anterior fascicular block.  Possible anterolateral infarct, age undetermined.  Cardiopulmonary exercise test 06/15/2020: Conclusion: Exercise testing with gas exchange demonstrates normal (actually excellent) functional capacity when compared to matched sedentary norms. There is no indication for cardiopulmonary abnormality.   Nuclear stress 06/23/2016: Nuclear stress EF: 69%. Normal perfusion This is a low risk study.   TTE 06/19/2016: - Left ventricle: The cavity size was normal. Wall thickness was    increased in a pattern of mild LVH. Systolic function was normal.    The estimated ejection fraction was in the range of 55% to 60%.    Wall motion was normal; there were no regional wall motion    abnormalities. Doppler parameters are consistent with abnormal    left ventricular relaxation (grade 1 diastolic dysfunction).  - Aortic valve: There was trivial regurgitation.   Impressions:   - Normal LV systolic function; mild diastolic dysfunction; mild    LVH; trace AI, MR and TR.      Wynonia Musty [facility] Short Stay Center/Anesthesiology Phone [phone] 01/12/2021 4:21 PM

## 2021-01-12 NOTE — Anesthesia Preprocedure Evaluation (Addendum)
Anesthesia Evaluation  Patient identified by MRN, date of birth, ID band Patient awake    Reviewed: Allergy & Precautions, NPO status , Patient's Chart, lab work & pertinent test results  History of Anesthesia Complications Negative for: history of anesthetic complications  Airway Mallampati: III  TM Distance: <3 FB Neck ROM: Full    Dental  (+) Dental Advisory Given, Teeth Intact   Pulmonary sleep apnea ,    Pulmonary exam normal        Cardiovascular hypertension, Pt. on medications + CAD  Normal cardiovascular exam   '22 Exercise Stress - Exercise testing with gas exchange demonstrates normal (actually excellent) functional capacity when compared to matched sedentary norms. There is no indication for cardiopulmonary abnormality.     Neuro/Psych negative neurological ROS  negative psych ROS   GI/Hepatic negative GI ROS, Neg liver ROS,   Endo/Other  negative endocrine ROS  Renal/GU negative Renal ROS  Female GU complaint     Musculoskeletal  (+) Arthritis ,   Abdominal   Peds  Hematology negative hematology ROS (+)   Anesthesia Other Findings   Reproductive/Obstetrics                           Anesthesia Physical Anesthesia Plan  ASA: 3  Anesthesia Plan: General   Post-op Pain Management:    Induction: Intravenous  PONV Risk Score and Plan: 4 or greater and Treatment may vary due to age or medical condition, Ondansetron and Propofol infusion  Airway Management Planned: Oral ETT  Additional Equipment: None  Intra-op Plan:   Post-operative Plan: Extubation in OR  Informed Consent: I have reviewed the patients History and Physical, chart, labs and discussed the procedure including the risks, benefits and alternatives for the proposed anesthesia with the patient or authorized representative who has indicated his/her understanding and acceptance.     Dental advisory  given  Plan Discussed with: CRNA and Anesthesiologist  Anesthesia Plan Comments:       Anesthesia Quick Evaluation

## 2021-01-14 ENCOUNTER — Encounter (HOSPITAL_COMMUNITY)
Admission: RE | Disposition: A | Discharge status: Home / Self Care | Payer: Self-pay | Source: Ambulatory Visit | Attending: Neurosurgery

## 2021-01-14 ENCOUNTER — Observation Stay (HOSPITAL_COMMUNITY)
Admission: RE | Admit: 2021-01-14 | Discharge: 2021-01-14 | Disposition: A | Discharge status: Home / Self Care | Payer: Medicare HMO | Source: Ambulatory Visit | Attending: Neurosurgery | Admitting: Neurosurgery

## 2021-01-14 ENCOUNTER — Ambulatory Visit (HOSPITAL_COMMUNITY): Payer: Medicare HMO | Admitting: Anesthesiology

## 2021-01-14 ENCOUNTER — Ambulatory Visit (HOSPITAL_COMMUNITY): Payer: Medicare HMO

## 2021-01-14 ENCOUNTER — Ambulatory Visit (HOSPITAL_COMMUNITY): Payer: Medicare HMO | Admitting: Physician Assistant

## 2021-01-14 DIAGNOSIS — M48062 Spinal stenosis, lumbar region with neurogenic claudication: Principal | ICD-10-CM | POA: Insufficient documentation

## 2021-01-14 DIAGNOSIS — G4733 Obstructive sleep apnea (adult) (pediatric): Secondary | ICD-10-CM | POA: Diagnosis not present

## 2021-01-14 DIAGNOSIS — Z4789 Encounter for other orthopedic aftercare: Secondary | ICD-10-CM | POA: Diagnosis not present

## 2021-01-14 DIAGNOSIS — M5416 Radiculopathy, lumbar region: Secondary | ICD-10-CM | POA: Diagnosis not present

## 2021-01-14 DIAGNOSIS — M48061 Spinal stenosis, lumbar region without neurogenic claudication: Secondary | ICD-10-CM | POA: Diagnosis not present

## 2021-01-14 DIAGNOSIS — Z7902 Long term (current) use of antithrombotics/antiplatelets: Secondary | ICD-10-CM | POA: Insufficient documentation

## 2021-01-14 DIAGNOSIS — Z419 Encounter for procedure for purposes other than remedying health state, unspecified: Secondary | ICD-10-CM

## 2021-01-14 DIAGNOSIS — E7800 Pure hypercholesterolemia, unspecified: Secondary | ICD-10-CM | POA: Diagnosis not present

## 2021-01-14 HISTORY — PX: LUMBAR LAMINECTOMY/DECOMPRESSION MICRODISCECTOMY: SHX5026

## 2021-01-14 SURGERY — LUMBAR LAMINECTOMY/DECOMPRESSION MICRODISCECTOMY 1 LEVEL
Anesthesia: General | Site: Back | Laterality: Left

## 2021-01-14 MED ORDER — PROPOFOL 10 MG/ML IV BOLUS
INTRAVENOUS | Status: AC
  Filled 2021-01-14: qty 20

## 2021-01-14 MED ORDER — HYDROMORPHONE HCL 1 MG/ML IJ SOLN: 1.0000 mg | INTRAMUSCULAR | Status: DC | PRN

## 2021-01-14 MED ORDER — CHLORHEXIDINE GLUCONATE CLOTH 2 % EX PADS: 6.0000 | MEDICATED_PAD | Freq: Once | CUTANEOUS | Status: DC

## 2021-01-14 MED ORDER — PHENYLEPHRINE HCL-NACL 20-0.9 MG/250ML-% IV SOLN
INTRAVENOUS | Status: DC | PRN
  Administered 2021-01-14: 30 ug/min via INTRAVENOUS

## 2021-01-14 MED ORDER — SODIUM CHLORIDE 0.9% FLUSH
3.0000 mL | Freq: Two times a day (BID) | INTRAVENOUS | Status: DC
  Administered 2021-01-14: 3 mL via INTRAVENOUS

## 2021-01-14 MED ORDER — FENTANYL CITRATE (PF) 250 MCG/5ML IJ SOLN
INTRAMUSCULAR | Status: AC
  Filled 2021-01-14: qty 5

## 2021-01-14 MED ORDER — FENTANYL CITRATE (PF) 250 MCG/5ML IJ SOLN
INTRAMUSCULAR | Status: DC | PRN
  Administered 2021-01-14 (×3): 25 ug via INTRAVENOUS
  Administered 2021-01-14: 75 ug via INTRAVENOUS

## 2021-01-14 MED ORDER — HYDROCHLOROTHIAZIDE 25 MG PO TABS
25.0000 mg | ORAL_TABLET | Freq: Every day | ORAL | Status: DC
  Administered 2021-01-14: 25 mg via ORAL
  Filled 2021-01-14: qty 1

## 2021-01-14 MED ORDER — EPHEDRINE SULFATE-NACL 50-0.9 MG/10ML-% IV SOSY
PREFILLED_SYRINGE | INTRAVENOUS | Status: DC | PRN
  Administered 2021-01-14: 5 mg via INTRAVENOUS

## 2021-01-14 MED ORDER — ONDANSETRON HCL 4 MG PO TABS: 4.0000 mg | ORAL_TABLET | Freq: Four times a day (QID) | ORAL | Status: DC | PRN

## 2021-01-14 MED ORDER — VANCOMYCIN HCL IN DEXTROSE 1-5 GM/200ML-% IV SOLN
INTRAVENOUS | Status: AC
  Administered 2021-01-14: 1000 mg via INTRAVENOUS
  Filled 2021-01-14: qty 200

## 2021-01-14 MED ORDER — MENTHOL 3 MG MT LOZG: 1.0000 | LOZENGE | OROMUCOSAL | Status: DC | PRN

## 2021-01-14 MED ORDER — ROCURONIUM BROMIDE 10 MG/ML (PF) SYRINGE
PREFILLED_SYRINGE | INTRAVENOUS | Status: DC | PRN
  Administered 2021-01-14: 70 mg via INTRAVENOUS
  Administered 2021-01-14 (×2): 10 mg via INTRAVENOUS

## 2021-01-14 MED ORDER — VITAMIN D 25 MCG (1000 UNIT) PO TABS: 1000.0000 [IU] | ORAL_TABLET | Freq: Every day | ORAL | Status: DC

## 2021-01-14 MED ORDER — THROMBIN 5000 UNITS EX SOLR
CUTANEOUS | Status: DC | PRN
  Administered 2021-01-14 (×2): 5000 [IU] via TOPICAL

## 2021-01-14 MED ORDER — EPHEDRINE 5 MG/ML INJ
INTRAVENOUS | Status: AC
  Filled 2021-01-14: qty 5

## 2021-01-14 MED ORDER — HYDROCODONE-ACETAMINOPHEN 10-325 MG PO TABS: 1.0000 | ORAL_TABLET | ORAL | Status: DC | PRN

## 2021-01-14 MED ORDER — ONDANSETRON HCL 4 MG/2ML IJ SOLN
INTRAMUSCULAR | Status: DC | PRN
  Administered 2021-01-14: 4 mg via INTRAVENOUS

## 2021-01-14 MED ORDER — HEMOSTATIC AGENTS (NO CHARGE) OPTIME
TOPICAL | Status: DC | PRN
  Administered 2021-01-14: 1 via TOPICAL

## 2021-01-14 MED ORDER — PROPOFOL 10 MG/ML IV BOLUS
INTRAVENOUS | Status: DC | PRN
  Administered 2021-01-14: 140 mg via INTRAVENOUS

## 2021-01-14 MED ORDER — CHLORHEXIDINE GLUCONATE 0.12 % MT SOLN
15.0000 mL | Freq: Once | OROMUCOSAL | Status: AC
  Administered 2021-01-14: 15 mL via OROMUCOSAL
  Filled 2021-01-14: qty 15

## 2021-01-14 MED ORDER — BUPIVACAINE HCL (PF) 0.25 % IJ SOLN
INTRAMUSCULAR | Status: AC
  Filled 2021-01-14: qty 30

## 2021-01-14 MED ORDER — KETOROLAC TROMETHAMINE 15 MG/ML IJ SOLN
INTRAMUSCULAR | Status: DC | PRN
  Administered 2021-01-14: 15 mg via INTRAVENOUS

## 2021-01-14 MED ORDER — DEXAMETHASONE SODIUM PHOSPHATE 10 MG/ML IJ SOLN
INTRAMUSCULAR | Status: AC
  Filled 2021-01-14: qty 1

## 2021-01-14 MED ORDER — KETOROLAC TROMETHAMINE 30 MG/ML IJ SOLN
INTRAMUSCULAR | Status: AC
  Filled 2021-01-14: qty 1

## 2021-01-14 MED ORDER — THROMBIN 5000 UNITS EX SOLR
CUTANEOUS | Status: AC
  Filled 2021-01-14: qty 10000

## 2021-01-14 MED ORDER — ACETAMINOPHEN 325 MG PO TABS: 650.0000 mg | ORAL_TABLET | ORAL | Status: DC | PRN

## 2021-01-14 MED ORDER — CYCLOBENZAPRINE HCL 10 MG PO TABS: 10.0000 mg | ORAL_TABLET | Freq: Three times a day (TID) | ORAL | Status: DC | PRN

## 2021-01-14 MED ORDER — LIDOCAINE 2% (20 MG/ML) 5 ML SYRINGE
INTRAMUSCULAR | Status: AC
  Filled 2021-01-14: qty 5

## 2021-01-14 MED ORDER — BUPIVACAINE HCL (PF) 0.25 % IJ SOLN
INTRAMUSCULAR | Status: DC | PRN
  Administered 2021-01-14: 20 mL

## 2021-01-14 MED ORDER — ROCURONIUM BROMIDE 10 MG/ML (PF) SYRINGE
PREFILLED_SYRINGE | INTRAVENOUS | Status: AC
  Filled 2021-01-14: qty 10

## 2021-01-14 MED ORDER — ONDANSETRON HCL 4 MG/2ML IJ SOLN
INTRAMUSCULAR | Status: AC
  Filled 2021-01-14: qty 2

## 2021-01-14 MED ORDER — DEXAMETHASONE SODIUM PHOSPHATE 10 MG/ML IJ SOLN
10.0000 mg | Freq: Once | INTRAMUSCULAR | Status: AC
  Administered 2021-01-14: 10 mg via INTRAVENOUS

## 2021-01-14 MED ORDER — LOSARTAN POTASSIUM 50 MG PO TABS
50.0000 mg | ORAL_TABLET | Freq: Every day | ORAL | Status: DC
  Administered 2021-01-14: 50 mg via ORAL
  Filled 2021-01-14: qty 1

## 2021-01-14 MED ORDER — OXYCODONE HCL 5 MG/5ML PO SOLN: 5.0000 mg | Freq: Once | ORAL | Status: DC | PRN

## 2021-01-14 MED ORDER — HYDROCODONE-ACETAMINOPHEN 5-325 MG PO TABS
1.0000 | ORAL_TABLET | ORAL | Status: DC | PRN
  Administered 2021-01-14: 1 via ORAL
  Filled 2021-01-14: qty 1

## 2021-01-14 MED ORDER — KETOROLAC TROMETHAMINE 15 MG/ML IJ SOLN
7.5000 mg | Freq: Four times a day (QID) | INTRAMUSCULAR | Status: DC
  Administered 2021-01-14: 7.5 mg via INTRAVENOUS
  Filled 2021-01-14: qty 1

## 2021-01-14 MED ORDER — PHENOL 1.4 % MT LIQD: 1.0000 | OROMUCOSAL | Status: DC | PRN

## 2021-01-14 MED ORDER — VANCOMYCIN HCL IN DEXTROSE 1-5 GM/200ML-% IV SOLN: 1000.0000 mg | INTRAVENOUS | Status: AC

## 2021-01-14 MED ORDER — PHENYLEPHRINE 40 MCG/ML (10ML) SYRINGE FOR IV PUSH (FOR BLOOD PRESSURE SUPPORT)
PREFILLED_SYRINGE | INTRAVENOUS | Status: DC | PRN
  Administered 2021-01-14: 120 ug via INTRAVENOUS
  Administered 2021-01-14: 80 ug via INTRAVENOUS

## 2021-01-14 MED ORDER — ONDANSETRON HCL 4 MG/2ML IJ SOLN: 4.0000 mg | Freq: Four times a day (QID) | INTRAMUSCULAR | Status: DC | PRN

## 2021-01-14 MED ORDER — 0.9 % SODIUM CHLORIDE (POUR BTL) OPTIME
TOPICAL | Status: DC | PRN
  Administered 2021-01-14: 1000 mL

## 2021-01-14 MED ORDER — OXYCODONE HCL 5 MG PO TABS: 5.0000 mg | ORAL_TABLET | Freq: Once | ORAL | Status: DC | PRN

## 2021-01-14 MED ORDER — VANCOMYCIN HCL 500 MG/100ML IV SOLN
500.0000 mg | Freq: Once | INTRAVENOUS | Status: DC
  Filled 2021-01-14: qty 100

## 2021-01-14 MED ORDER — SODIUM CHLORIDE 0.9% FLUSH: 3.0000 mL | INTRAVENOUS | Status: DC | PRN

## 2021-01-14 MED ORDER — ACETAMINOPHEN 650 MG RE SUPP: 650.0000 mg | RECTAL | Status: DC | PRN

## 2021-01-14 MED ORDER — ASCORBIC ACID 500 MG PO TABS
500.0000 mg | ORAL_TABLET | Freq: Every day | ORAL | Status: DC
  Administered 2021-01-14: 500 mg via ORAL
  Filled 2021-01-14: qty 1

## 2021-01-14 MED ORDER — FENTANYL CITRATE (PF) 100 MCG/2ML IJ SOLN: 25.0000 ug | INTRAMUSCULAR | Status: DC | PRN

## 2021-01-14 MED ORDER — SUGAMMADEX SODIUM 200 MG/2ML IV SOLN
INTRAVENOUS | Status: DC | PRN
  Administered 2021-01-14: 100 mg via INTRAVENOUS
  Administered 2021-01-14: 200 mg via INTRAVENOUS

## 2021-01-14 MED ORDER — SODIUM CHLORIDE 0.9 % IV SOLN
250.0000 mL | INTRAVENOUS | Status: DC
  Administered 2021-01-14: 250 mL via INTRAVENOUS

## 2021-01-14 MED ORDER — ONDANSETRON HCL 4 MG/2ML IJ SOLN: INTRAMUSCULAR | Status: DC | PRN

## 2021-01-14 MED ORDER — LIDOCAINE 2% (20 MG/ML) 5 ML SYRINGE
INTRAMUSCULAR | Status: DC | PRN
  Administered 2021-01-14: 80 mg via INTRAVENOUS

## 2021-01-14 MED ORDER — VITAMIN B-12 1000 MCG PO TABS: 500.0000 ug | ORAL_TABLET | Freq: Every day | ORAL | Status: DC

## 2021-01-14 MED ORDER — ORAL CARE MOUTH RINSE: 15.0000 mL | Freq: Once | OROMUCOSAL | Status: AC

## 2021-01-14 MED ORDER — PHENYLEPHRINE 40 MCG/ML (10ML) SYRINGE FOR IV PUSH (FOR BLOOD PRESSURE SUPPORT)
PREFILLED_SYRINGE | INTRAVENOUS | Status: AC
  Filled 2021-01-14: qty 10

## 2021-01-14 MED ORDER — ACETAMINOPHEN 500 MG PO TABS
1000.0000 mg | ORAL_TABLET | Freq: Once | ORAL | Status: AC
  Administered 2021-01-14: 1000 mg via ORAL
  Filled 2021-01-14: qty 2

## 2021-01-14 MED ORDER — PREGABALIN 75 MG PO CAPS
75.0000 mg | ORAL_CAPSULE | Freq: Three times a day (TID) | ORAL | Status: DC
  Administered 2021-01-14: 75 mg via ORAL
  Filled 2021-01-14: qty 1

## 2021-01-14 MED ORDER — ONDANSETRON HCL 4 MG/2ML IJ SOLN: 4.0000 mg | Freq: Once | INTRAMUSCULAR | Status: DC | PRN

## 2021-01-14 MED ORDER — AMITRIPTYLINE HCL 10 MG PO TABS
10.0000 mg | ORAL_TABLET | Freq: Every day | ORAL | Status: DC
  Filled 2021-01-14: qty 1

## 2021-01-14 MED ORDER — LACTATED RINGERS IV SOLN: INTRAVENOUS | Status: DC

## 2021-01-14 SURGICAL SUPPLY — 48 items
BAG COUNTER SPONGE SURGICOUNT (BAG) ×2 IMPLANT
BAG DECANTER FOR FLEXI CONT (MISCELLANEOUS) ×2 IMPLANT
BAND RUBBER #18 3X1/16 STRL (MISCELLANEOUS) ×4 IMPLANT
BENZOIN TINCTURE PRP APPL 2/3 (GAUZE/BANDAGES/DRESSINGS) ×2 IMPLANT
BLADE CLIPPER SURG (BLADE) IMPLANT
BUR CUTTER 7.0 ROUND (BURR) ×2 IMPLANT
CANISTER SUCT 3000ML PPV (MISCELLANEOUS) ×2 IMPLANT
CARTRIDGE OIL MAESTRO DRILL (MISCELLANEOUS) ×1 IMPLANT
CLSR STERI-STRIP ANTIMIC 1/2X4 (GAUZE/BANDAGES/DRESSINGS) ×2 IMPLANT
DECANTER SPIKE VIAL GLASS SM (MISCELLANEOUS) IMPLANT
DERMABOND ADVANCED (GAUZE/BANDAGES/DRESSINGS) ×1
DERMABOND ADVANCED .7 DNX12 (GAUZE/BANDAGES/DRESSINGS) ×1 IMPLANT
DIFFUSER DRILL AIR PNEUMATIC (MISCELLANEOUS) ×2 IMPLANT
DRAPE HALF SHEET 40X57 (DRAPES) IMPLANT
DRAPE LAPAROTOMY 100X72X124 (DRAPES) ×2 IMPLANT
DRAPE MICROSCOPE LEICA (MISCELLANEOUS) ×2 IMPLANT
DRAPE SURG 17X23 STRL (DRAPES) ×4 IMPLANT
DRSG OPSITE POSTOP 4X6 (GAUZE/BANDAGES/DRESSINGS) ×2 IMPLANT
DURAPREP 26ML APPLICATOR (WOUND CARE) ×2 IMPLANT
ELECT REM PT RETURN 9FT ADLT (ELECTROSURGICAL) ×2
ELECTRODE REM PT RTRN 9FT ADLT (ELECTROSURGICAL) ×1 IMPLANT
GAUZE 4X4 16PLY ~~LOC~~+RFID DBL (SPONGE) ×2 IMPLANT
GAUZE SPONGE 4X4 12PLY STRL (GAUZE/BANDAGES/DRESSINGS) ×2 IMPLANT
GLOVE EXAM NITRILE XL STR (GLOVE) IMPLANT
GLOVE SURG ENC MOIS LTX SZ6.5 (GLOVE) ×4 IMPLANT
GLOVE SURG LTX SZ9 (GLOVE) ×2 IMPLANT
GLOVE SURG UNDER POLY LF SZ6.5 (GLOVE) ×4 IMPLANT
GOWN STRL REUS W/ TWL LRG LVL3 (GOWN DISPOSABLE) ×2 IMPLANT
GOWN STRL REUS W/ TWL XL LVL3 (GOWN DISPOSABLE) ×2 IMPLANT
GOWN STRL REUS W/TWL 2XL LVL3 (GOWN DISPOSABLE) IMPLANT
GOWN STRL REUS W/TWL LRG LVL3 (GOWN DISPOSABLE) ×4
GOWN STRL REUS W/TWL XL LVL3 (GOWN DISPOSABLE) ×4
KIT BASIN OR (CUSTOM PROCEDURE TRAY) ×2 IMPLANT
KIT TURNOVER KIT B (KITS) ×2 IMPLANT
NEEDLE HYPO 22GX1.5 SAFETY (NEEDLE) ×2 IMPLANT
NEEDLE SPNL 22GX3.5 QUINCKE BK (NEEDLE) IMPLANT
NS IRRIG 1000ML POUR BTL (IV SOLUTION) ×2 IMPLANT
OIL CARTRIDGE MAESTRO DRILL (MISCELLANEOUS) ×2
PACK LAMINECTOMY NEURO (CUSTOM PROCEDURE TRAY) ×2 IMPLANT
PAD ARMBOARD 7.5X6 YLW CONV (MISCELLANEOUS) ×6 IMPLANT
SPONGE SURGIFOAM ABS GEL SZ50 (HEMOSTASIS) ×2 IMPLANT
SPONGE T-LAP 4X18 ~~LOC~~+RFID (SPONGE) ×4 IMPLANT
STRIP CLOSURE SKIN 1/2X4 (GAUZE/BANDAGES/DRESSINGS) ×2 IMPLANT
SUT VIC AB 2-0 CT1 18 (SUTURE) ×2 IMPLANT
SUT VIC AB 3-0 SH 8-18 (SUTURE) ×2 IMPLANT
TOWEL GREEN STERILE (TOWEL DISPOSABLE) ×2 IMPLANT
TOWEL GREEN STERILE FF (TOWEL DISPOSABLE) ×2 IMPLANT
WATER STERILE IRR 1000ML POUR (IV SOLUTION) ×2 IMPLANT

## 2021-01-14 NOTE — Brief Op Note (Signed)
01/14/2021  10:14 AM  PATIENT:  Belinda Brown  86 y.o. female  PRE-OPERATIVE DIAGNOSIS:  Stenosis  POST-OPERATIVE DIAGNOSIS:  Stenosis  PROCEDURE:  Procedure(s): Laminectomy and Foraminotomy - left - - Lumbar two-Lumbar three (Left)  SURGEON:  Surgeon(s) and Role:    Earnie Larsson, MD - Primary  PHYSICIAN ASSISTANT:   ASSISTANTSMearl Latin   ANESTHESIA:   general  EBL:  50 mL   BLOOD ADMINISTERED:none  DRAINS: none   LOCAL MEDICATIONS USED:  MARCAINE     SPECIMEN:  No Specimen  DISPOSITION OF SPECIMEN:  PATHOLOGY  COUNTS:  YES  TOURNIQUET:  * No tourniquets in log *  DICTATION: .Dragon Dictation  PLAN OF CARE: Admit for overnight observation  PATIENT DISPOSITION:  PACU - hemodynamically stable.   Delay start of Pharmacological VTE agent (>24hrs) due to surgical blood loss or risk of bleeding: yes

## 2021-01-14 NOTE — Op Note (Signed)
Date of procedure: 01/14/2021  Date of dictation: Same  Service: Neurosurgery  Preoperative diagnosis: Left L2-3 stenosis with radiculopathy  Postoperative diagnosis: Same  Procedure Name: Left L2-3 decompressive laminotomy with foraminotomy, left L2-3 microdiscectomy  Surgeon:Treina Arscott A.Mayerli Kirst, M.D.  Asst. Surgeon: Reinaldo Meeker, NP  Anesthesia: General  Indication: 86 year old female with severe left lower extremity pain paresthesias and weakness failing conservative manage..  Patient status post prior L3-L5 decompression and fusion.  Patient now with stenosis and a possible disc herniation on the left at L2-3 with severe compression of thecal sac and nerve roots.  Patient presents now for decompressive surgery in hopes of improving her symptoms.  Operative note: After induction anesthesia, patient position prone on the Wilson frame and properly padded.  Lumbar region prepped and draped sterilely.  Incision made overlying L1-3.  Dissection performed in the left.  Retractor placed.  X-ray taken.  Level confirmed.  Laminotomies then performed using high-speed drill and Kerrison rongeurs.  Ligament flavum elevated and resected.  Foraminotomies completed on the course exiting L2 and L3 nerve roots on the left side.  Microscope brought in field used microdissection spinal canal.  Thecal sac and left L3 nerve root gently mobilized and retract towards midline.  The disc space itself which was torn and herniated and free disc herniation was dissected free and removed.  The disc space itself was incised.  The space was entered and all loose or obviously degenerative disc material was removed in the interspace.  At this point a very thorough decompression of been achieved.  There was no evidence of injury to the thecal sac or nerve roots.  Wound is then irrigated with antibiotic solution.  Gelfoam was placed topically for hemostasis.  Wounds then closed in layers with Vicryl sutures.  Steri-Strips and sterile  dressing were applied.  No apparent complications.  Patient tolerated the procedure well and returns recovery room postop.

## 2021-01-14 NOTE — Progress Notes (Signed)
Pharmacy Antibiotic Note  Belinda Brown is a 86 y.o. female admitted on 01/14/2021  for lumbar surgery .  Pharmacy has been consulted for vancomycin dosing for surgical prophylaxis. No drain placed. Pre-op vancomycin 1g IV dose given this AM at 0808. SCr 0.65 pre-op (11/29).  Plan: Give vancomycin 500mg  IV x 1 at 2000 (12hrs post-op x 1 dose per protocol with no drain placed) Monitor SCr Pharmacy will sign off consult and follow peripherally  Height: 5\' 5"  (165.1 cm) Weight: 61.2 kg (135 lb) IBW/kg (Calculated) : 57  Temp (24hrs), Avg:97.6 F (36.4 C), Min:97 F (36.1 C), Max:98.7 F (37.1 C)  Recent Labs  Lab 01/11/21 1119  WBC 5.5  CREATININE 0.65    Estimated Creatinine Clearance: 47.1 mL/min (by C-G formula based on SCr of 0.65 mg/dL).    Allergies  Allergen Reactions   Ampicillin Hives    Has patient had a PCN reaction causing immediate rash, facial/tongue/throat swelling, SOB or lightheadedness with hypotension: Yes Has patient had a PCN reaction causing severe rash involving mucus membranes or skin necrosis: No Has patient had a PCN reaction that required hospitalization: No Has patient had a PCN reaction occurring within the last 10 years: No If all of the above answers are "NO", then may proceed with Cephalosporin use.     Arturo Morton, PharmD, BCPS Please check AMION for all [place] contact numbers Clinical Pharmacist 01/14/2021 1:29 PM

## 2021-01-14 NOTE — Anesthesia Procedure Notes (Addendum)
Procedure Name: Intubation Date/Time: 01/14/2021 8:39 AM Performed by: Audry Pili, MD Pre-anesthesia Checklist: Patient identified, Emergency Drugs available, Suction available and Patient being monitored Patient Re-evaluated:Patient Re-evaluated prior to induction Oxygen Delivery Method: Circle system utilized Preoxygenation: Pre-oxygenation with 100% oxygen Induction Type: IV induction Ventilation: Mask ventilation without difficulty Laryngoscope Size: Miller and 3 Grade View: Grade II Tube type: Oral Tube size: 7.0 mm Number of attempts: 2 Airway Equipment and Method: Stylet and Oral airway Placement Confirmation: ETT inserted through vocal cords under direct vision, positive ETCO2 and breath sounds checked- equal and bilateral Secured at: 21 cm Tube secured with: Tape Dental Injury: Teeth and Oropharynx as per pre-operative assessment  Difficulty Due To: Difficulty was anticipated and Difficult Airway- due to anterior larynx Comments: First unsuccessful attempt by EMT student. Attempt by Dr. Fransisco Beau, Grade 2B view with Mil 3, passed ETT atraumatically.

## 2021-01-14 NOTE — Evaluation (Signed)
Occupational Therapy Evaluation Patient Details Name: Belinda Brown MRN: [id number] DOB: Apr 25, 1934 Today's Date: 01/14/2021   History of Present Illness The pt is an 86 yo female presenting 12/2 for L2-3 decompression, laminotomy, and foraminotomy due to chronic radicular pain. PMH includes: L3-4 decompression and fusion, shingles 09/2020, HTN, OSA, and overactive bladder.   Clinical Impression   PTA patient was living alone in a private residence with several steps to enter and was independent with ADLs/IADLs with use of AD/DME. Patient currently presents near baseline level of function demonstrating observed ADLs with supervision to [place] guard with use of QC. OT provided education on spinal precautions, home set-up to maximize safety and independence with self-care tasks, and acquisition/use of AE. Patient/family expressed verbal understanding. Patient to d/c home later this date. Belinda Brown able to provide near constant supervision/assist. Both patient and family in agreement with HHOT to maximize safety/independence with self-care tasks. OT to sign off at this time.      Recommendations for follow up therapy are one component of a multi-disciplinary discharge planning process, led by the attending physician.  Recommendations may be updated based on patient status, additional functional criteria and insurance authorization.   Follow Up Recommendations  Home health OT    Assistance Recommended at Discharge Intermittent Supervision/Assistance  Functional Status Assessment  Patient has had a recent decline in their functional status and demonstrates the ability to make significant improvements in function in a reasonable and predictable amount of time.  Equipment Recommendations  None recommended by OT    Recommendations for Other Services       Precautions / Restrictions Precautions Precautions: Back Precaution Booklet Issued: Yes (comment) Precaution Comments: Written and verbal education  provided to patient/family. Good adherence during ADLs. Required Braces or Orthoses:  (No brace needed)      Mobility Bed Mobility Overal bed mobility: Needs Assistance Bed Mobility: Rolling;Sidelying to Sit;Sit to Supine Rolling: Supervision Sidelying to sit: Supervision   Sit to supine: Supervision   General bed mobility comments: Cues for log rolling technique. Requires UE to assist LLE to and from EOB. No external assist needed.    Transfers Overall transfer level: Needs assistance Equipment used: Quad cane Transfers: Sit to/from Stand Sit to Stand: Min guard           General transfer comment: Min guard for steadying. Cues for hand placement.      Balance Overall balance assessment: Needs assistance Sitting-balance support: No upper extremity supported;Feet supported Sitting balance-Leahy Scale: Good     Standing balance support: Single extremity supported;No upper extremity supported;Reliant on assistive device for balance Standing balance-Leahy Scale: Fair Standing balance comment: Reliant on at least unilateral UE support with short-distance mobility.                           ADL either performed or assessed with clinical judgement   ADL Overall ADL's : Needs assistance/impaired     Grooming: Supervision/safety;Standing   Upper Body Bathing: Supervision/ safety;Sitting   Lower Body Bathing: Min guard;Sit to/from stand   Upper Body Dressing : Set up;Sitting   Lower Body Dressing: Min guard;Sit to/from stand   Toilet Transfer: Min guard;Grab bars Associate Professor) Toilet Transfer Details (indicate cue type and reason): To standard height commode in bathroom. Toileting- Water quality scientist and Hygiene: Min guard;Sit to/from stand       Functional mobility during ADLs: Min guard;Cane       Vision   Vision Assessment?: No apparent  visual deficits     Perception     Praxis      Pertinent Vitals/Pain Pain Assessment: 0-10 Pain Score: 7   Pain Location: LLE Pain Descriptors / Indicators: Aching;Grimacing;Operative site guarding Pain Intervention(s): Limited activity within patient's tolerance;Monitored during session;Repositioned     Hand Dominance Right   Extremity/Trunk Assessment Upper Extremity Assessment Upper Extremity Assessment: Overall WFL for tasks assessed   Lower Extremity Assessment Lower Extremity Assessment: Defer to PT evaluation (L foot drop x2 months)   Cervical / Trunk Assessment Cervical / Trunk Assessment: Back Surgery   Communication Communication Communication: No difficulties   Cognition Arousal/Alertness: Awake/alert Behavior During Therapy: WFL for tasks assessed/performed Overall Cognitive Status: Within Functional Limits for tasks assessed                                       General Comments  Clean, dry dressing at incision. Belinda Brown present at bedside.    Exercises     Shoulder Instructions      Home Living Family/patient expects to be discharged to:: Private residence Living Arrangements: Alone Available Help at Discharge: Friend(s);Family;Available 24 hours/day Type of Home: House Home Access: Stairs to enter [employer] of Steps: 2   Home Layout: One level     Bathroom Shower/Tub: Teacher, early years/pre: Standard     Home Equipment: [place] (2 wheels);Shower seat;Hand held shower head;Cane - single point          Prior Functioning/Environment Prior Level of Function : Independent/Modified Independent;Driving             Mobility Comments: QC for household and community mobility ADLs Comments: Mod I with ADLs using AD/DME; has not driven in 2 months        OT Problem List: Decreased strength;Decreased range of motion;Impaired balance (sitting and/or standing);Decreased safety awareness;Decreased knowledge of use of DME or AE;Pain      OT Treatment/Interventions:      OT Goals(Current goals can  be found in the care plan section) Acute Rehab OT Goals Patient Stated Goal: To return home today. OT Goal Formulation: With patient  OT Frequency:     Barriers to D/C:            Co-evaluation              AM-PAC OT "6 Clicks" Daily Activity     Outcome Measure Help from another person eating meals?: None Help from another person taking care of personal grooming?: A Little Help from another person toileting, which includes using toliet, bedpan, or urinal?: A Little Help from another person bathing (including washing, rinsing, drying)?: A Little Help from another person to put on and taking off regular upper body clothing?: A Little Help from another person to put on and taking off regular lower body clothing?: A Little 6 Click Score: 19   End of Session Equipment Utilized During Treatment: Other (comment) (QC) Nurse Communication: Mobility status  Activity Tolerance: Patient tolerated treatment well Patient left: in bed;with call bell/phone within reach;with family/visitor present  OT Visit Diagnosis: Unsteadiness on feet (R26.81);Other abnormalities of gait and mobility (R26.89);Muscle weakness (generalized) (M62.81);Pain Pain - Right/Left: Left Pain - part of body: Leg                Time: 1358-1416 OT Time Calculation (min): 18 min Charges:  OT General Charges $OT Visit: 1 Visit  OT Evaluation $OT Eval Moderate Complexity: 1 Mod  Rio Taber H. OTR/L Supplemental OT, Department of rehab services [phone]  Ranell Skibinski R H. 01/14/2021, 2:28 PM

## 2021-01-14 NOTE — Transfer of Care (Signed)
Immediate Anesthesia Transfer of Care Note  Patient: Belinda Brown  Procedure(s) Performed: Laminectomy and Foraminotomy - left - - Lumbar two-Lumbar three (Left: Back)  Patient Location: PACU  Anesthesia Type:General  Level of Consciousness: drowsy  Airway & Oxygen Therapy: Patient Spontanous Breathing and Patient connected to face mask oxygen  Post-op Assessment: Report given to RN and Post -op Vital signs reviewed and stable  Post vital signs: Reviewed and stable  Last Vitals:  Vitals Value Taken Time  BP 138/65 01/14/21 1031  Temp    Pulse 63 01/14/21 1033  Resp 11 01/14/21 1033  SpO2 100 % 01/14/21 1033  Vitals shown include unvalidated device data.  Last Pain:  Vitals:   01/14/21 0711  TempSrc:   PainSc: 7       Patients Stated Pain Goal: 4 (32/35/57 3220)  Complications: No notable events documented.

## 2021-01-14 NOTE — Discharge Summary (Signed)
Physician Discharge Summary  Patient ID: NYA MONDS MRN: [id number] DOB/AGE: 84-Aug-1936 84 y.o.  Admit date: 01/14/2021 Discharge date: 01/14/2021  Admission Diagnoses:  Discharge Diagnoses:  Principal Problem:   Lumbar radiculopathy   Discharged Condition: good  Hospital Course: Patient mated to hospital where she underwent uncomplicated left-sided Y4-0 decompressive surgery for treatment of her severe lumbar radiculopathy postoperative she is doing well.  Her back pain her strength and sensation are all improved.  She is ambulating with minimal assistance.  She is voiding well.  She wishes to go home.  Consults:   Significant Diagnostic Studies:   Treatments:   Discharge Exam: Blood pressure 131/69, pulse 76, temperature 97.6 F (36.4 C), temperature source Oral, resp. rate 16, height 5\' 5"  (1.651 m), weight 61.2 kg, SpO2 98 %. Awake and alert.  Oriented and appropriate.  Motor and sensory function intact aside from some left dorsiflexion weakness which is improved from preop.  Wound clean and dry.  Chest and abdomen benign.  Disposition: Discharge disposition: 01-Home or Self Care       Discharge Instructions     Face-to-face encounter (required for Medicare/Medicaid patients)   Complete by: As directed    I Charlie Pitter certify that this patient is under my care and that I, or a nurse practitioner or physician's assistant working with me, had a face-to-face encounter that meets the physician face-to-face encounter requirements with th is patient on 01/14/2021. The encounter with the patient was in whole, or in part for the following medical condition(s) which is the primary reason for home health care (List medical condition): Lumbar radiculopathy   The encounter with the patient was in whole, or in part, for the following medical condition, which is the primary reason for home health care: Lumbar radiculopathy   I certify that, based on my findings, the following  services are medically necessary home health services: Physical therapy   Reason for Medically Necessary Home Health Services: Therapy- Personnel officer, Public librarian   My clinical findings support the need for the above services: Unable to leave home safely without assistance and/or assistive device   Further, I certify that my clinical findings support that this patient is homebound due to: Pain interferes with ambulation/mobility   Home Health   Complete by: As directed    To provide the following care/treatments:  PT OT        Allergies as of 01/14/2021       Reactions   Ampicillin Hives   Has patient had a PCN reaction causing immediate rash, facial/tongue/throat swelling, SOB or lightheadedness with hypotension: Yes Has patient had a PCN reaction causing severe rash involving mucus membranes or skin necrosis: No Has patient had a PCN reaction that required hospitalization: No Has patient had a PCN reaction occurring within the last 10 years: No If all of the above answers are "NO", then may proceed with Cephalosporin use.        Medication List     TAKE these medications    amitriptyline 10 MG tablet Commonly known as: ELAVIL Take 10 mg by mouth at bedtime.   aspirin EC 81 MG tablet Take 81 mg by mouth daily.   cholecalciferol 25 MCG (1000 UNIT) tablet Commonly known as: VITAMIN D Take 1,000 Units by mouth daily.   hydrochlorothiazide 25 MG tablet Commonly known as: HYDRODIURIL Take 25 mg by mouth daily.   HYDROcodone-acetaminophen 5-325 MG tablet Commonly known as: NORCO/VICODIN Take 2 tablets by mouth  every 4 (four) hours as needed. What changed:  how much to take when to take this reasons to take this   ibuprofen 800 MG tablet Commonly known as: ADVIL Take 800 mg by mouth 3 (three) times daily.   losartan 50 MG tablet Commonly known as: COZAAR Take 50 mg by mouth daily.   OVER THE COUNTER MEDICATION Apply 1 application  topically 2 (two) times daily as needed (pain). [place] Roll On Pain Relief   pregabalin 75 MG capsule Commonly known as: Lyrica Take 1 capsule (75 mg total) by mouth 2 (two) times daily. What changed: when to take this   vitamin B-12 500 MCG tablet Commonly known as: CYANOCOBALAMIN Take 500 mcg by mouth daily.   vitamin C 500 MG tablet Commonly known as: ASCORBIC ACID Take 500 mg by mouth daily.         Signed: Cooper Render Kathelyn Gombos 01/14/2021, 5:25 PM

## 2021-01-14 NOTE — Evaluation (Signed)
Physical Therapy Evaluation Patient Details Name: Belinda Brown MRN: [id number] DOB: [birth year] Today's Date: 01/14/2021  History of Present Illness  The pt is an 86 yo female presenting 12/2 for L2-3 decompression, laminotomy, and foraminotomy due to chronic radicular pain. PMH includes: L3-4 decompression and fusion, shingles 09/2020, HTN, OSA, and overactive bladder.   Clinical Impression  Pt in bed upon arrival of PT, agreeable to evaluation at this time. Prior to admission the pt was mobilizing with quad cane for short distances due to chronic LLE muscle weakness and pain. The pt now presents with limitations in functional mobility, strength and sensation in LLE, activity tolerance, and dynamic stability due to above dx, and will continue to benefit from skilled PT to address these deficits. The pt was able to demo good adherence to log roll technique with verbal cues, and completed sit-stand transfers and hallway mobility with use of quad cane but supervision only for safety. The pt was able to demo safe navigation of 3 stairs with no need for physical assistance. All education regarding progressive walking program, activity modification, and fall risk reduction strategies and the pt and her son voiced understanding. No further acute PT needs, all further PT needs can be met in home setting with HHPT. Pt is safe for d/c home with family when medically cleared.      Recommendations for follow up therapy are one component of a multi-disciplinary discharge planning process, led by the attending physician.  Recommendations may be updated based on patient status, additional functional criteria and insurance authorization.  Follow Up Recommendations Home health PT    Assistance Recommended at Discharge Intermittent Supervision/Assistance  Functional Status Assessment Patient has had a recent decline in their functional status and demonstrates the ability to make significant improvements in function in  a reasonable and predictable amount of time.  Equipment Recommendations  None recommended by PT    Recommendations for Other Services       Precautions / Restrictions Precautions Precautions: Back;Fall (reports 1 fall in past 6 months) Precaution Booklet Issued: Yes (comment) Precaution Comments: Written and verbal education provided to patient/family. Good adherence during ADLs. Required Braces or Orthoses:  (No brace needed) Restrictions Weight Bearing Restrictions: No      Mobility  Bed Mobility Overal bed mobility: Needs Assistance Bed Mobility: Rolling;Sidelying to Sit;Sit to Supine Rolling: Supervision Sidelying to sit: Supervision   Sit to supine: Supervision   General bed mobility comments: Cues for log rolling technique. Requires UE to assist LLE to and from EOB. No external assist needed.    Transfers Overall transfer level: Needs assistance Equipment used: Quad cane Transfers: Sit to/from Stand Sit to Stand: Min guard           General transfer comment: Min guard for steadying. Cues for hand placement.    Ambulation/Gait Ambulation/Gait assistance: Min guard Gait Distance (Feet): 150 Feet Assistive device: Quad cane Gait Pattern/deviations: Step-to pattern;Decreased step length - left;Decreased dorsiflexion - left Gait velocity: decreased Gait velocity interpretation: <1.31 ft/sec, indicative of household ambulator   General Gait Details: pt with multiple gait compensations for LLE weakness, increased hip flexion and circumduction due to foot drop. slow but steady  Stairs Stairs: Yes Stairs assistance: Min guard Stair Management: No rails;With cane;Step to pattern Number of Stairs: 3 General stair comments: steady with RLE leading up and LLE leading down    Balance Overall balance assessment: Needs assistance Sitting-balance support: No upper extremity supported;Feet supported Sitting balance-Leahy Scale: Good  Standing balance support:  Single extremity supported;No upper extremity supported;Reliant on assistive device for balance Standing balance-Leahy Scale: Fair Standing balance comment: Reliant on at least unilateral UE support with short-distance mobility.                             Pertinent Vitals/Pain Pain Assessment: Faces Pain Score: 7  Faces Pain Scale: Hurts little more Pain Location: LLE Pain Descriptors / Indicators: Aching;Grimacing;Operative site guarding Pain Intervention(s): Limited activity within patient's tolerance;Monitored during session;Repositioned    Home Living Family/patient expects to be discharged to:: Private residence Living Arrangements: Alone Available Help at Discharge: Friend(s);Family;Available 24 hours/day Type of Home: House Home Access: Stairs to enter Entrance Stairs-Rails: None (pt son reports she can use door frame) Entrance Stairs-Number of Steps: 2   Home Layout: One level Home Equipment: [place] (2 wheels);Shower seat;Hand held shower head;Cane - single point      Prior Function Prior Level of Function : Independent/Modified Independent;Driving             Mobility Comments: QC for household and community mobility ADLs Comments: Mod I with ADLs using AD/DME; has not driven in 2 months     Hand Dominance   Dominant Hand: Right    Extremity/Trunk Assessment   Upper Extremity Assessment Upper Extremity Assessment: Overall WFL for tasks assessed    Lower Extremity Assessment Lower Extremity Assessment: LLE deficits/detail LLE Deficits / Details: trace activation in gravity minimized position, grossly 3-/5 for knee flexion/extension and hip flexion. reports no sensation anterior aspect L shin LLE Sensation: decreased light touch LLE Coordination: decreased fine motor;decreased gross motor    Cervical / Trunk Assessment Cervical / Trunk Assessment: Back Surgery  Communication   Communication: No difficulties  Cognition  Arousal/Alertness: Awake/alert Behavior During Therapy: WFL for tasks assessed/performed Overall Cognitive Status: Within Functional Limits for tasks assessed                                          General Comments General comments (skin integrity, edema, etc.): Clean, dry dressing at incision. Son present at bedside.     PT Assessment All further PT needs can be met in the next venue of care  PT Problem List Decreased strength;Decreased range of motion;Decreased activity tolerance;Decreased balance;Decreased mobility;Pain;Impaired sensation           PT Goals (Current goals can be found in the Care Plan section)  Acute Rehab PT Goals Patient Stated Goal: return to gardening PT Goal Formulation: With patient Time For Goal Achievement: 01/28/21 Potential to Achieve Goals: Good     AM-PAC PT "6 Clicks" Mobility  Outcome Measure Help needed turning from your back to your side while in a flat bed without using bedrails?: A Little Help needed moving from lying on your back to sitting on the side of a flat bed without using bedrails?: A Little Help needed moving to and from a bed to a chair (including a wheelchair)?: A Little Help needed standing up from a chair using your arms (e.g., wheelchair or bedside chair)?: A Little Help needed to walk in hospital room?: A Little Help needed climbing 3-5 steps with a railing? : A Little 6 Click Score: 18    End of Session Equipment Utilized During Treatment: Gait belt Activity Tolerance: Patient tolerated treatment well Patient left: in bed;with  call bell/phone within reach;with family/visitor present Nurse Communication: Mobility status PT Visit Diagnosis: Unsteadiness on feet (R26.81);Other abnormalities of gait and mobility (R26.89);Pain;Muscle weakness (generalized) (M62.81) Pain - Right/Left: Left    Time: 4884-5733 PT Time Calculation (min) (ACUTE ONLY): 30 min   Charges:   PT Evaluation $PT Eval Low  Complexity: 1 Low PT Treatments $Gait Training: 8-22 mins        West Carbo, PT, DPT   Acute Rehabilitation Department Pager #: [phone]  Sandra Cockayne 01/14/2021, 2:58 PM

## 2021-01-14 NOTE — Plan of Care (Signed)
Patient alert and oriented, mae's well, voiding adequate amount of urine, swallowing without difficulty, no c/o pain at time of discharge. Patient discharged home with family. Script and discharged instructions given to patient. Patient and family stated understanding of instructions given. Patient has an appointment with Dr. Pool  

## 2021-01-14 NOTE — Anesthesia Postprocedure Evaluation (Signed)
Anesthesia Post Note  Patient: Belinda Brown  Procedure(s) Performed: Laminectomy and Foraminotomy - left - - Lumbar two-Lumbar three (Left: Back)     Patient location during evaluation: PACU Anesthesia Type: General Level of consciousness: awake and alert Pain management: pain level controlled Vital Signs Assessment: post-procedure vital signs reviewed and stable Respiratory status: spontaneous breathing, nonlabored ventilation and respiratory function stable Cardiovascular status: stable and blood pressure returned to baseline Anesthetic complications: no   No notable events documented.  Last Vitals:  Vitals:   01/14/21 1130 01/14/21 1223  BP: (!) 113/95 (!) 150/89  Pulse: 62 79  Resp: 11 20  Temp:    SpO2: 100% 100%    Last Pain:  Vitals:   01/14/21 1100  TempSrc:   PainSc: 0-No pain                 Audry Pili

## 2021-01-14 NOTE — Discharge Instructions (Addendum)
Wound Care Keep incision covered and dry for two days.  If you shower, cover incision with plastic wrap.  Do not put any creams, lotions, or ointments on incision. Leave steri-strips on back.  They will fall off by themselves. Activity Walk each and every day, increasing distance each day. No lifting greater than 5 lbs.  Avoid excessive neck motion. No driving for 2 weeks; may ride as a passenger locally.  Diet Resume your normal diet.   Call Your Doctor If Any of These Occur Redness, drainage, or swelling at the wound.  Temperature greater than 101 degrees. Severe pain not relieved by pain medication. Incision starts to come apart. Follow Up Appt Call today for appointment in 1-2 weeks [phone]) or for problems.  If you have any hardware placed in your spine, you will need an x-ray before your appointment.

## 2021-01-14 NOTE — H&P (Signed)
Belinda Brown is an 86 y.o. female.   Chief Complaint: Left leg pain HPI: 86 year old female with a remote history of a prior L3-4-5 decompression and fusion with good results.  Patient presents with severe left lower extremity pain failing conservative management with increasing sensory loss and weakness.  Complicating matters are a recent history of herpes zoster involving her left lower extremity.  The zoster has completely resolved however she still has the pain and the weakness.  Work-up demonstrates evidence of significant stenosis on the left at L2-3.  The patient presents now for decompressive surgery in hopes of improving her symptoms  Past Medical History:  Diagnosis Date   Arthritis    back & L thumb   Degenerative joint disease (DJD) of lumbar spine 12/2017   Had lumbar decompression and fusion (L4-L5). ->  Did note some improvement like her back pain but still struggling with back pain and fatigue.  Not back to her previous baseline.   Hypercholesteremia    Hypertension    Nonobstructive atherosclerosis of coronary artery 2008   Nonobstructive disease by cath   OAB (overactive bladder)    Mostly at night.  She takes oxybutynin but no significant improvement.   Obstructive sleep apnea, moderate: ASI of 25 and 22 on home sleep study. 10/08/2018   Has tried 2 different masks-could not keep him on because he feels restrictive.   Thymoma 11/24/2005   s/p resection of thymic mass    Past Surgical History:  Procedure Laterality Date   CARDIAC CATHETERIZATION  2008   [facility]. - Dr. Geraldo Pitter -- non-obstructive CAD (by pt report - ~50% single vessel)   CERVICAL LAMINECTOMY  1986   EYE SURGERY     cataracts removed- bilateral    NM MYOVIEW LTD  06/23/2016   EF 30-35% with severe hypokinesis of the distal septal, distal inferior, mid/distal anterior, mid/distal anterolateral walls and Akinesis of the apex. The cavity size was normal. Wall thickness was normal. Trivial  Aortic regurgitation. Mitral Mild MR   partial mediastinoscopy with thymectomy and resection of thymic mass  11/24/2005   [place]   TRANSTHORACIC ECHOCARDIOGRAM  06/19/2016   Normal LV size and function. EF 55-60%. GR 1 DD. No regional wall motion abnormalities. Mild (grade 1) DD. Mild LVH and trace AI,MR,TR   Treadmill Stress Echo  2016   [facility]: report not avaialble.  Pt reports - Normal result   TUBAL LIGATION      Family History  Problem Relation Age of Onset   Congestive Heart Failure Mother    Heart attack Father    Cancer Sister    COPD Brother    Heart failure Maternal Grandmother    Pneumonia Maternal Grandfather    Heart failure Paternal Grandmother    Heart failure Paternal Grandfather    Social History:  reports that she has never smoked. She has never used smokeless tobacco. She reports current alcohol use. She reports that she does not use drugs.  Allergies:  Allergies  Allergen Reactions   Ampicillin Hives    Has patient had a PCN reaction causing immediate rash, facial/tongue/throat swelling, SOB or lightheadedness with hypotension: Yes Has patient had a PCN reaction causing severe rash involving mucus membranes or skin necrosis: No Has patient had a PCN reaction that required hospitalization: No Has patient had a PCN reaction occurring within the last 10 years: No If all of the above answers are "NO", then may proceed with Cephalosporin use.  Medications Prior to Admission  Medication Sig Dispense Refill   amitriptyline (ELAVIL) 10 MG tablet Take 10 mg by mouth at bedtime.     cholecalciferol (VITAMIN D) 1000 units tablet Take 1,000 Units by mouth daily.     hydrochlorothiazide (HYDRODIURIL) 25 MG tablet Take 25 mg by mouth daily.  0   HYDROcodone-acetaminophen (NORCO/VICODIN) 5-325 MG tablet Take 2 tablets by mouth every 4 (four) hours as needed. (Patient taking differently: Take 1 tablet by mouth every 6 (six) hours as needed for severe pain.) 10  tablet 0   ibuprofen (ADVIL) 800 MG tablet Take 800 mg by mouth 3 (three) times daily.     losartan (COZAAR) 50 MG tablet Take 50 mg by mouth daily.  0   OVER THE COUNTER MEDICATION Apply 1 application topically 2 (two) times daily as needed (pain). [place] Roll On Pain Relief     pregabalin (LYRICA) 75 MG capsule Take 1 capsule (75 mg total) by mouth 2 (two) times daily. (Patient taking differently: Take 75 mg by mouth 3 (three) times daily.) 30 capsule 0   vitamin B-12 (CYANOCOBALAMIN) 500 MCG tablet Take 500 mcg by mouth daily.     vitamin C (ASCORBIC ACID) 500 MG tablet Take 500 mg by mouth daily.     aspirin EC 81 MG tablet Take 81 mg by mouth daily.      No results found for this or any previous visit (from the past 48 hour(s)). No results found.  Pertinent items noted in HPI and remainder of comprehensive ROS otherwise negative.  Blood pressure 133/83, pulse 79, temperature 98.7 F (37.1 C), temperature source Oral, resp. rate 17, height 5\' 5"  (1.651 m), weight 61.2 kg, SpO2 99 %.  Patient is awake and alert.  She is oriented and appropriate.  Speech is fluent.  Judgment insight are intact.  Cranial nerve function normal bilateral.  Motor examination reveals weakness of her left-sided quadriceps muscle group grading out at 3/5.  She is weakness of her anterior tibialis grading out 3/5.  She has weakness of her EHL grading out at 1/5.  She has decreased plantar flexion strength grading at 4/5 on the left side.  Right lower extremity strength is normal.  Sensory examination with some patchy distal sensory loss diffusely in her left lower extremity.  Gait is antalgic.  Posture is mildly flexed peer examination head ears eyes nose and throat is unremarkable chest and abdomen are benign.  Extremities are free from injury deformity.  Skin is free from any vesicular outbreaks or other abnormality. Assessment/Plan Left L2-3 spinal stenosis with neurogenic claudication and radiculopathy.  Plan left  L2-3 decompressive laminotomy and foraminotomy.  Risks and benefits been explained.  Patient wishes to proceed.  Cooper Render Belinda Brown 01/14/2021, 7:54 AM

## 2021-01-14 NOTE — Care Management (Signed)
ED RNCM received call from Aishatu RN concerning late discharge [facility] consult for [facility] service. Reviewed orders for [facility] PT/OT services, [employer] medicare coverage.  Discussed with nurse to inform patient that we will forward [facility] referral  to [facility] [employer] [place] companies within the [employer].  All Information will be placed on AVS.

## 2021-01-15 ENCOUNTER — Encounter (HOSPITAL_COMMUNITY): Payer: Self-pay | Admitting: Neurosurgery

## 2021-01-25 DIAGNOSIS — M48062 Spinal stenosis, lumbar region with neurogenic claudication: Secondary | ICD-10-CM | POA: Diagnosis not present

## 2021-01-25 DIAGNOSIS — M5416 Radiculopathy, lumbar region: Secondary | ICD-10-CM | POA: Diagnosis not present

## 2021-01-25 DIAGNOSIS — M47816 Spondylosis without myelopathy or radiculopathy, lumbar region: Secondary | ICD-10-CM | POA: Diagnosis not present

## 2021-01-25 DIAGNOSIS — Z4789 Encounter for other orthopedic aftercare: Secondary | ICD-10-CM | POA: Diagnosis not present

## 2021-01-25 DIAGNOSIS — N3281 Overactive bladder: Secondary | ICD-10-CM | POA: Diagnosis not present

## 2021-01-25 DIAGNOSIS — E785 Hyperlipidemia, unspecified: Secondary | ICD-10-CM | POA: Diagnosis not present

## 2021-01-25 DIAGNOSIS — I10 Essential (primary) hypertension: Secondary | ICD-10-CM | POA: Diagnosis not present

## 2021-01-25 DIAGNOSIS — G4733 Obstructive sleep apnea (adult) (pediatric): Secondary | ICD-10-CM | POA: Diagnosis not present

## 2021-01-25 DIAGNOSIS — Z7982 Long term (current) use of aspirin: Secondary | ICD-10-CM | POA: Diagnosis not present

## 2021-01-25 DIAGNOSIS — I2510 Atherosclerotic heart disease of native coronary artery without angina pectoris: Secondary | ICD-10-CM | POA: Diagnosis not present

## 2021-02-28 DIAGNOSIS — M6281 Muscle weakness (generalized): Secondary | ICD-10-CM | POA: Diagnosis not present

## 2021-02-28 DIAGNOSIS — M961 Postlaminectomy syndrome, not elsewhere classified: Secondary | ICD-10-CM | POA: Diagnosis not present

## 2021-02-28 DIAGNOSIS — M545 Low back pain, unspecified: Secondary | ICD-10-CM | POA: Diagnosis not present

## 2021-03-04 DIAGNOSIS — M545 Low back pain, unspecified: Secondary | ICD-10-CM | POA: Diagnosis not present

## 2021-03-04 DIAGNOSIS — M6281 Muscle weakness (generalized): Secondary | ICD-10-CM | POA: Diagnosis not present

## 2021-03-04 DIAGNOSIS — M961 Postlaminectomy syndrome, not elsewhere classified: Secondary | ICD-10-CM | POA: Diagnosis not present

## 2021-03-09 DIAGNOSIS — M6281 Muscle weakness (generalized): Secondary | ICD-10-CM | POA: Diagnosis not present

## 2021-03-09 DIAGNOSIS — M961 Postlaminectomy syndrome, not elsewhere classified: Secondary | ICD-10-CM | POA: Diagnosis not present

## 2021-03-09 DIAGNOSIS — M545 Low back pain, unspecified: Secondary | ICD-10-CM | POA: Diagnosis not present

## 2021-03-14 DIAGNOSIS — B0229 Other postherpetic nervous system involvement: Secondary | ICD-10-CM | POA: Diagnosis not present

## 2021-03-14 DIAGNOSIS — E785 Hyperlipidemia, unspecified: Secondary | ICD-10-CM | POA: Diagnosis not present

## 2021-03-14 DIAGNOSIS — I10 Essential (primary) hypertension: Secondary | ICD-10-CM | POA: Diagnosis not present

## 2021-03-14 DIAGNOSIS — I2510 Atherosclerotic heart disease of native coronary artery without angina pectoris: Secondary | ICD-10-CM | POA: Diagnosis not present

## 2021-03-14 DIAGNOSIS — M5416 Radiculopathy, lumbar region: Secondary | ICD-10-CM | POA: Diagnosis not present

## 2021-03-14 DIAGNOSIS — M545 Low back pain, unspecified: Secondary | ICD-10-CM | POA: Diagnosis not present

## 2021-03-14 DIAGNOSIS — R7303 Prediabetes: Secondary | ICD-10-CM | POA: Diagnosis not present

## 2021-03-14 DIAGNOSIS — Z6822 Body mass index (BMI) 22.0-22.9, adult: Secondary | ICD-10-CM | POA: Diagnosis not present

## 2021-03-16 DIAGNOSIS — M545 Low back pain, unspecified: Secondary | ICD-10-CM | POA: Diagnosis not present

## 2021-03-16 DIAGNOSIS — M6281 Muscle weakness (generalized): Secondary | ICD-10-CM | POA: Diagnosis not present

## 2021-03-16 DIAGNOSIS — M961 Postlaminectomy syndrome, not elsewhere classified: Secondary | ICD-10-CM | POA: Diagnosis not present

## 2021-03-25 DIAGNOSIS — M6281 Muscle weakness (generalized): Secondary | ICD-10-CM | POA: Diagnosis not present

## 2021-03-25 DIAGNOSIS — M961 Postlaminectomy syndrome, not elsewhere classified: Secondary | ICD-10-CM | POA: Diagnosis not present

## 2021-03-25 DIAGNOSIS — M545 Low back pain, unspecified: Secondary | ICD-10-CM | POA: Diagnosis not present

## 2021-04-13 DIAGNOSIS — M6281 Muscle weakness (generalized): Secondary | ICD-10-CM | POA: Diagnosis not present

## 2021-04-13 DIAGNOSIS — M545 Low back pain, unspecified: Secondary | ICD-10-CM | POA: Diagnosis not present

## 2021-04-13 DIAGNOSIS — M961 Postlaminectomy syndrome, not elsewhere classified: Secondary | ICD-10-CM | POA: Diagnosis not present

## 2021-04-21 DIAGNOSIS — H524 Presbyopia: Secondary | ICD-10-CM | POA: Diagnosis not present

## 2021-04-22 DIAGNOSIS — M961 Postlaminectomy syndrome, not elsewhere classified: Secondary | ICD-10-CM | POA: Diagnosis not present

## 2021-04-22 DIAGNOSIS — M6281 Muscle weakness (generalized): Secondary | ICD-10-CM | POA: Diagnosis not present

## 2021-04-22 DIAGNOSIS — M545 Low back pain, unspecified: Secondary | ICD-10-CM | POA: Diagnosis not present

## 2021-04-29 DIAGNOSIS — M6281 Muscle weakness (generalized): Secondary | ICD-10-CM | POA: Diagnosis not present

## 2021-04-29 DIAGNOSIS — M545 Low back pain, unspecified: Secondary | ICD-10-CM | POA: Diagnosis not present

## 2021-04-29 DIAGNOSIS — M961 Postlaminectomy syndrome, not elsewhere classified: Secondary | ICD-10-CM | POA: Diagnosis not present

## 2021-05-05 DIAGNOSIS — M545 Low back pain, unspecified: Secondary | ICD-10-CM | POA: Diagnosis not present

## 2021-05-05 DIAGNOSIS — M961 Postlaminectomy syndrome, not elsewhere classified: Secondary | ICD-10-CM | POA: Diagnosis not present

## 2021-05-05 DIAGNOSIS — M6281 Muscle weakness (generalized): Secondary | ICD-10-CM | POA: Diagnosis not present

## 2021-05-16 DIAGNOSIS — Z9181 History of falling: Secondary | ICD-10-CM | POA: Diagnosis not present

## 2021-05-16 DIAGNOSIS — Z1331 Encounter for screening for depression: Secondary | ICD-10-CM | POA: Diagnosis not present

## 2021-05-16 DIAGNOSIS — Z0000 Encounter for general adult medical examination without abnormal findings: Secondary | ICD-10-CM | POA: Diagnosis not present

## 2021-05-16 DIAGNOSIS — E785 Hyperlipidemia, unspecified: Secondary | ICD-10-CM | POA: Diagnosis not present

## 2021-05-18 DIAGNOSIS — M48062 Spinal stenosis, lumbar region with neurogenic claudication: Secondary | ICD-10-CM | POA: Diagnosis not present

## 2021-05-26 DIAGNOSIS — M961 Postlaminectomy syndrome, not elsewhere classified: Secondary | ICD-10-CM | POA: Diagnosis not present

## 2021-05-26 DIAGNOSIS — M545 Low back pain, unspecified: Secondary | ICD-10-CM | POA: Diagnosis not present

## 2021-05-26 DIAGNOSIS — M6281 Muscle weakness (generalized): Secondary | ICD-10-CM | POA: Diagnosis not present

## 2021-05-31 DIAGNOSIS — M545 Low back pain, unspecified: Secondary | ICD-10-CM | POA: Diagnosis not present

## 2021-05-31 DIAGNOSIS — M6281 Muscle weakness (generalized): Secondary | ICD-10-CM | POA: Diagnosis not present

## 2021-05-31 DIAGNOSIS — M961 Postlaminectomy syndrome, not elsewhere classified: Secondary | ICD-10-CM | POA: Diagnosis not present

## 2021-06-09 DIAGNOSIS — M6281 Muscle weakness (generalized): Secondary | ICD-10-CM | POA: Diagnosis not present

## 2021-06-09 DIAGNOSIS — M961 Postlaminectomy syndrome, not elsewhere classified: Secondary | ICD-10-CM | POA: Diagnosis not present

## 2021-06-09 DIAGNOSIS — M545 Low back pain, unspecified: Secondary | ICD-10-CM | POA: Diagnosis not present

## 2021-06-16 DIAGNOSIS — M6281 Muscle weakness (generalized): Secondary | ICD-10-CM | POA: Diagnosis not present

## 2021-06-16 DIAGNOSIS — M961 Postlaminectomy syndrome, not elsewhere classified: Secondary | ICD-10-CM | POA: Diagnosis not present

## 2021-06-16 DIAGNOSIS — M545 Low back pain, unspecified: Secondary | ICD-10-CM | POA: Diagnosis not present

## 2021-06-23 DIAGNOSIS — M545 Low back pain, unspecified: Secondary | ICD-10-CM | POA: Diagnosis not present

## 2021-06-23 DIAGNOSIS — M961 Postlaminectomy syndrome, not elsewhere classified: Secondary | ICD-10-CM | POA: Diagnosis not present

## 2021-06-23 DIAGNOSIS — M6281 Muscle weakness (generalized): Secondary | ICD-10-CM | POA: Diagnosis not present

## 2021-07-12 ENCOUNTER — Observation Stay (HOSPITAL_BASED_OUTPATIENT_CLINIC_OR_DEPARTMENT_OTHER)
Admission: EM | Admit: 2021-07-12 | Discharge: 2021-07-14 | Disposition: A | Discharge status: Home / Self Care | Payer: Medicare HMO | Attending: Internal Medicine | Admitting: Internal Medicine

## 2021-07-12 ENCOUNTER — Encounter (HOSPITAL_BASED_OUTPATIENT_CLINIC_OR_DEPARTMENT_OTHER): Payer: Self-pay | Admitting: Emergency Medicine

## 2021-07-12 ENCOUNTER — Emergency Department (HOSPITAL_BASED_OUTPATIENT_CLINIC_OR_DEPARTMENT_OTHER): Payer: Medicare HMO

## 2021-07-12 ENCOUNTER — Other Ambulatory Visit: Payer: Self-pay

## 2021-07-12 DIAGNOSIS — R770 Abnormality of albumin: Secondary | ICD-10-CM | POA: Diagnosis not present

## 2021-07-12 DIAGNOSIS — K828 Other specified diseases of gallbladder: Secondary | ICD-10-CM

## 2021-07-12 DIAGNOSIS — G8929 Other chronic pain: Secondary | ICD-10-CM | POA: Diagnosis present

## 2021-07-12 DIAGNOSIS — R1011 Right upper quadrant pain: Secondary | ICD-10-CM | POA: Diagnosis not present

## 2021-07-12 DIAGNOSIS — E871 Hypo-osmolality and hyponatremia: Secondary | ICD-10-CM | POA: Insufficient documentation

## 2021-07-12 DIAGNOSIS — R079 Chest pain, unspecified: Secondary | ICD-10-CM | POA: Diagnosis not present

## 2021-07-12 DIAGNOSIS — K8012 Calculus of gallbladder with acute and chronic cholecystitis without obstruction: Secondary | ICD-10-CM | POA: Diagnosis not present

## 2021-07-12 DIAGNOSIS — K8020 Calculus of gallbladder without cholecystitis without obstruction: Secondary | ICD-10-CM | POA: Diagnosis not present

## 2021-07-12 DIAGNOSIS — R109 Unspecified abdominal pain: Secondary | ICD-10-CM | POA: Diagnosis present

## 2021-07-12 DIAGNOSIS — R2681 Unsteadiness on feet: Secondary | ICD-10-CM | POA: Diagnosis not present

## 2021-07-12 DIAGNOSIS — Z7982 Long term (current) use of aspirin: Secondary | ICD-10-CM | POA: Insufficient documentation

## 2021-07-12 DIAGNOSIS — E876 Hypokalemia: Secondary | ICD-10-CM | POA: Insufficient documentation

## 2021-07-12 DIAGNOSIS — K8050 Calculus of bile duct without cholangitis or cholecystitis without obstruction: Secondary | ICD-10-CM | POA: Diagnosis present

## 2021-07-12 DIAGNOSIS — R1013 Epigastric pain: Secondary | ICD-10-CM | POA: Diagnosis not present

## 2021-07-12 DIAGNOSIS — I10 Essential (primary) hypertension: Secondary | ICD-10-CM | POA: Diagnosis not present

## 2021-07-12 DIAGNOSIS — N3289 Other specified disorders of bladder: Secondary | ICD-10-CM | POA: Diagnosis not present

## 2021-07-12 DIAGNOSIS — M549 Dorsalgia, unspecified: Secondary | ICD-10-CM | POA: Diagnosis not present

## 2021-07-12 DIAGNOSIS — R17 Unspecified jaundice: Secondary | ICD-10-CM | POA: Diagnosis not present

## 2021-07-12 DIAGNOSIS — D649 Anemia, unspecified: Secondary | ICD-10-CM | POA: Diagnosis not present

## 2021-07-12 DIAGNOSIS — K5730 Diverticulosis of large intestine without perforation or abscess without bleeding: Secondary | ICD-10-CM | POA: Diagnosis not present

## 2021-07-12 DIAGNOSIS — R0789 Other chest pain: Secondary | ICD-10-CM | POA: Diagnosis not present

## 2021-07-12 DIAGNOSIS — I2510 Atherosclerotic heart disease of native coronary artery without angina pectoris: Secondary | ICD-10-CM | POA: Diagnosis not present

## 2021-07-12 DIAGNOSIS — I700 Atherosclerosis of aorta: Secondary | ICD-10-CM | POA: Diagnosis not present

## 2021-07-12 DIAGNOSIS — Z79899 Other long term (current) drug therapy: Secondary | ICD-10-CM | POA: Diagnosis not present

## 2021-07-12 LAB — CBC WITH DIFFERENTIAL/PLATELET
Abs Immature Granulocytes: 0.02 10*3/uL (ref 0.00–0.07)
Basophils Absolute: 0 10*3/uL (ref 0.0–0.1)
Basophils Relative: 1 %
Eosinophils Absolute: 0.1 10*3/uL (ref 0.0–0.5)
Eosinophils Relative: 1 %
HCT: 42.8 % (ref 36.0–46.0)
Hemoglobin: 14.7 g/dL (ref 12.0–15.0)
Immature Granulocytes: 0 %
Lymphocytes Relative: 14 %
Lymphs Abs: 1.1 10*3/uL (ref 0.7–4.0)
MCH: 31.5 pg (ref 26.0–34.0)
MCHC: 34.3 g/dL (ref 30.0–36.0)
MCV: 91.8 fL (ref 80.0–100.0)
Monocytes Absolute: 0.7 10*3/uL (ref 0.1–1.0)
Monocytes Relative: 8 %
Neutro Abs: 6 10*3/uL (ref 1.7–7.7)
Neutrophils Relative %: 76 %
Platelets: 198 10*3/uL (ref 150–400)
RBC: 4.66 MIL/uL (ref 3.87–5.11)
RDW: 13.2 % (ref 11.5–15.5)
WBC: 7.9 10*3/uL (ref 4.0–10.5)
nRBC: 0 % (ref 0.0–0.2)

## 2021-07-12 LAB — COMPREHENSIVE METABOLIC PANEL
ALT: 14 U/L (ref 0–44)
AST: 18 U/L (ref 15–41)
Albumin: 4.3 g/dL (ref 3.5–5.0)
Alkaline Phosphatase: 68 U/L (ref 38–126)
Anion gap: 9 (ref 5–15)
BUN: 16 mg/dL (ref 8–23)
CO2: 29 mmol/L (ref 22–32)
Calcium: 9.7 mg/dL (ref 8.9–10.3)
Chloride: 98 mmol/L (ref 98–111)
Creatinine, Ser: 0.68 mg/dL (ref 0.44–1.00)
GFR, Estimated: >60 mL/min (ref >60)
Glucose, Bld: 126 mg/dL — ABNORMAL HIGH (ref 70–99)
Potassium: 3.5 mmol/L (ref 3.5–5.1)
Sodium: 136 mmol/L (ref 135–145)
Total Bilirubin: 1.4 mg/dL — ABNORMAL HIGH (ref 0.3–1.2)
Total Protein: 7.1 g/dL (ref 6.5–8.1)

## 2021-07-12 LAB — LACTIC ACID, PLASMA
Lactic Acid, Venous: 0.8 mmol/L (ref 0.5–1.9)
Lactic Acid, Venous: 0.7 mmol/L (ref 0.5–1.9)

## 2021-07-12 LAB — TROPONIN I (HIGH SENSITIVITY)
Troponin I (High Sensitivity): 5 ng/L (ref <18)
Troponin I (High Sensitivity): 5 ng/L (ref <18)

## 2021-07-12 LAB — LIPASE, BLOOD: Lipase: 34 U/L (ref 11–51)

## 2021-07-12 MED ORDER — ASPIRIN 81 MG PO CHEW
324.0000 mg | CHEWABLE_TABLET | Freq: Once | ORAL | Status: AC
  Administered 2021-07-12: 324 mg via ORAL
  Filled 2021-07-12: qty 4

## 2021-07-12 MED ORDER — PREGABALIN 75 MG PO CAPS
75.0000 mg | ORAL_CAPSULE | Freq: Two times a day (BID) | ORAL | Status: DC
  Administered 2021-07-12 – 2021-07-14 (×4): 75 mg via ORAL
  Filled 2021-07-12 (×4): qty 1

## 2021-07-12 MED ORDER — IOHEXOL 300 MG/ML  SOLN
100.0000 mL | Freq: Once | INTRAMUSCULAR | Status: AC | PRN
  Administered 2021-07-12: 100 mL via INTRAVENOUS

## 2021-07-12 MED ORDER — CIPROFLOXACIN IN D5W 400 MG/200ML IV SOLN
400.0000 mg | Freq: Once | INTRAVENOUS | Status: AC
  Administered 2021-07-12: 400 mg via INTRAVENOUS
  Filled 2021-07-12: qty 200

## 2021-07-12 NOTE — ED Notes (Signed)
Pt provided with peanut butter crackers and gingerale for po challenge per provider.

## 2021-07-12 NOTE — ED Notes (Signed)
ED Provider at bedside. 

## 2021-07-12 NOTE — ED Provider Notes (Signed)
[facility] EMERGENCY DEPARTMENT Provider Note   CSN: [id number] Arrival date & time: 07/12/21  1611     History  Chief Complaint  Patient presents with   Chest Pain    Belinda Brown is a 87 y.o. female.  The history is provided by the patient. No language interpreter was used.  Chest Pain Pain location:  Epigastric and substernal area Pain quality: aching, crushing, pressure and tightness   Pain radiates to:  Epigastrium Pain severity:  Severe Onset quality:  Gradual Duration:  4 days Timing:  Intermittent Progression:  Waxing and waning Chronicity:  New Context: eating and at rest   Relieved by:  Nothing Exacerbated by: eating. Associated symptoms: abdominal pain, nausea and vomiting   Associated symptoms: no altered mental status, no anorexia, no back pain, no cough, no fatigue, no fever, no headache, no lower extremity edema, no numbness, no palpitations, no shortness of breath and no weakness   Risk factors: coronary artery disease, high cholesterol and hypertension   Risk factors: not female and no prior DVT/PE       Home Medications Prior to Admission medications   Medication Sig Start Date End Date Taking? Authorizing Provider  amitriptyline (ELAVIL) 10 MG tablet Take 10 mg by mouth at bedtime.    [provider]  aspirin EC 81 MG tablet Take 81 mg by mouth daily.    [provider]  cholecalciferol (VITAMIN D) 1000 units tablet Take 1,000 Units by mouth daily.    [provider]  hydrochlorothiazide (HYDRODIURIL) 25 MG tablet Take 25 mg by mouth daily. 10/02/17   [provider]  HYDROcodone-acetaminophen (NORCO/VICODIN) 5-325 MG tablet Take 2 tablets by mouth every 4 (four) hours as needed. Patient taking differently: Take 1 tablet by mouth every 6 (six) hours as needed for severe pain. 12/08/20   Sherrill Raring, PA-C  ibuprofen (ADVIL) 800 MG tablet Take 800 mg by mouth 3 (three) times daily.    [provider]   losartan (COZAAR) 50 MG tablet Take 50 mg by mouth daily. 10/02/17   [provider]  OVER THE COUNTER MEDICATION Apply 1 application topically 2 (two) times daily as needed (pain). [place] Roll On Pain Relief    [provider]  pregabalin (LYRICA) 75 MG capsule Take 1 capsule (75 mg total) by mouth 2 (two) times daily. Patient taking differently: Take 75 mg by mouth 3 (three) times daily. 12/08/20   Sherrill Raring, PA-C  vitamin B-12 (CYANOCOBALAMIN) 500 MCG tablet Take 500 mcg by mouth daily.    [provider]  vitamin C (ASCORBIC ACID) 500 MG tablet Take 500 mg by mouth daily.    [provider]      Allergies    Ampicillin and Codeine    Review of Systems   Review of Systems  Constitutional:  Negative for chills, fatigue and fever.  HENT:  Negative for congestion.   Respiratory:  Negative for cough, chest tightness, shortness of breath and wheezing.   Cardiovascular:  Positive for chest pain. Negative for palpitations and leg swelling.  Gastrointestinal:  Positive for abdominal pain, diarrhea (chronic), nausea and vomiting. Negative for abdominal distention, anorexia and constipation.  Genitourinary:  Negative for dysuria, flank pain and frequency.  Musculoskeletal:  Negative for back pain, neck pain and neck stiffness.  Skin:  Negative for rash and wound.  Neurological:  Negative for weakness, light-headedness, numbness and headaches.  Psychiatric/Behavioral:  Negative for agitation and confusion.   All other systems  reviewed and are negative.  Physical Exam Updated Vital Signs BP (!) 145/75 (BP Location: Left Arm)   Pulse 74   Temp 98.2 F (36.8 C) (Oral)   Resp 20   Ht '5\' 5"'$  (1.651 m)   Wt 61.2 kg   LMP  (LMP Unknown)   SpO2 97%   BMI 22.47 kg/m  Physical Exam Vitals and nursing note reviewed.  Constitutional:      General: She is not in acute distress.    Appearance: She is well-developed. She is not ill-appearing, toxic-appearing  or diaphoretic.  HENT:     Head: Normocephalic and atraumatic.  Eyes:     Extraocular Movements: Extraocular movements intact.     Conjunctiva/sclera: Conjunctivae normal.     Pupils: Pupils are equal, round, and reactive to light.  Cardiovascular:     Rate and Rhythm: Normal rate and regular rhythm.     Heart sounds: Normal heart sounds. No murmur heard. Pulmonary:     Effort: Pulmonary effort is normal. No respiratory distress.     Breath sounds: Normal breath sounds. No decreased breath sounds, wheezing, rhonchi or rales.  Chest:     Chest wall: Tenderness present.  Abdominal:     Palpations: Abdomen is soft.     Tenderness: There is abdominal tenderness.  Musculoskeletal:        General: No swelling.     Cervical back: Neck supple.     Right lower leg: No tenderness. No edema.     Left lower leg: No tenderness. No edema.  Skin:    General: Skin is warm and dry.     Capillary Refill: Capillary refill takes less than 2 seconds.     Findings: No erythema.  Neurological:     General: No focal deficit present.     Mental Status: She is alert.  Psychiatric:        Mood and Affect: Mood normal.    ED Results / Procedures / Treatments   Labs (all labs ordered are listed, but only abnormal results are displayed) Labs Reviewed  COMPREHENSIVE METABOLIC PANEL - Abnormal; Notable for the following components:      Result Value   Glucose, Bld 126 (*)    Total Bilirubin 1.4 (*)    All other components within normal limits  CBC WITH DIFFERENTIAL/PLATELET  LIPASE, BLOOD  LACTIC ACID, PLASMA  LACTIC ACID, PLASMA  TROPONIN I (HIGH SENSITIVITY)  TROPONIN I (HIGH SENSITIVITY)    EKG EKG Interpretation  Date/Time:  Tuesday Jul 12 2021 16:27:12 EDT Ventricular Rate:  69 PR Interval:  210 QRS Duration: 78 QT Interval:  400 QTC Calculation: 428 R Axis:   -35 Text Interpretation: Sinus rhythm with 1st degree A-V block Left axis deviation Low voltage QRS Cannot rule out Anterior  infarct , age undetermined Abnormal ECG When compared with ECG of 12-Aug-2018 13:38, PREVIOUS ECG IS PRESENT when compared to prior, similar appearance. No STEMI Confirmed by Antony Blackbird [phone]) on 07/12/2021 4:37:07 PM  Radiology DG Chest 2 View  Result Date: 07/12/2021 CLINICAL DATA:  Chest and epigastric pain EXAM: CHEST - 2 VIEW COMPARISON:  08/12/2018 FINDINGS: Normal heart size, mediastinal contours, and pulmonary vascularity. Prior median sternotomy. Lungs hyperinflated but clear. No pulmonary infiltrate, pleural effusion, or pneumothorax. Bones demineralized. IMPRESSION: Hyperinflated lungs without acute infiltrate. Electronically Signed   By: Lavonia Dana M.D.   On: 07/12/2021 17:05   CT ABDOMEN PELVIS W CONTRAST  Result Date: 07/12/2021 CLINICAL DATA:  Upper abdominal pain. EXAM:  CT ABDOMEN AND PELVIS WITH CONTRAST TECHNIQUE: Multidetector CT imaging of the abdomen and pelvis was performed using the standard protocol following bolus administration of intravenous contrast. RADIATION DOSE REDUCTION: This exam was performed according to the departmental dose-optimization program which includes automated exposure control, adjustment of the mA and/or kV according to patient size and/or use of iterative reconstruction technique. CONTRAST:  16m OMNIPAQUE IOHEXOL 300 MG/ML  SOLN COMPARISON:  Abdominal ultrasound 07/12/2021. CT abdomen and pelvis 10/31/2016. FINDINGS: Lower chest: No acute abnormality. Hepatobiliary: Mild diffuse gallbladder wall thickening/edema. This was not well appreciated on ultrasound. No surrounding inflammation or biliary ductal dilatation. Liver is within normal limits. Pancreas: Unremarkable. No pancreatic ductal dilatation or surrounding inflammatory changes. Spleen: Normal in size without focal abnormality. Adrenals/Urinary Tract: There is a rounded hypodensity in the left kidney which is too small to characterize and unchanged, favored as a small cyst. Otherwise, the  adrenal glands and kidneys are within normal limits. There is mild diffuse bladder wall thickening anteriorly. No bladder calculi are seen. Stomach/Bowel: Small hiatal hernia is present. Stomach is otherwise within normal limits. Appendix appears normal. No evidence of bowel wall thickening, distention, or inflammatory changes. There is sigmoid colon diverticulosis without evidence for acute diverticulitis. Vascular/Lymphatic: Aortic atherosclerosis. No enlarged abdominal or pelvic lymph nodes. Reproductive: Uterus and bilateral adnexa are unremarkable. Other: No abdominal wall hernia or abnormality. No abdominopelvic ascites. Musculoskeletal: L3-L5 posterior fusion hardware is present. Multilevel degenerative changes affect the spine. IMPRESSION: 1. Mild gallbladder wall thickening/edema. Please correlate clinically. 2. Mild anterior bladder wall thickening may represent under distension or cystitis. Please correlate clinically. 3. Small hiatal hernia. 4. Sigmoid colon diverticulosis without evidence for diverticulitis. 5.  Aortic Atherosclerosis (ICD10-I70.0). Electronically Signed   By: ARonney AstersM.D.   On: 07/12/2021 22:38   UKoreaAbdomen Limited RUQ (LIVER/GB)  Result Date: 07/12/2021 CLINICAL DATA:  Epigastric pain. EXAM: ULTRASOUND ABDOMEN LIMITED RIGHT UPPER QUADRANT COMPARISON:  CT abdomen pelvis dated October 31, 2016. FINDINGS: Gallbladder: Multiple tiny gallstones. No wall thickening visualized. No sonographic Murphy sign noted by sonographer. Common bile duct: Diameter: 4 mm, normal. Liver: No focal lesion identified. Mildly increased in parenchymal echogenicity. Portal vein is patent on color Doppler imaging with normal direction of blood flow towards the liver. Other: None. IMPRESSION: 1. No acute abnormality. 2. Cholelithiasis. Electronically Signed   By: WTitus DubinM.D.   On: 07/12/2021 17:28    Procedures Procedures    Medications Ordered in ED Medications  ciprofloxacin (CIPRO)  IVPB 400 mg (has no administration in time range)  pregabalin (LYRICA) capsule 75 mg (has no administration in time range)  aspirin chewable tablet 324 mg (324 mg Oral Given 07/12/21 1719)  iohexol (OMNIPAQUE) 300 MG/ML solution 100 mL (100 mLs Intravenous Contrast Given 07/12/21 2212)    ED Course/ Medical Decision Making/ A&P                           Medical Decision Making Problems Addressed: Chest pain, unspecified type: acute illness or injury that poses a threat to life or bodily functions Epigastric pain: acute illness or injury that poses a threat to life or bodily functions  Amount and/or Complexity of Data Reviewed Labs: ordered. Radiology: ordered. ECG/medicine tests: ordered and independent interpretation performed.  Risk OTC drugs. Prescription drug management. Decision regarding hospitalization.    PEREKA BRAUis a 87y.o. female with a past medical history significant for hypertension, hypercholesterolemia, CAD with patient reporting  approximate 40% coronary artery blockage, previous thymoma status post surgery, and arthritis with previous lumbar spine surgery who presents with chest and epigastric discomfort.  According to patient, on Friday she had an episode of discomfort in her epigastric area and lower chest.  She reports that last for several hours and then went away.  She reports that today it happened again and did seem to be provoked after eating several times.  She reports it is into her central chest and does not radiate aside from to the epigastric area.  She reports nausea and vomiting.  She denies it being exertional or pleuritic and denies any shortness of breath with it.  No palpitations or syncope reported.  She denies any constipation but reports chronic diarrhea for months.  She denies any urinary changes.  Denies any trauma.  Reports the pain was up to a 9 out of 10 in severity at its worst and is now gone.  She reports the pain does not radiate to her  back and is not sharp and is more of a tightness and pressure and ache.  She denies any rashes or other complaints.  On exam, lungs were clear.  I did not appreciate a murmur.  Chest was slightly tender to palpation as well as her right upper quadrant epigastric area.  She did have bowel sounds were appreciated and back and flanks nontender.  Legs nontender and nonedematous.  She has chronic left leg numbness after previous shingles and spine surgery.  This is unchanged.  Mucous membranes are not significantly dry and exam otherwise unremarkable.  Intact pulses in extremities.  We will start the work-up to rule out life-threatening condition at this time.  EKG does not show STEMI.  Differential diagnosis includes many etiologies including a gallbladder cause such as cholecystitis or symptomatic cholelithiasis, gastritis or peptic ulcer, unstable angina, ACS, or a pulmonary cause of symptoms.  Also considered his musculoskeletal symptoms.  We will get troponins, labs, and arrival quadrant ultrasound.  We will get chest x-ray and monitor with cardiac monitoring.  We will give aspirin as she reports that the baby aspirin she took today she put in her mouth right before she started vomiting so she does not get any aspirin.  Given her report of a previous proximal 40% blockage in her coronary arteries as well as this central chest pain, anticipate discussion with cardiology if work-up is otherwise reassuring to discuss a plan.  7:03 PM Initial troponin is negative.  Still waiting on second opponent.  Patient's ultrasound does indeed show some gallstones.  Given the location in the epigastric area and right upper quadrant I am most concerned about asymptomatic cholelithiasis at the moment.  Given her reassuring cardiac work-up thus far I have less concern about her heart.  Chest x-ray reassuring.  Other labs also reassuring.  We will call general surgery to discuss what I suspect is symptomatic  cholelithiasis.  10:59 PM Spoke to Dr. Windle Guard with general surgery who agrees that given the gallstones if she has worsened pain with p.o. challenge she like needs admission.  We did a p.o. challenge and patient did indeed have epigastric pain and some nausea.  She also started burping significantly and to rule out partial obstruction we did a CT scan.  CT scan now shows pericholecystic fluid and gallbladder wall thickening.  This is concerning for acute cholecystitis.  Spoke again to Dr. Kae Heller who recommends antibiotics and admission to medicine and n.p.o. at midnight.  They will see  her when she is admitted.  We will call medicine for admission.          Final Clinical Impression(s) / ED Diagnoses Final diagnoses:  Chest pain, unspecified type  Epigastric pain  RUQ abdominal pain  Thickening of wall of gallbladder     Clinical Impression: 1. Chest pain, unspecified type   2. Epigastric pain   3. RUQ abdominal pain   4. Thickening of wall of gallbladder     Disposition: Admit  This note was prepared with assistance of Dragon voice recognition software. Occasional wrong-word or sound-a-like substitutions may have occurred due to the inherent limitations of voice recognition software.      Donalee Gaumond, Gwenyth Allegra, MD 07/12/21 [phone]

## 2021-07-12 NOTE — ED Triage Notes (Signed)
Pt arrives pov, steady gait, referred by UC, c/o midsternal radiating CP to left arm, n/v and epigastric pain today.Also reports diarrhea "for several months"

## 2021-07-13 ENCOUNTER — Observation Stay (HOSPITAL_COMMUNITY): Payer: Medicare HMO | Admitting: Anesthesiology

## 2021-07-13 ENCOUNTER — Other Ambulatory Visit: Payer: Self-pay

## 2021-07-13 ENCOUNTER — Encounter (HOSPITAL_COMMUNITY)
Admission: EM | Disposition: A | Discharge status: Home / Self Care | Payer: Self-pay | Source: Home / Self Care | Attending: Emergency Medicine

## 2021-07-13 ENCOUNTER — Observation Stay (HOSPITAL_BASED_OUTPATIENT_CLINIC_OR_DEPARTMENT_OTHER): Payer: Medicare HMO | Admitting: Anesthesiology

## 2021-07-13 ENCOUNTER — Encounter (HOSPITAL_COMMUNITY): Payer: Self-pay | Admitting: Internal Medicine

## 2021-07-13 DIAGNOSIS — I110 Hypertensive heart disease with heart failure: Secondary | ICD-10-CM | POA: Diagnosis not present

## 2021-07-13 DIAGNOSIS — K8000 Calculus of gallbladder with acute cholecystitis without obstruction: Secondary | ICD-10-CM

## 2021-07-13 DIAGNOSIS — I10 Essential (primary) hypertension: Secondary | ICD-10-CM | POA: Diagnosis not present

## 2021-07-13 DIAGNOSIS — I2510 Atherosclerotic heart disease of native coronary artery without angina pectoris: Secondary | ICD-10-CM

## 2021-07-13 DIAGNOSIS — I509 Heart failure, unspecified: Secondary | ICD-10-CM

## 2021-07-13 DIAGNOSIS — K8050 Calculus of bile duct without cholangitis or cholecystitis without obstruction: Secondary | ICD-10-CM | POA: Diagnosis not present

## 2021-07-13 DIAGNOSIS — M5440 Lumbago with sciatica, unspecified side: Secondary | ICD-10-CM

## 2021-07-13 DIAGNOSIS — R1011 Right upper quadrant pain: Secondary | ICD-10-CM

## 2021-07-13 DIAGNOSIS — G8929 Other chronic pain: Secondary | ICD-10-CM

## 2021-07-13 DIAGNOSIS — K8010 Calculus of gallbladder with chronic cholecystitis without obstruction: Secondary | ICD-10-CM | POA: Diagnosis not present

## 2021-07-13 HISTORY — PX: CHOLECYSTECTOMY: SHX55

## 2021-07-13 LAB — COMPREHENSIVE METABOLIC PANEL
ALT: 11 U/L (ref 0–44)
AST: 15 U/L (ref 15–41)
Albumin: 3.7 g/dL (ref 3.5–5.0)
Alkaline Phosphatase: 56 U/L (ref 38–126)
Anion gap: 9 (ref 5–15)
BUN: 13 mg/dL (ref 8–23)
CO2: 27 mmol/L (ref 22–32)
Calcium: 9 mg/dL (ref 8.9–10.3)
Chloride: 103 mmol/L (ref 98–111)
Creatinine, Ser: 0.45 mg/dL (ref 0.44–1.00)
GFR, Estimated: >60 mL/min (ref >60)
Glucose, Bld: 110 mg/dL — ABNORMAL HIGH (ref 70–99)
Potassium: 3.4 mmol/L — ABNORMAL LOW (ref 3.5–5.1)
Sodium: 139 mmol/L (ref 135–145)
Total Bilirubin: 1.4 mg/dL — ABNORMAL HIGH (ref 0.3–1.2)
Total Protein: 6.1 g/dL — ABNORMAL LOW (ref 6.5–8.1)

## 2021-07-13 LAB — URINALYSIS, ROUTINE W REFLEX MICROSCOPIC
Bilirubin Urine: NEGATIVE
Glucose, UA: NEGATIVE mg/dL
Hgb urine dipstick: NEGATIVE
Ketones, ur: NEGATIVE mg/dL
Leukocytes,Ua: NEGATIVE
Nitrite: NEGATIVE
Protein, ur: NEGATIVE mg/dL
Specific Gravity, Urine: 1.021 (ref 1.005–1.030)
pH: 7 (ref 5.0–8.0)

## 2021-07-13 LAB — CBC
HCT: 39 % (ref 36.0–46.0)
Hemoglobin: 12.9 g/dL (ref 12.0–15.0)
MCH: 30.6 pg (ref 26.0–34.0)
MCHC: 33.1 g/dL (ref 30.0–36.0)
MCV: 92.4 fL (ref 80.0–100.0)
Platelets: 175 10*3/uL (ref 150–400)
RBC: 4.22 MIL/uL (ref 3.87–5.11)
RDW: 13.2 % (ref 11.5–15.5)
WBC: 4.6 10*3/uL (ref 4.0–10.5)
nRBC: 0 % (ref 0.0–0.2)

## 2021-07-13 SURGERY — LAPAROSCOPIC CHOLECYSTECTOMY
Anesthesia: General

## 2021-07-13 MED ORDER — FENTANYL CITRATE PF 50 MCG/ML IJ SOSY
PREFILLED_SYRINGE | INTRAMUSCULAR | Status: AC
  Filled 2021-07-13: qty 1

## 2021-07-13 MED ORDER — ENOXAPARIN SODIUM 40 MG/0.4ML IJ SOSY: 40.0000 mg | PREFILLED_SYRINGE | INTRAMUSCULAR | Status: DC

## 2021-07-13 MED ORDER — FENTANYL CITRATE PF 50 MCG/ML IJ SOSY
25.0000 ug | PREFILLED_SYRINGE | INTRAMUSCULAR | Status: DC | PRN
  Administered 2021-07-13: 50 ug via INTRAVENOUS

## 2021-07-13 MED ORDER — ACETAMINOPHEN 10 MG/ML IV SOLN: 1000.0000 mg | Freq: Once | INTRAVENOUS | Status: DC | PRN

## 2021-07-13 MED ORDER — ROCURONIUM BROMIDE 10 MG/ML (PF) SYRINGE
PREFILLED_SYRINGE | INTRAVENOUS | Status: AC
Start: 2021-07-13 — End: ?
  Filled 2021-07-13: qty 10

## 2021-07-13 MED ORDER — SUGAMMADEX SODIUM 200 MG/2ML IV SOLN
INTRAVENOUS | Status: DC | PRN
  Administered 2021-07-13: 200 mg via INTRAVENOUS

## 2021-07-13 MED ORDER — ROCURONIUM BROMIDE 10 MG/ML (PF) SYRINGE
PREFILLED_SYRINGE | INTRAVENOUS | Status: DC | PRN
  Administered 2021-07-13: 60 mg via INTRAVENOUS

## 2021-07-13 MED ORDER — LACTATED RINGERS IV SOLN: INTRAVENOUS | Status: DC

## 2021-07-13 MED ORDER — SODIUM CHLORIDE 0.9 % IR SOLN
Status: DC | PRN
  Administered 2021-07-13: 1000 mL

## 2021-07-13 MED ORDER — FENTANYL CITRATE (PF) 100 MCG/2ML IJ SOLN
INTRAMUSCULAR | Status: DC | PRN
  Administered 2021-07-13: 100 ug via INTRAVENOUS

## 2021-07-13 MED ORDER — LACTATED RINGERS IR SOLN
Status: DC | PRN
  Administered 2021-07-13: 1000 mL

## 2021-07-13 MED ORDER — OXYCODONE HCL 5 MG/5ML PO SOLN: 5.0000 mg | Freq: Once | ORAL | Status: DC | PRN

## 2021-07-13 MED ORDER — ONDANSETRON HCL 4 MG/2ML IJ SOLN
INTRAMUSCULAR | Status: DC | PRN
  Administered 2021-07-13: 4 mg via INTRAVENOUS

## 2021-07-13 MED ORDER — METRONIDAZOLE 500 MG/100ML IV SOLN
500.0000 mg | Freq: Two times a day (BID) | INTRAVENOUS | Status: DC
  Administered 2021-07-13: 500 mg via INTRAVENOUS
  Filled 2021-07-13: qty 100

## 2021-07-13 MED ORDER — ONDANSETRON HCL 4 MG PO TABS: 4.0000 mg | ORAL_TABLET | Freq: Four times a day (QID) | ORAL | Status: DC | PRN

## 2021-07-13 MED ORDER — OXYCODONE HCL 5 MG PO TABS: 5.0000 mg | ORAL_TABLET | Freq: Once | ORAL | Status: DC | PRN

## 2021-07-13 MED ORDER — LIDOCAINE HCL (PF) 2 % IJ SOLN
INTRAMUSCULAR | Status: AC
  Filled 2021-07-13: qty 5

## 2021-07-13 MED ORDER — DEXAMETHASONE SODIUM PHOSPHATE 10 MG/ML IJ SOLN
INTRAMUSCULAR | Status: AC
  Filled 2021-07-13: qty 1

## 2021-07-13 MED ORDER — POTASSIUM CHLORIDE 10 MEQ/100ML IV SOLN
10.0000 meq | INTRAVENOUS | Status: AC
  Administered 2021-07-13 (×3): 10 meq via INTRAVENOUS
  Filled 2021-07-13: qty 100

## 2021-07-13 MED ORDER — PROPOFOL 10 MG/ML IV BOLUS
INTRAVENOUS | Status: DC | PRN
  Administered 2021-07-13: 100 mg via INTRAVENOUS

## 2021-07-13 MED ORDER — POTASSIUM CHLORIDE 10 MEQ/100ML IV SOLN
INTRAVENOUS | Status: AC
  Administered 2021-07-13: 10 meq via INTRAVENOUS
  Filled 2021-07-13: qty 300

## 2021-07-13 MED ORDER — PROPOFOL 10 MG/ML IV BOLUS
INTRAVENOUS | Status: AC
  Filled 2021-07-13: qty 20

## 2021-07-13 MED ORDER — ACETAMINOPHEN 325 MG PO TABS: 650.0000 mg | ORAL_TABLET | Freq: Four times a day (QID) | ORAL | Status: DC | PRN

## 2021-07-13 MED ORDER — MORPHINE SULFATE (PF) 2 MG/ML IV SOLN
1.0000 mg | INTRAVENOUS | Status: DC | PRN
  Administered 2021-07-13: 2 mg via INTRAVENOUS
  Filled 2021-07-13: qty 1

## 2021-07-13 MED ORDER — LIDOCAINE 2% (20 MG/ML) 5 ML SYRINGE
INTRAMUSCULAR | Status: DC | PRN
  Administered 2021-07-13: 100 mg via INTRAVENOUS

## 2021-07-13 MED ORDER — ONDANSETRON HCL 4 MG/2ML IJ SOLN
INTRAMUSCULAR | Status: AC
  Filled 2021-07-13: qty 2

## 2021-07-13 MED ORDER — PHENYLEPHRINE HCL-NACL 20-0.9 MG/250ML-% IV SOLN
INTRAVENOUS | Status: AC
  Filled 2021-07-13: qty 250

## 2021-07-13 MED ORDER — ONDANSETRON HCL 4 MG/2ML IJ SOLN: 4.0000 mg | Freq: Four times a day (QID) | INTRAMUSCULAR | Status: DC | PRN

## 2021-07-13 MED ORDER — CIPROFLOXACIN IN D5W 400 MG/200ML IV SOLN: 400.0000 mg | Freq: Two times a day (BID) | INTRAVENOUS | Status: DC

## 2021-07-13 MED ORDER — PHENYLEPHRINE 80 MCG/ML (10ML) SYRINGE FOR IV PUSH (FOR BLOOD PRESSURE SUPPORT)
PREFILLED_SYRINGE | INTRAVENOUS | Status: DC | PRN
  Administered 2021-07-13 (×3): 160 ug via INTRAVENOUS

## 2021-07-13 MED ORDER — ACETAMINOPHEN 650 MG RE SUPP: 650.0000 mg | Freq: Four times a day (QID) | RECTAL | Status: DC | PRN

## 2021-07-13 MED ORDER — AMITRIPTYLINE HCL 10 MG PO TABS
10.0000 mg | ORAL_TABLET | Freq: Every day | ORAL | Status: DC
  Filled 2021-07-13 (×2): qty 1

## 2021-07-13 MED ORDER — FENTANYL CITRATE (PF) 100 MCG/2ML IJ SOLN
INTRAMUSCULAR | Status: AC
  Filled 2021-07-13: qty 2

## 2021-07-13 MED ORDER — HYDROCODONE-ACETAMINOPHEN 5-325 MG PO TABS
1.0000 | ORAL_TABLET | Freq: Four times a day (QID) | ORAL | Status: DC | PRN
  Administered 2021-07-13: 1 via ORAL
  Filled 2021-07-13: qty 1

## 2021-07-13 MED ORDER — LOSARTAN POTASSIUM 50 MG PO TABS
50.0000 mg | ORAL_TABLET | Freq: Every day | ORAL | Status: DC
  Administered 2021-07-13: 50 mg via ORAL
  Filled 2021-07-13 (×2): qty 1

## 2021-07-13 MED ORDER — BUPIVACAINE HCL (PF) 0.5 % IJ SOLN
INTRAMUSCULAR | Status: DC | PRN
  Administered 2021-07-13: 20 mL

## 2021-07-13 MED ORDER — BUPIVACAINE HCL (PF) 0.5 % IJ SOLN
INTRAMUSCULAR | Status: AC
Start: 2021-07-13 — End: ?
  Filled 2021-07-13: qty 30

## 2021-07-13 MED ORDER — ONDANSETRON HCL 4 MG/2ML IJ SOLN: 4.0000 mg | Freq: Once | INTRAMUSCULAR | Status: DC | PRN

## 2021-07-13 MED ORDER — DEXAMETHASONE SODIUM PHOSPHATE 10 MG/ML IJ SOLN
INTRAMUSCULAR | Status: DC | PRN
  Administered 2021-07-13: 4 mg via INTRAVENOUS

## 2021-07-13 SURGICAL SUPPLY — 33 items
APPLIER CLIP 5 13 M/L LIGAMAX5 (MISCELLANEOUS) ×2
BAG COUNTER SPONGE SURGICOUNT (BAG) ×1 IMPLANT
BAG RETRIEVAL 10 (BASKET) ×1
CABLE HIGH FREQUENCY MONO STRZ (ELECTRODE) ×2 IMPLANT
CHLORAPREP W/TINT 26 (MISCELLANEOUS) ×2 IMPLANT
CLIP APPLIE 5 13 M/L LIGAMAX5 (MISCELLANEOUS) ×1 IMPLANT
COVER MAYO STAND XLG (MISCELLANEOUS) ×2 IMPLANT
DERMABOND ADVANCED (GAUZE/BANDAGES/DRESSINGS) ×1
DERMABOND ADVANCED .7 DNX12 (GAUZE/BANDAGES/DRESSINGS) ×1 IMPLANT
DRAPE C-ARM 42X120 X-RAY (DRAPES) IMPLANT
ELECT REM PT RETURN 15FT ADLT (MISCELLANEOUS) ×2 IMPLANT
GLOVE BIO SURGEON STRL SZ7.5 (GLOVE) ×2 IMPLANT
GOWN STRL REUS W/ TWL XL LVL3 (GOWN DISPOSABLE) ×2 IMPLANT
GOWN STRL REUS W/TWL XL LVL3 (GOWN DISPOSABLE) ×4
HEMOSTAT SURGICEL 4X8 (HEMOSTASIS) IMPLANT
IRRIG SUCT STRYKERFLOW 2 WTIP (MISCELLANEOUS) ×2
IRRIGATION SUCT STRKRFLW 2 WTP (MISCELLANEOUS) ×1 IMPLANT
KIT BASIN OR (CUSTOM PROCEDURE TRAY) ×2 IMPLANT
KIT TURNOVER KIT A (KITS) ×1 IMPLANT
PENCIL SMOKE EVACUATOR (MISCELLANEOUS) IMPLANT
SCISSORS LAP 5X35 DISP (ENDOMECHANICALS) ×2 IMPLANT
SET CHOLANGIOGRAPH MIX (MISCELLANEOUS) IMPLANT
SET TUBE SMOKE EVAC HIGH FLOW (TUBING) ×2 IMPLANT
SLEEVE Z-THREAD 5X100MM (TROCAR) ×4 IMPLANT
SPIKE FLUID TRANSFER (MISCELLANEOUS) ×2 IMPLANT
SUT MNCRL AB 4-0 PS2 18 (SUTURE) ×2 IMPLANT
SYS BAG RETRIEVAL 10MM (BASKET) ×1
SYSTEM BAG RETRIEVAL 10MM (BASKET) ×1 IMPLANT
TOWEL OR 17X26 10 PK STRL BLUE (TOWEL DISPOSABLE) ×2 IMPLANT
TOWEL OR NON WOVEN STRL DISP B (DISPOSABLE) ×2 IMPLANT
TRAY LAPAROSCOPIC (CUSTOM PROCEDURE TRAY) ×2 IMPLANT
TROCAR BALLN 12MMX100 BLUNT (TROCAR) ×2 IMPLANT
TROCAR Z-THREAD OPTICAL 5X100M (TROCAR) ×2 IMPLANT

## 2021-07-13 NOTE — Progress Notes (Signed)
Care started prior to midnight in the emergency room and patient was admitted early this morning after midnight by Dr. Jennette Kettle and I am in current agreement with his assessment and plan.  Additional changes to the plan of care been made accordingly.  The patient is an 87 year old Caucasian female with a past medical history significant for but limited to hypertension, hyperlipidemia, nonobstructive CAD, lumbar radiculopathy status post decompression and fusion the back in 2022 who presented with epigastric and right upper quadrant abdominal pain.  Her symptoms started about 4 days ago and have been intermittent.  They occur after eating and abdominal discomfort and pain is better when she is not eating.  She has had associated nausea and vomiting.  She had not right upper quadrant ultrasound that showed gallstones but no features of cholecystitis but she had a CT of the abdomen pelvis which showed mild gallbladder wall thickening and edema and mild anterior bladder wall thickening that represents underdistention or cystitis.  Is noted that she also had a sigmoid colon diverticulosis without evidence of diverticulitis and aortic atherosclerosis.  She is admitted for biliary colic and general surgery has been consulted and awaiting further evaluation recommendations.  She has been initiated on IV antibiotics with Cipro/Flagyl and has been made NPO.  Her bilirubin is still slightly elevated at 1.4 and potassium is now low at 3.4. Currently she is being admitted and treated for the following but not limited too:  Biliary colic in the setting of Cholelithiasis with Acute Cholecystitis  Hyperbilirubinemia Management per general surgery. NPO for Surgery  IVF Repeat CMP in AM Call GI if LFTs elevating, but normal on presentation despite 4 days of symptoms. Cipro / flagyl (has PCN allergy) Regarding operative risk: Had uncomplicated spinal surgery in dec H/o CAD, non-occlusive on past LHC, has chronic  exertional dyspnea so had stress test in May 2022, which was very much negative for any suggestion of ischemia. CXR today shows chronically increased lung volumes but no acute findings EKG today is essentially unchanged from priors. Overall pt likely acceptable risk for lap-chole if indicated. Gupta score 0.2% Will check UA as well, but again, HPI sounds much more suspicious for biliary colic.   Chronic back pain -Continue norco, elavil, and lyrica.   Essential hypertension -Hold HCTZ -Cont ARB -Continue to Monitor BP per Protocol    Coronary artery disease involving native coronary artery without angina pectoris H/o CAD, non-occlusive on past LHC, has chronic exertional dyspnea so had stress test in May 2022, which was very much negative for any suggestion of ischemia. -Hold ASA and resume when ok with Surgery    Hypokalemia -Mild. K+ is now 3.4 -Replete with IV Kcl 40 mEQ x1 -Continue to Monitor and Replete as Necessary -Repeat CMP in the AM   We will continue to monitor the patient's clinical response to intervention and repeat blood work and imaging in the a.m.

## 2021-07-13 NOTE — Anesthesia Procedure Notes (Addendum)
Procedure Name: Intubation Date/Time: 07/13/2021 11:02 AM Performed by: Rosaland Lao, CRNA Pre-anesthesia Checklist: Patient identified, Emergency Drugs available, Suction available and Patient being monitored Patient Re-evaluated:Patient Re-evaluated prior to induction Oxygen Delivery Method: Circle system utilized Preoxygenation: Pre-oxygenation with 100% oxygen Induction Type: IV induction Ventilation: Oral airway inserted - appropriate to patient size Laryngoscope Size: Glidescope Grade View: Grade I Tube type: Oral Tube size: 7.0 mm Number of attempts: 1 Airway Equipment and Method: Stylet and Oral airway Placement Confirmation: ETT inserted through vocal cords under direct vision, positive ETCO2 and breath sounds checked- equal and bilateral Tube secured with: Tape Dental Injury: Teeth and Oropharynx as per pre-operative assessment  Difficulty Due To: Difficulty was anticipated and Difficult Airway- due to anterior larynx Comments: DL with miller 2 grade 3 view, switched to glidescope for successful intubation

## 2021-07-13 NOTE — TOC Initial Note (Signed)
Transition of Care [facility]) - Initial/Assessment Note    Patient Details  Name: Belinda Brown MRN: [id number] Date of Birth: 11-17-1934  Transition of Care [facility]) CM/SW Contact:    Leeroy Cha, RN Phone Number: 07/13/2021, 9:10 AM  Clinical Narrative:                  Transition of Care [facility]) Screening Note   Patient Details  Name: Belinda Brown Date of Birth: 10-10-1934   Transition of Care [facility]) CM/SW Contact:    Leeroy Cha, RN Phone Number: 07/13/2021, 9:10 AM    Transition of Care Department [facility] has reviewed patient and no TOC needs have been identified at this time. We will continue to monitor patient advancement through interdisciplinary progression rounds. If new patient transition needs arise, please place a TOC consult.    Expected Discharge Plan: Home/Self Care Barriers to Discharge: No Barriers Identified   Patient Goals and CMS Choice Patient states their goals for this hospitalization and ongoing recovery are:: to go home CMS Medicare.gov Compare Post Acute Care list provided to:: Patient Choice offered to / list presented to : Patient  Expected Discharge Plan and Services Expected Discharge Plan: Home/Self Care   Discharge Planning Services: CM Consult   Living arrangements for the past 2 months: Single Family Home                                      Prior Living Arrangements/Services Living arrangements for the past 2 months: Single Family Home Lives with:: Self Patient language and need for interpreter reviewed:: Yes Do you feel safe going back to the place where you live?: Yes            Criminal Activity/Legal Involvement Pertinent to Current Situation/Hospitalization: No - Comment as needed  Activities of Daily Living Home Assistive Devices/Equipment: Cane (specify quad or straight) (at times) ADL Screening (condition at time of admission) Patient's cognitive ability adequate to safely complete daily activities?:  Yes Is the patient deaf or have difficulty hearing?: No Does the patient have difficulty seeing, even when wearing glasses/contacts?: No Does the patient have difficulty concentrating, remembering, or making decisions?: No Patient able to express need for assistance with ADLs?: Yes Does the patient have difficulty dressing or bathing?: No Independently performs ADLs?: Yes (appropriate for developmental age) Does the patient have difficulty walking or climbing stairs?: No Weakness of Legs: Left Weakness of Arms/Hands: None  Permission Sought/Granted                  Emotional Assessment Appearance:: Appears stated age     Orientation: : Oriented to Self, Oriented to  Time, Oriented to Place, Oriented to Situation Alcohol / Substance Use: Not Applicable Psych Involvement: No (comment)  Admission diagnosis:  Epigastric pain [R10.13] RUQ abdominal pain [R10.11] Abdominal pain [R10.9] Thickening of wall of gallbladder [K82.8] Chest pain, unspecified type [R07.9] Patient Active Problem List   Diagnosis Date Noted   Biliary colic 78/29/5621   Chronic back pain 07/13/2021   Lumbar radiculopathy 01/14/2021   Lumbar stenosis with neurogenic claudication 12/24/2017   Nonobstructive atherosclerosis of coronary artery 06/05/2016   DOE (dyspnea on exertion) 06/02/2016   H/O thymoma 06/02/2016   Atypical chest pain 06/02/2016   Fatigue 06/02/2016   Near syncope 06/02/2016   Thymoma    Hypercholesteremia    Coronary artery disease involving native coronary artery without  angina pectoris    Essential hypertension    PCP:  Cyndi Bender, PA-C Pharmacy:   [place], [place] - [address] [place] [address] Phone: [phone] Fax: [phone]     Social Determinants of Health (SDOH) Interventions    Readmission Risk Interventions     View : No data to display.

## 2021-07-13 NOTE — Assessment & Plan Note (Addendum)
1. Management per general surgery. 2. NPO 3. IVF 4. Repeat CMP in AM 1. Call GI if LFTs elevating, but normal on presentation despite 4 days of symptoms. 5. Cipro / flagyl (has PCN allergy) 6. Regarding operative risk: 1. Had uncomplicated spinal surgery in dec 2. H/o CAD, non-occlusive on past LHC, has chronic exertional dyspnea so had stress test in May 2022, which was very much negative for any suggestion of ischemia. 3. CXR today shows chronically increased lung volumes but no acute findings 4. EKG today is essentially unchanged from priors. 5. Overall pt likely acceptable risk for lap-chole if indicated. 1. Gupta score 0.2% 7. Will check UA as well, but again, HPI sounds much more suspicious for biliary colic.

## 2021-07-13 NOTE — Consult Note (Signed)
Belinda Brown [birth year]  [id number].    Requesting MD: Dr. Raiford Noble Chief Complaint/Reason for Consult: biliary colic  HPI:  This is a very pleasant 87 yo white female with a history of HTN, nonobstructive CAD, OSA, and HLD who recently underwent back surgery and had a full cardiac work up prior to this.  She did well with this surgery back in December.  She states for a "while now" she has had intermittent episodes after eating where she would develop epigastric abdominal pain with some nausea.  She would walk the floor and this would improved.  However, on Friday, she had another episode but it was worse.  It did still resolve.  It returned yesterday morning after eating a piece of pimento cheese toast and a blueberry muffin.  She developed N/V and significant epigastric pain that radiated across her upper abdomen.  It settled a little bit and she tried some milk and crackers to settle it down more and it made it worse again.    She then presented to the [facility] ED where she was found to have gallstones, but no WBC or significant elevation in herLFTs except mild TB elevation at 1.4.  no evidence of cholecystitis was noted on [place], just stones.  Her story was c/w biliary colic and she was admitted for surgical evaluation.  ROS: ROS: Please see HPI  Family History  Problem Relation Age of Onset   Congestive Heart Failure Mother    Heart attack Father    Cancer Sister    COPD Brother    Heart failure Maternal Grandmother    Pneumonia Maternal Grandfather    Heart failure Paternal Grandmother    Heart failure Paternal Grandfather     Past Medical History:  Diagnosis Date   Arthritis    back & L thumb   Degenerative joint disease (DJD) of lumbar spine 12/2017   Had lumbar decompression and fusion (L4-L5). ->  Did note some improvement like her back pain but still struggling with back pain and fatigue.  Not back to her previous baseline.   Hypercholesteremia    Hypertension     Nonobstructive atherosclerosis of coronary artery 2008   Nonobstructive disease by cath   OAB (overactive bladder)    Mostly at night.  She takes oxybutynin but no significant improvement.   Obstructive sleep apnea, moderate: ASI of 25 and 22 on home sleep study. 10/08/2018   Has tried 2 different masks-could not keep him on because he feels restrictive.   Thymoma 11/24/2005   s/p resection of thymic mass    Past Surgical History:  Procedure Laterality Date   CARDIAC CATHETERIZATION  2008   [facility]. - Dr. Geraldo Pitter -- non-obstructive CAD (by pt report - ~50% single vessel)   CERVICAL LAMINECTOMY  1986   EYE SURGERY     cataracts removed- bilateral    LUMBAR LAMINECTOMY/DECOMPRESSION MICRODISCECTOMY Left 01/14/2021   Procedure: Laminectomy and Foraminotomy - left - - Lumbar two-Lumbar three;  Surgeon: Earnie Larsson, MD;  Location: [facility];  Service: Neurosurgery;  Laterality: Left;   NM MYOVIEW LTD  06/23/2016   EF 30-35% with severe hypokinesis of the distal septal, distal inferior, mid/distal anterior, mid/distal anterolateral walls and Akinesis of the apex. The cavity size was normal. Wall thickness was normal. Trivial Aortic regurgitation. Mitral Mild MR   partial mediastinoscopy with thymectomy and resection of thymic mass  11/24/2005   [place]   TRANSTHORACIC ECHOCARDIOGRAM  06/19/2016   Normal  LV size and function. EF 55-60%. GR 1 DD. No regional wall motion abnormalities. Mild (grade 1) DD. Mild LVH and trace AI,MR,TR   Treadmill Stress Echo  2016   [facility]: report not avaialble.  Pt reports - Normal result   TUBAL LIGATION      Social History:  reports that she has never smoked. She has never used smokeless tobacco. She reports current alcohol use. She reports that she does not use drugs.  Allergies:  Allergies  Allergen Reactions   Ampicillin Hives    Has patient had a PCN reaction causing immediate rash, facial/tongue/throat swelling, SOB or lightheadedness with  hypotension: Yes Has patient had a PCN reaction causing severe rash involving mucus membranes or skin necrosis: No Has patient had a PCN reaction that required hospitalization: No Has patient had a PCN reaction occurring within the last 10 years: No If all of the above answers are "NO", then may proceed with Cephalosporin use.    Codeine Other (See Comments)    hallucinations Other reaction(s): hives/confusion    Medications Prior to Admission  Medication Sig Dispense Refill   acetaminophen (TYLENOL) 500 MG tablet Take 1,000 mg by mouth every 6 (six) hours as needed for mild pain.     aspirin EC 81 MG tablet Take 81 mg by mouth daily.     cholecalciferol (VITAMIN D) 1000 units tablet Take 1,000 Units by mouth daily.     hydrochlorothiazide (HYDRODIURIL) 25 MG tablet Take 25 mg by mouth daily.  0   loratadine (CLARITIN) 10 MG tablet Take 10 mg by mouth daily as needed for allergies.     losartan (COZAAR) 50 MG tablet Take 50 mg by mouth daily.  0   pregabalin (LYRICA) 75 MG capsule Take 1 capsule (75 mg total) by mouth 2 (two) times daily. 30 capsule 0   Turmeric 500 MG TABS Take 500 mg by mouth daily.     vitamin B-12 (CYANOCOBALAMIN) 500 MCG tablet Take 500 mcg by mouth daily.     vitamin C (ASCORBIC ACID) 500 MG tablet Take 500 mg by mouth daily.     amitriptyline (ELAVIL) 10 MG tablet Take 10 mg by mouth at bedtime. (Patient not taking: Reported on 07/13/2021)     HYDROcodone-acetaminophen (NORCO/VICODIN) 5-325 MG tablet Take 2 tablets by mouth every 4 (four) hours as needed. (Patient not taking: Reported on 07/13/2021) 10 tablet 0     Physical Exam: Blood pressure 112/64, pulse 62, temperature 97.6 F (36.4 C), temperature source Oral, resp. rate 16, height '5\' 5"'$  (1.651 m), weight 61.2 kg, SpO2 96 %. General: pleasant, WD, WN white female who is laying in bed in NAD HEENT: head is normocephalic, atraumatic.  Sclera are noninjected.  PERRL.  Ears and nose without any masses or  lesions.  Mouth is pink and moist Heart: regular, rate, and rhythm.  Normal s1,s2. No obvious murmurs, gallops, or rubs noted.  Palpable radial and pedal pulses bilaterally Lungs: CTAB, no wheezes, rhonchi, or rales noted.  Respiratory effort nonlabored Abd: soft, NT, ND, +BS, no masses, hernias, or organomegaly Psych: A&Ox3 with an appropriate affect.   Results for orders placed or performed during the hospital encounter of 07/12/21 (from the past 48 hour(s))  CBC with Differential     Status: None   Collection Time: 07/12/21  4:33 PM  Result Value Ref Range   WBC 7.9 4.0 - 10.5 K/uL   RBC 4.66 3.87 - 5.11 MIL/uL   Hemoglobin 14.7 12.0 - 15.0  g/dL   HCT 42.8 36.0 - 46.0 %   MCV 91.8 80.0 - 100.0 fL   MCH 31.5 26.0 - 34.0 pg   MCHC 34.3 30.0 - 36.0 g/dL   RDW 13.2 11.5 - 15.5 %   Platelets 198 150 - 400 K/uL   nRBC 0.0 0.0 - 0.2 %   Neutrophils Relative % 76 %   Neutro Abs 6.0 1.7 - 7.7 K/uL   Lymphocytes Relative 14 %   Lymphs Abs 1.1 0.7 - 4.0 K/uL   Monocytes Relative 8 %   Monocytes Absolute 0.7 0.1 - 1.0 K/uL   Eosinophils Relative 1 %   Eosinophils Absolute 0.1 0.0 - 0.5 K/uL   Basophils Relative 1 %   Basophils Absolute 0.0 0.0 - 0.1 K/uL   Immature Granulocytes 0 %   Abs Immature Granulocytes 0.02 0.00 - 0.07 K/uL    Comment: Performed at [facility], [address] [place], [place] [address]  Comprehensive metabolic panel     Status: Abnormal   Collection Time: 07/12/21  4:33 PM  Result Value Ref Range   Sodium 136 135 - 145 mmol/L   Potassium 3.5 3.5 - 5.1 mmol/L   Chloride 98 98 - 111 mmol/L   CO2 29 22 - 32 mmol/L   Glucose, Bld 126 (H) 70 - 99 mg/dL    Comment: Glucose reference range applies only to samples taken after fasting for at least 8 hours.   BUN 16 8 - 23 mg/dL   Creatinine, Ser 0.68 0.44 - 1.00 mg/dL   Calcium 9.7 8.9 - 10.3 mg/dL   Total Protein 7.1 6.5 - 8.1 g/dL   Albumin 4.3 3.5 - 5.0 g/dL   AST 18 15 - 41 U/L   ALT 14 0 - 44  U/L   Alkaline Phosphatase 68 38 - 126 U/L   Total Bilirubin 1.4 (H) 0.3 - 1.2 mg/dL   GFR, Estimated >60 >60 mL/min    Comment: (NOTE) Calculated using the CKD-EPI Creatinine Equation (2021)    Anion gap 9 5 - 15    Comment: Performed at [facility], [address] [place], [place] [address]  Lipase, blood     Status: None   Collection Time: 07/12/21  4:33 PM  Result Value Ref Range   Lipase 34 11 - 51 U/L    Comment: Performed at [facility], [address] [place], [place] [address]  Troponin I (High Sensitivity)     Status: None   Collection Time: 07/12/21  4:33 PM  Result Value Ref Range   Troponin I (High Sensitivity) 5 <18 ng/L    Comment: (NOTE) Elevated high sensitivity troponin I (hsTnI) values and significant  changes across serial measurements may suggest ACS but many other  chronic and acute conditions are known to elevate hsTnI results.  Refer to the "Links" section for chest pain algorithms and additional  guidance. Performed at [facility], [address] [place], [place] [address]   Lactic acid, plasma     Status: None   Collection Time: 07/12/21  5:20 PM  Result Value Ref Range   Lactic Acid, Venous 0.8 0.5 - 1.9 mmol/L    Comment: Performed at [facility], [address] [place], [place] [address]  Lactic acid, plasma     Status: None   Collection Time: 07/12/21  7:36 PM  Result Value Ref Range   Lactic Acid, Venous  0.7 0.5 - 1.9 mmol/L    Comment: Performed at [facility], [address] [place], [place] [address]  Troponin I (High Sensitivity)     Status: None   Collection Time: 07/12/21  7:36 PM  Result Value Ref Range   Troponin I (High Sensitivity) 5 <18 ng/L    Comment: (NOTE) Elevated high sensitivity troponin I (hsTnI) values and significant  changes across serial measurements may suggest ACS but many other  chronic and acute conditions are known to elevate hsTnI results.  Refer to  the "Links" section for chest pain algorithms and additional  guidance. Performed at [facility], [address] [place], [place] [address]   Urinalysis, Routine w reflex microscopic Urine, Clean Catch     Status: Abnormal   Collection Time: 07/13/21  2:42 AM  Result Value Ref Range   Color, Urine STRAW (A) YELLOW   APPearance CLEAR CLEAR   Specific Gravity, Urine 1.021 1.005 - 1.030   pH 7.0 5.0 - 8.0   Glucose, UA NEGATIVE NEGATIVE mg/dL   Hgb urine dipstick NEGATIVE NEGATIVE   Bilirubin Urine NEGATIVE NEGATIVE   Ketones, ur NEGATIVE NEGATIVE mg/dL   Protein, ur NEGATIVE NEGATIVE mg/dL   Nitrite NEGATIVE NEGATIVE   Leukocytes,Ua NEGATIVE NEGATIVE    Comment: Performed at [facility], [place] [address], [place], [place] [address]  CBC     Status: None   Collection Time: 07/13/21  4:18 AM  Result Value Ref Range   WBC 4.6 4.0 - 10.5 K/uL   RBC 4.22 3.87 - 5.11 MIL/uL   Hemoglobin 12.9 12.0 - 15.0 g/dL   HCT 39.0 36.0 - 46.0 %   MCV 92.4 80.0 - 100.0 fL   MCH 30.6 26.0 - 34.0 pg   MCHC 33.1 30.0 - 36.0 g/dL   RDW 13.2 11.5 - 15.5 %   Platelets 175 150 - 400 K/uL   nRBC 0.0 0.0 - 0.2 %    Comment: Performed at [facility], [place] [address], [place], [place] [address]  Comprehensive metabolic panel     Status: Abnormal   Collection Time: 07/13/21  4:18 AM  Result Value Ref Range   Sodium 139 135 - 145 mmol/L   Potassium 3.4 (L) 3.5 - 5.1 mmol/L   Chloride 103 98 - 111 mmol/L   CO2 27 22 - 32 mmol/L   Glucose, Bld 110 (H) 70 - 99 mg/dL    Comment: Glucose reference range applies only to samples taken after fasting for at least 8 hours.   BUN 13 8 - 23 mg/dL   Creatinine, Ser 0.45 0.44 - 1.00 mg/dL   Calcium 9.0 8.9 - 10.3 mg/dL   Total Protein 6.1 (L) 6.5 - 8.1 g/dL   Albumin 3.7 3.5 - 5.0 g/dL   AST 15 15 - 41 U/L   ALT 11 0 - 44 U/L   Alkaline Phosphatase 56 38 - 126 U/L   Total Bilirubin 1.4 (H) 0.3 - 1.2 mg/dL   GFR,  Estimated >60 >60 mL/min    Comment: (NOTE) Calculated using the CKD-EPI Creatinine Equation (2021)    Anion gap 9 5 - 15    Comment: Performed at [facility], [place] [address] [place], [place] [address]   DG Chest 2 View  Result Date: 07/12/2021 CLINICAL DATA:  Chest and epigastric pain EXAM: CHEST - 2 VIEW COMPARISON:  08/12/2018 FINDINGS: Normal heart size, mediastinal contours, and pulmonary vascularity.  Prior median sternotomy. Lungs hyperinflated but clear. No pulmonary infiltrate, pleural effusion, or pneumothorax. Bones demineralized. IMPRESSION: Hyperinflated lungs without acute infiltrate. Electronically Signed   By: Lavonia Dana M.D.   On: 07/12/2021 17:05   CT ABDOMEN PELVIS W CONTRAST  Result Date: 07/12/2021 CLINICAL DATA:  Upper abdominal pain. EXAM: CT ABDOMEN AND PELVIS WITH CONTRAST TECHNIQUE: Multidetector CT imaging of the abdomen and pelvis was performed using the standard protocol following bolus administration of intravenous contrast. RADIATION DOSE REDUCTION: This exam was performed according to the departmental dose-optimization program which includes automated exposure control, adjustment of the mA and/or kV according to patient size and/or use of iterative reconstruction technique. CONTRAST:  195m OMNIPAQUE IOHEXOL 300 MG/ML  SOLN COMPARISON:  Abdominal ultrasound 07/12/2021. CT abdomen and pelvis 10/31/2016. FINDINGS: Lower chest: No acute abnormality. Hepatobiliary: Mild diffuse gallbladder wall thickening/edema. This was not well appreciated on ultrasound. No surrounding inflammation or biliary ductal dilatation. Liver is within normal limits. Pancreas: Unremarkable. No pancreatic ductal dilatation or surrounding inflammatory changes. Spleen: Normal in size without focal abnormality. Adrenals/Urinary Tract: There is a rounded hypodensity in the left kidney which is too small to characterize and unchanged, favored as a small cyst. Otherwise, the adrenal  glands and kidneys are within normal limits. There is mild diffuse bladder wall thickening anteriorly. No bladder calculi are seen. Stomach/Bowel: Small hiatal hernia is present. Stomach is otherwise within normal limits. Appendix appears normal. No evidence of bowel wall thickening, distention, or inflammatory changes. There is sigmoid colon diverticulosis without evidence for acute diverticulitis. Vascular/Lymphatic: Aortic atherosclerosis. No enlarged abdominal or pelvic lymph nodes. Reproductive: Uterus and bilateral adnexa are unremarkable. Other: No abdominal wall hernia or abnormality. No abdominopelvic ascites. Musculoskeletal: L3-L5 posterior fusion hardware is present. Multilevel degenerative changes affect the spine. IMPRESSION: 1. Mild gallbladder wall thickening/edema. Please correlate clinically. 2. Mild anterior bladder wall thickening may represent under distension or cystitis. Please correlate clinically. 3. Small hiatal hernia. 4. Sigmoid colon diverticulosis without evidence for diverticulitis. 5.  Aortic Atherosclerosis (ICD10-I70.0). Electronically Signed   By: ARonney AstersM.D.   On: 07/12/2021 22:38   [place]Abdomen Limited RUQ (LIVER/GB)  Result Date: 07/12/2021 CLINICAL DATA:  Epigastric pain. EXAM: ULTRASOUND ABDOMEN LIMITED RIGHT UPPER QUADRANT COMPARISON:  CT abdomen pelvis dated October 31, 2016. FINDINGS: Gallbladder: Multiple tiny gallstones. No wall thickening visualized. No sonographic Murphy sign noted by sonographer. Common bile duct: Diameter: 4 mm, normal. Liver: No focal lesion identified. Mildly increased in parenchymal echogenicity. Portal vein is patent on color Doppler imaging with normal direction of blood flow towards the liver. Other: None. IMPRESSION: 1. No acute abnormality. 2. Cholelithiasis. Electronically Signed   By: WTitus DubinM.D.   On: 07/12/2021 17:28      Assessment/Plan Biliary colic The patient has been seen, examined, labs, chart, vitals, etc  have been reviewed.  She seems to have biliary colic.  I think she would benefit from a lap chole to remove the source.  We will plan to proceed to the OR today for this procedure.  She was cleared by cardiology in December of 2022 for her back surgery and did great with that and had no issues.  No further work up needed at this time.  I have explained the procedure, risks, and aftercare of cholecystectomy.  Risks include but are not limited to bleeding, infection, wound problems, anesthesia, diarrhea, bile leak, injury to common bile duct/liver/intestine.  She seems to understand and agrees to proceed.  FEN - NPO/IVFs VTE -  can start to follow ID - cipro/Flagyl, will not need more post op  HTN HLD Chronic neuralgia from shingles in her LLE Nonobstructive CAD OSA  I reviewed ED provider notes, hospitalist notes, last 24 h vitals and pain scores, last 48 h intake and output, last 24 h labs and trends, and last 24 h imaging results.  Henreitta Cea, [facility] 07/13/2021, 9:39 AM Please see Amion for pager number during day hours 7:00am-4:30pm or 7:00am -11:30am on weekends

## 2021-07-13 NOTE — H&P (Signed)
History and Physical    Patient: Belinda Brown HKV:[id number] DOB: 10-Jun-1934 DOA: 07/12/2021 DOS: the patient was seen and examined on 07/13/2021 PCP: Cyndi Bender, PA-C  Patient coming from: Home  Chief Complaint:  Chief Complaint  Patient presents with   Chest Pain   HPI: NYIA [name] is a 87 y.o. female with medical history significant of HTN, HLD, non-occlusive CAD, lumbar radiculopathy s/p decompression and fusion back in Dec 2022.  Pt presents to ED with c/o epigastric and RUQ abd pain.  Symptoms onset 4 days ago, intermittent.  Occurs after eating, better when not eating.  Associated nausea and vomiting.   Review of Systems: As mentioned in the history of present illness. All other systems reviewed and are negative. Past Medical History:  Diagnosis Date   Arthritis    back & L thumb   Degenerative joint disease (DJD) of lumbar spine 12/2017   Had lumbar decompression and fusion (L4-L5). ->  Did note some improvement like her back pain but still struggling with back pain and fatigue.  Not back to her previous baseline.   Hypercholesteremia    Hypertension    Nonobstructive atherosclerosis of coronary artery 2008   Nonobstructive disease by cath   OAB (overactive bladder)    Mostly at night.  She takes oxybutynin but no significant improvement.   Obstructive sleep apnea, moderate: ASI of 25 and 22 on home sleep study. 10/08/2018   Has tried 2 different masks-could not keep him on because he feels restrictive.   Thymoma 11/24/2005   s/p resection of thymic mass   Past Surgical History:  Procedure Laterality Date   CARDIAC CATHETERIZATION  2008   [facility]. - Dr. Geraldo Pitter -- non-obstructive CAD (by pt report - ~50% single vessel)   CERVICAL LAMINECTOMY  1986   EYE SURGERY     cataracts removed- bilateral    LUMBAR LAMINECTOMY/DECOMPRESSION MICRODISCECTOMY Left 01/14/2021   Procedure: Laminectomy and Foraminotomy - left - - Lumbar two-Lumbar three;  Surgeon:  Earnie Larsson, MD;  Location: [facility];  Service: Neurosurgery;  Laterality: Left;   NM MYOVIEW LTD  06/23/2016   EF 30-35% with severe hypokinesis of the distal septal, distal inferior, mid/distal anterior, mid/distal anterolateral walls and Akinesis of the apex. The cavity size was normal. Wall thickness was normal. Trivial Aortic regurgitation. Mitral Mild MR   partial mediastinoscopy with thymectomy and resection of thymic mass  11/24/2005   [place]   TRANSTHORACIC ECHOCARDIOGRAM  06/19/2016   Normal LV size and function. EF 55-60%. GR 1 DD. No regional wall motion abnormalities. Mild (grade 1) DD. Mild LVH and trace AI,MR,TR   Treadmill Stress Echo  2016   [facility]: report not avaialble.  Pt reports - Normal result   TUBAL LIGATION     Social History:  reports that she has never smoked. She has never used smokeless tobacco. She reports current alcohol use. She reports that she does not use drugs.  Allergies  Allergen Reactions   Ampicillin Hives    Has patient had a PCN reaction causing immediate rash, facial/tongue/throat swelling, SOB or lightheadedness with hypotension: Yes Has patient had a PCN reaction causing severe rash involving mucus membranes or skin necrosis: No Has patient had a PCN reaction that required hospitalization: No Has patient had a PCN reaction occurring within the last 10 years: No If all of the above answers are "NO", then may proceed with Cephalosporin use.    Codeine Other (See Comments)    hallucinations  Other reaction(s): hives/confusion    Family History  Problem Relation Age of Onset   Congestive Heart Failure Mother    Heart attack Father    Cancer Sister    COPD Brother    Heart failure Maternal Grandmother    Pneumonia Maternal Grandfather    Heart failure Paternal Grandmother    Heart failure Paternal Grandfather     Prior to Admission medications   Medication Sig Start Date End Date Taking? Authorizing Provider  amitriptyline (ELAVIL) 10  MG tablet Take 10 mg by mouth at bedtime.    [provider]  aspirin EC 81 MG tablet Take 81 mg by mouth daily.    [provider]  cholecalciferol (VITAMIN D) 1000 units tablet Take 1,000 Units by mouth daily.    [provider]  hydrochlorothiazide (HYDRODIURIL) 25 MG tablet Take 25 mg by mouth daily. 10/02/17   [provider]  HYDROcodone-acetaminophen (NORCO/VICODIN) 5-325 MG tablet Take 2 tablets by mouth every 4 (four) hours as needed. Patient taking differently: Take 1 tablet by mouth every 6 (six) hours as needed for severe pain. 12/08/20   Sherrill Raring, PA-C  ibuprofen (ADVIL) 800 MG tablet Take 800 mg by mouth 3 (three) times daily.    [provider]  losartan (COZAAR) 50 MG tablet Take 50 mg by mouth daily. 10/02/17   [provider]  OVER THE COUNTER MEDICATION Apply 1 application topically 2 (two) times daily as needed (pain). [facility] Roll [facility] Pain Relief    [provider]  pregabalin (LYRICA) 75 MG capsule Take 1 capsule (75 mg total) by mouth 2 (two) times daily. Patient taking differently: Take 75 mg by mouth 3 (three) times daily. 12/08/20   Sherrill Raring, PA-C  vitamin B-12 (CYANOCOBALAMIN) 500 MCG tablet Take 500 mcg by mouth daily.    [provider]  vitamin C (ASCORBIC ACID) 500 MG tablet Take 500 mg by mouth daily.    [provider]    Physical Exam: Vitals:   07/12/21 2130 07/12/21 2200 07/13/21 0000 07/13/21 0101  BP: 131/64 (!) 147/94 136/76 (!) 142/82  Pulse: 81 76 73 67  Resp: (!) '24 14 14 20  '$ Temp:    98 F (36.7 C)  TempSrc:    Oral  SpO2: 98% 100% 97% 99%  Weight:      Height:       Constitutional: NAD, calm, comfortable Eyes: PERRL, lids and conjunctivae normal ENMT: Mucous membranes are moist. Posterior pharynx clear of any exudate or lesions.Normal dentition.  Neck: normal, supple, no masses, no thyromegaly Respiratory: clear to auscultation bilaterally, no wheezing, no  crackles. Normal respiratory effort. No accessory muscle use.  Cardiovascular: Regular rate and rhythm, no murmurs / rubs / gallops. No extremity edema. 2+ pedal pulses. No carotid bruits.  Abdomen: RUQ and epigastric TTP Musculoskeletal: no clubbing / cyanosis. No joint deformity upper and lower extremities. Good ROM, no contractures. Normal muscle tone.  Skin: no rashes, lesions, ulcers. No induration Neurologic: CN 2-12 grossly intact. Sensation intact, DTR normal. Strength 5/5 in all 4.  Psychiatric: Normal judgment and insight. Alert and oriented x 3. Normal mood.   Data Reviewed:    Stress test from May 2022: Conclusion: Exercise testing with gas exchange demonstrates normal (actually excellent) functional capacity when compared to matched sedentary norms. There is no indication for cardiopulmonary abnormality.   CMP     Component Value Date/Time   NA 136 07/12/2021 1633   K 3.5 07/12/2021 1633  CL 98 07/12/2021 1633   CO2 29 07/12/2021 1633   GLUCOSE 126 (H) 07/12/2021 1633   BUN 16 07/12/2021 1633   BUN 11.7 10/23/2016 1156   CREATININE 0.68 07/12/2021 1633   CREATININE 0.8 10/23/2016 1156   CALCIUM 9.7 07/12/2021 1633   PROT 7.1 07/12/2021 1633   ALBUMIN 4.3 07/12/2021 1633   AST 18 07/12/2021 1633   ALT 14 07/12/2021 1633   ALKPHOS 68 07/12/2021 1633   BILITOT 1.4 (H) 07/12/2021 1633   GFRNONAA >60 07/12/2021 1633   GFRAA >60 08/12/2018 1348   Trop negative x2  EKG = low voltage T waves diffusely but unchanged from prior.  RUQ [place] = cholelithiasis  CT AP with contrast = IMPRESSION: 1. Mild gallbladder wall thickening/edema. Please correlate clinically. 2. Mild anterior bladder wall thickening may represent under distension or cystitis. Please correlate clinically. 3. Small hiatal hernia. 4. Sigmoid colon diverticulosis without evidence for diverticulitis. 5.  Aortic Atherosclerosis (ICD10-I70.0).  Assessment and Plan: * Biliary colic Management per  general surgery. NPO IVF Repeat CMP in AM Call GI if LFTs elevating, but normal on presentation despite 4 days of symptoms. Cipro / flagyl (has PCN allergy) Regarding operative risk: Had uncomplicated spinal surgery in dec H/o CAD, non-occlusive on past LHC, has chronic exertional dyspnea so had stress test in May 2022, which was very much negative for any suggestion of ischemia. CXR today shows chronically increased lung volumes but no acute findings EKG today is essentially unchanged from priors. Overall pt likely acceptable risk for lap-chole if indicated. Gupta score 0.2% Will check UA as well, but again, HPI sounds much more suspicious for biliary colic.  Chronic back pain Continue norco, elavil, and lyrica.  Essential hypertension Hold HCTZ Cont ARB  Coronary artery disease involving native coronary artery without angina pectoris H/o CAD, non-occlusive on past LHC, has chronic exertional dyspnea so had stress test in May 2022, which was very much negative for any suggestion of ischemia. Hold ASA      Advance Care Planning:   Code Status: Full Code  Consults: General surgery  Family Communication: No family in room  Severity of Illness: The appropriate patient status for this patient is OBSERVATION. Observation status is judged to be reasonable and necessary in order to provide the required intensity of service to ensure the patient's safety. The patient's presenting symptoms, physical exam findings, and initial radiographic and laboratory data in the context of their medical condition is felt to place them at decreased risk for further clinical deterioration. Furthermore, it is anticipated that the patient will be medically stable for discharge from the hospital within 2 midnights of admission.   Author: Etta Quill., DO 07/13/2021 1:14 AM  For on call review [email or web address].

## 2021-07-13 NOTE — Op Note (Signed)
Laparoscopic Cholecystectomy Op Note  Preoperative diagnosis: cholelithiasis with acute cholecystitis  Postoperative diagnosis: same  Procedure: Laparoscopic cholecystectomy   Surgeon: Nathaneil Canary A. Ninfa Linden, M.D.  Resident: Linnell Fulling. Cathey Endow, M.D.  Anesthesia: Gen.  Indication:   Ms. Pollak is a 87 y.o. woman with acute cholecystitis, presenting for cholecystectomy.  Technique: The patient was brought to the operating room, placed supine on the operating table, and a general anesthetic was administered. The hair on the abdominal wall was clipped as was necessary. The abdominal wall was then sterilely prepped and draped. A time-out was performed.  A small subumbilical incision was made through the skin, subcutaneous tissue, fascia, and peritoneum entering the peritoneal cavity under direct vision. A pursestring suture of 0 Vicryl was placed around the edges of the fascia. A Hassan trocar was introduced into the peritoneal cavity and a pneumoperitoneum was created by insufflation of carbon dioxide gas. The laparoscope was introduced into the trocar and no underlying bleeding or organ injury was noted. The patient was then placed in the reverse Trendelenburg position with the right side tilted slightly up.  Three more trochars were then placed into the abdominal cavity under laparoscopic vision. One in the epigastric area, and 2 in the right upper quadrant area. The gallbladder was visualized and the fundus was grasped and retracted toward the right shoulder.  The infundibulum was mobilized with dissection close to the gallbladder and retracted laterally. The cystic duct was identified and a window was created around it. The cystic artery was also identified and a window was created around it. There was an additional 56m vessel in the cystic triangle, which was clipped proximally and divided.  The duct and artery were then clipped and divided. Following this the gallbladder was dissected free from  the liver using electrocautery. The gallbladder was then placed in a retrieval bag and removed from the abdominal cavity through the subumbilical incision.  The gallbladder fossa was inspected, irrigated, and bleeding was controlled with electrocautery. Inspection showed that hemostasis was adequate and there was no evidence of bile leak.  The irrigation fluid was evacuated as much as possible.  The subumbilical trocar was removed and the fascial defect was closed by tightening and tying down the pursestring suture under laparoscopic vision. The remaining trochars were removed and the pneumoperitoneum was released. The skin incisions were closed with 4-0 Monocryl subcuticular stitches and skin glue.  The procedure was well-tolerated without any apparent complications. The patient was taken to the recovery room in satisfactory condition.  EBL: 533m Complications: none  Disposition: PACU, hemodynamically stable

## 2021-07-13 NOTE — Assessment & Plan Note (Signed)
Hold HCTZ Cont ARB

## 2021-07-13 NOTE — Anesthesia Postprocedure Evaluation (Signed)
Anesthesia Post Note  Patient: Belinda Brown  Procedure(s) Performed: LAPAROSCOPIC CHOLECYSTECTOMY     Patient location during evaluation: PACU Anesthesia Type: General Level of consciousness: awake and alert Pain management: pain level controlled Vital Signs Assessment: post-procedure vital signs reviewed and stable Respiratory status: spontaneous breathing, nonlabored ventilation, respiratory function stable and patient connected to nasal cannula oxygen Cardiovascular status: blood pressure returned to baseline and stable Postop Assessment: no apparent nausea or vomiting Anesthetic complications: no   No notable events documented.  Last Vitals:  Vitals:   07/13/21 1215 07/13/21 1230  BP: 128/77 (!) 103/59  Pulse: 65 68  Resp: 15 15  Temp:    SpO2: 100% 94%    Last Pain:  Vitals:   07/13/21 1230  TempSrc:   PainSc: 3                  [name] S

## 2021-07-13 NOTE — Anesthesia Preprocedure Evaluation (Addendum)
Anesthesia Evaluation  Patient identified by MRN, date of birth, ID band Patient awake    Reviewed: Allergy & Precautions, NPO status , Patient's Chart, lab work & pertinent test results  History of Anesthesia Complications (+) DIFFICULT AIRWAY and history of anesthetic complications  Airway Mallampati: III  TM Distance: <3 FB Neck ROM: Full    Dental no notable dental hx.    Pulmonary neg pulmonary ROS,    Pulmonary exam normal breath sounds clear to auscultation       Cardiovascular hypertension, Pt. on medications +CHF  Normal cardiovascular exam Rhythm:Regular Rate:Normal  EF 35%   Neuro/Psych negative neurological ROS  negative psych ROS   GI/Hepatic negative GI ROS, Neg liver ROS,   Endo/Other  negative endocrine ROS  Renal/GU negative Renal ROS  negative genitourinary   Musculoskeletal negative musculoskeletal ROS (+)   Abdominal   Peds negative pediatric ROS (+)  Hematology negative hematology ROS (+)   Anesthesia Other Findings   Reproductive/Obstetrics negative OB ROS                             Anesthesia Physical Anesthesia Plan  ASA: 3  Anesthesia Plan: General   Post-op Pain Management: Minimal or no pain anticipated   Induction: Intravenous  PONV Risk Score and Plan: 3 and Ondansetron, Dexamethasone and Treatment may vary due to age or medical condition  Airway Management Planned: Oral ETT  Additional Equipment:   Intra-op Plan:   Post-operative Plan: Extubation in OR  Informed Consent: I have reviewed the patients History and Physical, chart, labs and discussed the procedure including the risks, benefits and alternatives for the proposed anesthesia with the patient or authorized representative who has indicated his/her understanding and acceptance.     Dental advisory given  Plan Discussed with: CRNA and Surgeon  Anesthesia Plan Comments:          Anesthesia Quick Evaluation

## 2021-07-13 NOTE — Assessment & Plan Note (Signed)
Continue norco, elavil, and lyrica.

## 2021-07-13 NOTE — Assessment & Plan Note (Signed)
H/o CAD, non-occlusive on past LHC, has chronic exertional dyspnea so had stress test in May 2022, which was very much negative for any suggestion of ischemia. 1. Hold ASA

## 2021-07-13 NOTE — Transfer of Care (Signed)
Immediate Anesthesia Transfer of Care Note  Patient: Belinda Brown  Procedure(s) Performed: LAPAROSCOPIC CHOLECYSTECTOMY  Patient Location: PACU  Anesthesia Type:General  Level of Consciousness: awake, alert  and oriented  Airway & Oxygen Therapy: Patient Spontanous Breathing and Patient connected to face mask  Post-op Assessment: Report given to RN and Post -op Vital signs reviewed and stable  Post vital signs: Reviewed and stable  Last Vitals:  Vitals Value Taken Time  BP    Temp    Pulse    Resp    SpO2      Last Pain:  Vitals:   07/13/21 0951  TempSrc:   PainSc: 0-No pain         Complications: No notable events documented.

## 2021-07-13 NOTE — Discharge Instructions (Signed)
[facility]  Please arrive at least 30 min before your appointment to complete your check in paperwork.  If you are unable to arrive 30 min prior to your appointment time we may have to cancel or reschedule you. LAPAROSCOPIC SURGERY: POST OP INSTRUCTIONS Always review your discharge instruction sheet given to you by the facility where your surgery was performed. IF YOU HAVE DISABILITY OR FAMILY LEAVE FORMS, YOU MUST BRING THEM TO THE OFFICE FOR PROCESSING.   DO NOT GIVE THEM TO YOUR DOCTOR.  PAIN CONTROL  First take acetaminophen (Tylenol) AND/or ibuprofen (Advil) to control your pain after surgery.  Follow directions on package.  Taking acetaminophen (Tylenol) and/or ibuprofen (Advil) regularly after surgery will help to control your pain and lower the amount of prescription pain medication you may need.  You should not take more than 4,000 mg (4 grams) of acetaminophen (Tylenol) in 24 hours.  You should not take ibuprofen (Advil), aleve, motrin, naprosyn or other NSAIDS if you have a history of stomach ulcers or chronic kidney disease.  A prescription for pain medication may be given to you upon discharge.  Take your pain medication as prescribed, if you still have uncontrolled pain after taking acetaminophen (Tylenol) or ibuprofen (Advil). Use ice packs to help control pain. If you need a refill on your pain medication, please contact your pharmacy.  They will contact our office to request authorization. Prescriptions will not be filled after 5pm or on week-ends.  HOME MEDICATIONS Take your usually prescribed medications unless otherwise directed.  DIET You should follow a light diet the first few days after arrival home.  Be sure to include lots of fluids daily. Avoid fatty, fried foods.   CONSTIPATION It is common to experience some constipation after surgery and if you are taking pain medication.  Increasing fluid intake and taking a stool softener (such as Colace)  will usually help or prevent this problem from occurring.  A mild laxative (Milk of Magnesia or Miralax) should be taken according to package instructions if there are no bowel movements after 48 hours.  WOUND/INCISION CARE Most patients will experience some swelling and bruising in the area of the incisions.  Ice packs will help.  Swelling and bruising can take several days to resolve.  Unless discharge instructions indicate otherwise, follow guidelines below  STERI-STRIPS - you may remove your outer bandages 48 hours after surgery, and you may shower at that time.  You have steri-strips (small skin tapes) in place directly over the incision.  These strips should be left on the skin for 7-10 days.   DERMABOND/SKIN GLUE - you may shower in 24 hours.  The glue will flake off over the next 2-3 weeks. Any sutures or staples will be removed at the office during your follow-up visit.  ACTIVITIES You may resume regular (light) daily activities beginning the next day--such as daily self-care, walking, climbing stairs--gradually increasing activities as tolerated.  You may have sexual intercourse when it is comfortable.  Refrain from any heavy lifting or straining until approved by your doctor. You may drive when you are no longer taking prescription pain medication, you can comfortably wear a seatbelt, and you can safely maneuver your car and apply brakes.  FOLLOW-UP You should see your doctor in the office for a follow-up appointment approximately 2-3 weeks after your surgery.  You should have been given your post-op/follow-up appointment when your surgery was scheduled.  If you did not receive a post-op/follow-up appointment, make sure   that you call for this appointment within a day or two after you arrive home to insure a convenient appointment time.   WHEN TO CALL YOUR DOCTOR: Fever over 101.0 Inability to urinate Continued bleeding from incision. Increased pain, redness, or drainage from the  incision. Increasing abdominal pain  The clinic staff is available to answer your questions during regular business hours.  Please don't hesitate to call and ask to speak to one of the nurses for clinical concerns.  If you have a medical emergency, go to the nearest emergency room or call 911.  A surgeon from [facility] is always on call at the hospital. [address], [address], [place], [place]  [address] ? [address] [address], [place], [place]   [address] [phone] ? [phone] ? FAX [phone]  

## 2021-07-14 ENCOUNTER — Encounter (HOSPITAL_COMMUNITY): Payer: Self-pay | Admitting: Surgery

## 2021-07-14 DIAGNOSIS — K8050 Calculus of bile duct without cholangitis or cholecystitis without obstruction: Secondary | ICD-10-CM | POA: Diagnosis not present

## 2021-07-14 DIAGNOSIS — R1011 Right upper quadrant pain: Secondary | ICD-10-CM | POA: Diagnosis not present

## 2021-07-14 DIAGNOSIS — I2510 Atherosclerotic heart disease of native coronary artery without angina pectoris: Secondary | ICD-10-CM | POA: Diagnosis not present

## 2021-07-14 DIAGNOSIS — I10 Essential (primary) hypertension: Secondary | ICD-10-CM | POA: Diagnosis not present

## 2021-07-14 LAB — CBC WITH DIFFERENTIAL/PLATELET
Abs Immature Granulocytes: 0.03 10*3/uL (ref 0.00–0.07)
Basophils Absolute: 0 10*3/uL (ref 0.0–0.1)
Basophils Relative: 0 %
Eosinophils Absolute: 0 10*3/uL (ref 0.0–0.5)
Eosinophils Relative: 0 %
HCT: 33.2 % — ABNORMAL LOW (ref 36.0–46.0)
Hemoglobin: 10.7 g/dL — ABNORMAL LOW (ref 12.0–15.0)
Immature Granulocytes: 0 %
Lymphocytes Relative: 10 %
Lymphs Abs: 0.8 10*3/uL (ref 0.7–4.0)
MCH: 30.7 pg (ref 26.0–34.0)
MCHC: 32.2 g/dL (ref 30.0–36.0)
MCV: 95.1 fL (ref 80.0–100.0)
Monocytes Absolute: 0.7 10*3/uL (ref 0.1–1.0)
Monocytes Relative: 9 %
Neutro Abs: 6.7 10*3/uL (ref 1.7–7.7)
Neutrophils Relative %: 81 %
Platelets: 159 10*3/uL (ref 150–400)
RBC: 3.49 MIL/uL — ABNORMAL LOW (ref 3.87–5.11)
RDW: 13.3 % (ref 11.5–15.5)
WBC: 8.3 10*3/uL (ref 4.0–10.5)
nRBC: 0 % (ref 0.0–0.2)

## 2021-07-14 LAB — COMPREHENSIVE METABOLIC PANEL
ALT: 25 U/L (ref 0–44)
AST: 33 U/L (ref 15–41)
Albumin: 3.1 g/dL — ABNORMAL LOW (ref 3.5–5.0)
Alkaline Phosphatase: 42 U/L (ref 38–126)
Anion gap: 6 (ref 5–15)
BUN: 16 mg/dL (ref 8–23)
CO2: 24 mmol/L (ref 22–32)
Calcium: 8.3 mg/dL — ABNORMAL LOW (ref 8.9–10.3)
Chloride: 104 mmol/L (ref 98–111)
Creatinine, Ser: 0.77 mg/dL (ref 0.44–1.00)
GFR, Estimated: >60 mL/min (ref >60)
Glucose, Bld: 143 mg/dL — ABNORMAL HIGH (ref 70–99)
Potassium: 4.3 mmol/L (ref 3.5–5.1)
Sodium: 134 mmol/L — ABNORMAL LOW (ref 135–145)
Total Bilirubin: 1 mg/dL (ref 0.3–1.2)
Total Protein: 5.1 g/dL — ABNORMAL LOW (ref 6.5–8.1)

## 2021-07-14 LAB — SURGICAL PATHOLOGY

## 2021-07-14 LAB — PHOSPHORUS: Phosphorus: 3 mg/dL (ref 2.5–4.6)

## 2021-07-14 LAB — MAGNESIUM: Magnesium: 1.9 mg/dL (ref 1.7–2.4)

## 2021-07-14 MED ORDER — SODIUM CHLORIDE 0.9 % IV BOLUS
500.0000 mL | Freq: Once | INTRAVENOUS | Status: AC
  Administered 2021-07-14: 500 mL via INTRAVENOUS

## 2021-07-14 MED ORDER — ONDANSETRON HCL 4 MG PO TABS: 4.0000 mg | ORAL_TABLET | Freq: Four times a day (QID) | ORAL | 0 refills | Status: DC | PRN

## 2021-07-14 MED ORDER — TRAMADOL HCL 50 MG PO TABS: 50.0000 mg | ORAL_TABLET | Freq: Four times a day (QID) | ORAL | 0 refills | Status: DC | PRN

## 2021-07-14 MED ORDER — HYDROCHLOROTHIAZIDE 25 MG PO TABS: 25.0000 mg | ORAL_TABLET | Freq: Every day | ORAL | 0 refills | Status: AC

## 2021-07-14 NOTE — TOC Transition Note (Signed)
Transition of Care [facility]) - CM/SW Discharge Note   Patient Details  Name: Belinda Brown MRN: [id number] Date of Birth: 20-Nov-1934  Transition of Care [facility]) CM/SW Contact:  Leeroy Cha, RN Phone Number: 07/14/2021, 11:04 AM   Clinical Narrative:    Patient dcd to home with no toc needs   Final next level of care: Home/Self Care Barriers to Discharge: No Barriers Identified   Patient Goals and CMS Choice Patient states their goals for this hospitalization and ongoing recovery are:: to go home CMS Medicare.gov Compare Post Acute Care list provided to:: Patient Choice offered to / list presented to : Patient  Discharge Placement                       Discharge Plan and Services   Discharge Planning Services: CM Consult                                 Social Determinants of Health (SDOH) Interventions     Readmission Risk Interventions     View : No data to display.

## 2021-07-14 NOTE — Evaluation (Signed)
Physical Therapy Evaluation Patient Details Name: Belinda Brown MRN: [id number] DOB: 1934-10-29 Today's Date: 07/14/2021  History of Present Illness  Pt is an 87yo female presenting to [facility] ED on 07/12/21 with chest & RUQ pain associated with eating, N&V, s/p laparoscopic cholecystectomy on 5/31.   PMH: OA, HTN, OSA, cervical laminectomy, CAD s/p CATH, lumbar laminectomy.  Clinical Impression  Belinda Brown is a 87 y.o. female POD1 s/p lap chole. Patient reports modified independence with mobility at baseline. Patient was able to ambulate 120 feet with IV pole and min guard assist. Pt reporting and confirmed by PT some existing LLE weakness, reduced range of motion, and reduced sensation that was present PTA. Patient evaluated by Physical Therapy with no further acute PT needs identified. All education has been completed and the patient has no further questions. Pt feels she is at baseline level of functioning. See below for any follow-up Physical Therapy or equipment needs. PT is signing off. Thank you for this referral.      Recommendations for follow up therapy are one component of a multi-disciplinary discharge planning process, led by the attending physician.  Recommendations may be updated based on patient status, additional functional criteria and insurance authorization.  Follow Up Recommendations No PT follow up    Assistance Recommended at Discharge PRN  Patient can return home with the following  A little help with walking and/or transfers;A little help with bathing/dressing/bathroom;Assistance with cooking/housework;Help with stairs or ramp for entrance;Assist for transportation    Equipment Recommendations None recommended by PT  Recommendations for Other Services       Functional Status Assessment Patient has had a recent decline in their functional status and demonstrates the ability to make significant improvements in function in a reasonable and predictable amount of time.      Precautions / Restrictions Precautions Precautions: Fall Restrictions Weight Bearing Restrictions: No      Mobility  Bed Mobility               General bed mobility comments: Pt sitting in recliner upon entry, educated about logroll    Transfers Overall transfer level: Modified independent Equipment used: None               General transfer comment: increased time    Ambulation/Gait Ambulation/Gait assistance: Min guard Gait Distance (Feet): 120 Feet Assistive device: IV Pole Gait Pattern/deviations: Step-through pattern, Decreased dorsiflexion - left Gait velocity: decreased     General Gait Details: Pt ambulated with IV pole and min guard assist, no physical assist required or overt LOB noted. Decreased dorsiflexion on L, some reaching for handrails during ambulation task. Reinforced use of RW post-discharge for increased safety for period of two weeks.  Stairs            Wheelchair Mobility    Modified Rankin (Stroke Patients Only)       Balance Overall balance assessment: Mild deficits observed, not formally tested                                           Pertinent Vitals/Pain Pain Assessment Pain Assessment: 0-10 Pain Score: 5  Pain Location: abdomen Pain Descriptors / Indicators: Operative site guarding Pain Intervention(s): Limited activity within patient's tolerance, Monitored during session, Repositioned    Home Living Family/patient expects to be discharged to:: Private residence Living Arrangements: Alone Available Help at Discharge: Neighbor;Family;Available  24 hours/day (Neighbor [employer], Son Cindee Salt and [place]) Type of Home: House Home Access: Stairs to enter Entrance Stairs-Rails: None (Can use doorframe) Entrance Stairs-Number of Steps: 3   Home Layout: One level Home Equipment: [place] Walker (2 wheels);Shower seat;Hand held shower head;Cane - single point      Prior Function Prior Level of  Function : Independent/Modified Independent;Driving             Mobility Comments: Uses cane to walk to mailbox or for longer distances ADLs Comments: Mod I with ADLs using AD/DME;     Hand Dominance   Dominant Hand: Right    Extremity/Trunk Assessment   Upper Extremity Assessment Upper Extremity Assessment: Overall WFL for tasks assessed    Lower Extremity Assessment Lower Extremity Assessment: LLE deficits/detail;Overall WFL for tasks assessed LLE Deficits / Details: MMT grossly 4+/5 excepting ankle DF/PF 3/5 with reduced DF ROM to less than neutral, pt reports secondary to shingles prior to admission. LLE Sensation: decreased light touch (Reports pins and needles in calf, foot WNL)    Cervical / Trunk Assessment Cervical / Trunk Assessment: Back Surgery  Communication   Communication: No difficulties  Cognition Arousal/Alertness: Awake/alert Behavior During Therapy: WFL for tasks assessed/performed Overall Cognitive Status: Within Functional Limits for tasks assessed                                          General Comments General comments (skin integrity, edema, etc.): Son Randall Hiss present for session    Exercises     Assessment/Plan    PT Assessment Patient needs continued PT services  PT Problem List Decreased strength;Decreased range of motion;Decreased activity tolerance;Decreased balance;Decreased mobility;Decreased coordination;Pain       PT Treatment Interventions DME instruction;Gait training;Stair training;Functional mobility training;Therapeutic activities;Therapeutic exercise;Balance training;Neuromuscular re-education;Patient/family education    PT Goals (Current goals can be found in the Care Plan section)  Acute Rehab PT Goals Patient Stated Goal: To get back to her garden PT Goal Formulation: With patient Time For Goal Achievement: 07/21/21 Potential to Achieve Goals: Good    Frequency Min 3X/week     Co-evaluation                AM-PAC PT "6 Clicks" Mobility  Outcome Measure Help needed turning from your back to your side while in a flat bed without using bedrails?: None Help needed moving from lying on your back to sitting on the side of a flat bed without using bedrails?: None Help needed moving to and from a bed to a chair (including a wheelchair)?: A Little Help needed standing up from a chair using your arms (e.g., wheelchair or bedside chair)?: A Little Help needed to walk in hospital room?: A Little Help needed climbing 3-5 steps with a railing? : A Little 6 Click Score: 20    End of Session Equipment Utilized During Treatment: Gait belt Activity Tolerance: Patient tolerated treatment well;No increased pain Patient left: in chair;with call bell/phone within reach;with chair alarm set;with family/visitor present Nurse Communication: Mobility status PT Visit Diagnosis: Unsteadiness on feet (R26.81)    Time: 1884-1660 PT Time Calculation (min) (ACUTE ONLY): 22 min   Charges:   PT Evaluation $PT Eval Low Complexity: 1 Low          Coolidge Breeze, PT, DPT [facility] Office: [phone] Pager: [phone]  Coolidge Breeze 07/14/2021, 11:02 AM

## 2021-07-14 NOTE — Progress Notes (Signed)
1 Day Post-Op   Subjective/Chief Complaint: Doing well No complaints Tolerating po   Objective: Vital signs in last 24 hours: Temp:  [97.4 F (36.3 C)-98.2 F (36.8 C)] 98.2 F (36.8 C) (06/01 0602) Pulse Rate:  [56-84] 76 (06/01 0602) Resp:  [14-20] 20 (06/01 0602) BP: (92-144)/(53-80) 92/55 (06/01 0602) SpO2:  [94 %-100 %] 95 % (06/01 0602) Last BM Date : 07/12/21  Intake/Output from previous day: 05/31 0701 - 06/01 0700 In: 2675 [I.V.:2675] Out: 815 [Urine:800; Blood:15] Intake/Output this shift: No intake/output data recorded.  Exam: Awake and alert Comfortable Abdomen soft, incisions clean  Lab Results:  Recent Labs    07/13/21 0418 07/14/21 0412  WBC 4.6 8.3  HGB 12.9 10.7*  HCT 39.0 33.2*  PLT 175 159   BMET Recent Labs    07/13/21 0418 07/14/21 0412  NA 139 134*  K 3.4* 4.3  CL 103 104  CO2 27 24  GLUCOSE 110* 143*  BUN 13 16  CREATININE 0.45 0.77  CALCIUM 9.0 8.3*   PT/INR No results for input(s): LABPROT, INR in the last 72 hours. ABG No results for input(s): PHART, HCO3 in the last 72 hours.  Invalid input(s): PCO2, PO2  Studies/Results: DG Chest 2 View  Result Date: 07/12/2021 CLINICAL DATA:  Chest and epigastric pain EXAM: CHEST - 2 VIEW COMPARISON:  08/12/2018 FINDINGS: Normal heart size, mediastinal contours, and pulmonary vascularity. Prior median sternotomy. Lungs hyperinflated but clear. No pulmonary infiltrate, pleural effusion, or pneumothorax. Bones demineralized. IMPRESSION: Hyperinflated lungs without acute infiltrate. Electronically Signed   By: Lavonia Dana M.D.   On: 07/12/2021 17:05   CT ABDOMEN PELVIS W CONTRAST  Result Date: 07/12/2021 CLINICAL DATA:  Upper abdominal pain. EXAM: CT ABDOMEN AND PELVIS WITH CONTRAST TECHNIQUE: Multidetector CT imaging of the abdomen and pelvis was performed using the standard protocol following bolus administration of intravenous contrast. RADIATION DOSE REDUCTION: This exam was performed  according to the departmental dose-optimization program which includes automated exposure control, adjustment of the mA and/or kV according to patient size and/or use of iterative reconstruction technique. CONTRAST:  168m OMNIPAQUE IOHEXOL 300 MG/ML  SOLN COMPARISON:  Abdominal ultrasound 07/12/2021. CT abdomen and pelvis 10/31/2016. FINDINGS: Lower chest: No acute abnormality. Hepatobiliary: Mild diffuse gallbladder wall thickening/edema. This was not well appreciated on ultrasound. No surrounding inflammation or biliary ductal dilatation. Liver is within normal limits. Pancreas: Unremarkable. No pancreatic ductal dilatation or surrounding inflammatory changes. Spleen: Normal in size without focal abnormality. Adrenals/Urinary Tract: There is a rounded hypodensity in the left kidney which is too small to characterize and unchanged, favored as a small cyst. Otherwise, the adrenal glands and kidneys are within normal limits. There is mild diffuse bladder wall thickening anteriorly. No bladder calculi are seen. Stomach/Bowel: Small hiatal hernia is present. Stomach is otherwise within normal limits. Appendix appears normal. No evidence of bowel wall thickening, distention, or inflammatory changes. There is sigmoid colon diverticulosis without evidence for acute diverticulitis. Vascular/Lymphatic: Aortic atherosclerosis. No enlarged abdominal or pelvic lymph nodes. Reproductive: Uterus and bilateral adnexa are unremarkable. Other: No abdominal wall hernia or abnormality. No abdominopelvic ascites. Musculoskeletal: L3-L5 posterior fusion hardware is present. Multilevel degenerative changes affect the spine. IMPRESSION: 1. Mild gallbladder wall thickening/edema. Please correlate clinically. 2. Mild anterior bladder wall thickening may represent under distension or cystitis. Please correlate clinically. 3. Small hiatal hernia. 4. Sigmoid colon diverticulosis without evidence for diverticulitis. 5.  Aortic Atherosclerosis  (ICD10-I70.0). Electronically Signed   By: ARonney AstersM.D.   On:  07/12/2021 22:38   US Abdomen Limited RUQ (LIVER/GB)  Result Date: 07/12/2021 CLINICAL DATA:  Epigastric pain. EXAM: ULTRASOUND ABDOMEN LIMITED RIGHT UPPER QUADRANT COMPARISON:  CT abdomen pelvis dated October 31, 2016. FINDINGS: Gallbladder: Multiple tiny gallstones. No wall thickening visualized. No sonographic Murphy sign noted by sonographer. Common bile duct: Diameter: 4 mm, normal. Liver: No focal lesion identified. Mildly increased in parenchymal echogenicity. Portal vein is patent on color Doppler imaging with normal direction of blood flow towards the liver. Other: None. IMPRESSION: 1. No acute abnormality. 2. Cholelithiasis. Electronically Signed   By: Titus Dubin M.D.   On: 07/12/2021 17:28    Anti-infectives: Anti-infectives (From admission, onward)    Start     Dose/Rate Route Frequency Ordered Stop   07/13/21 1100  ciprofloxacin (CIPRO) IVPB 400 mg  Status:  Discontinued        400 mg 200 mL/hr over 60 Minutes Intravenous Every 12 hours 07/13/21 0109 07/13/21 1347   07/13/21 0200  metroNIDAZOLE (FLAGYL) IVPB 500 mg  Status:  Discontinued        500 mg 100 mL/hr over 60 Minutes Intravenous Every 12 hours 07/13/21 0109 07/13/21 1347   07/12/21 2300  ciprofloxacin (CIPRO) IVPB 400 mg        400 mg 200 mL/hr over 60 Minutes Intravenous  Once 07/12/21 2257 07/13/21 0009       Assessment/Plan: s/p Procedure(s): LAPAROSCOPIC CHOLECYSTECTOMY (N/A)  POD#1 s/p lap chole  Ok for discharge home from surgical standpoint Will see in the office post op  LOS: 0 days    Coralie Keens 07/14/2021

## 2021-07-14 NOTE — TOC Progression Note (Signed)
Transition of Care [facility]) - Progression Note    Patient Details  Name: Belinda Brown MRN: [id number] Date of Birth: 23-Sep-1934  [facility] CM/SW Contact  Leeroy Cha, RN Phone Number: 07/14/2021, 7:58 AM  Clinical Narrative:    Patient had lap choile on [phone] will follow for dc needs.   Expected Discharge Plan: Home/Self Care Barriers to Discharge: No Barriers Identified  Expected Discharge Plan and Services Expected Discharge Plan: Home/Self Care   Discharge Planning Services: CM Consult   Living arrangements for the past 2 months: Single Family Home                                       Social Determinants of Health (SDOH) Interventions    Readmission Risk Interventions     View : No data to display.

## 2021-07-14 NOTE — Plan of Care (Signed)
Discharge instructions reviewed with patient and son, questions answered, verbalized understanding.  Patient transported via wheelchair to main entrance to be taken home by son.

## 2021-07-14 NOTE — Discharge Summary (Signed)
Physician Discharge Summary   Patient: Belinda Brown MRN: [id number] DOB: 28-Sep-1934  Admit date:     07/12/2021  Discharge date: 07/14/21  Discharge Physician: Raiford Noble, DO   PCP: Cyndi Bender, PA-C   Recommendations at discharge:   Follow up with PCP within 1-2 weeks and repeat CBC, CMP, Mag, Phos within 1 week Follow up with General Surgery within 1-2 weeks   Discharge Diagnoses: Principal Problem:   Biliary colic Active Problems:   Coronary artery disease involving native coronary artery without angina pectoris   Essential hypertension   Chronic back pain  Resolved Problems:   Abdominal pain  Hospital Course: The patient is an 87 year old Caucasian female with a past medical history significant for but limited to hypertension, hyperlipidemia, nonobstructive CAD, lumbar radiculopathy status post decompression and fusion the back in 2022 who presented with epigastric and right upper quadrant abdominal pain.  Her symptoms started about 4 days ago and have been intermittent.  They occur after eating and abdominal discomfort and pain is better when she is not eating.  She has had associated nausea and vomiting.  She had not right upper quadrant ultrasound that showed gallstones but no features of cholecystitis but she had a CT of the abdomen pelvis which showed mild gallbladder wall thickening and edema and mild anterior bladder wall thickening that represents underdistention or cystitis.  Is noted that she also had a sigmoid colon diverticulosis without evidence of diverticulitis and aortic atherosclerosis.  She is admitted for biliary colic and general surgery has been consulted and awaiting further evaluation recommendations.  She has been initiated on IV antibiotics with Cipro/Flagyl and has been made NPO.  Her bilirubin is still slightly elevated at 1.4 and potassium is now low at 3.4. Currently she is being admitted and treated for biliary colic in setting of acute cholecystitis  associated with cholelithiasis.  She is status postcholecystectomy and improved.  Her bilirubin is trending down and she is stable for discharge at this time  Assessment and Plan: Biliary colic in the setting of Cholelithiasis with Acute Cholecystitis s/p Cholecystectomy  Hyperbilirubinemia, improved -Management per general surgery. -NPO for Surgery and now on a diet -IVF discontinued with LR at 75 mL/hr but she received a 500 mill bolus given her hypotension -Call GI if LFTs elevating, but normal on presentation despite 4 days of symptoms and T Bili is now stopped. -Cipro / flagyl (has PCN allergy) but now Surgery feels she doesn't need any more Abx Regarding operative risk: Had uncomplicated spinal surgery in dec H/o CAD, non-occlusive on past LHC, has chronic exertional dyspnea so had stress test in May 2022, which was very much negative for any suggestion of ischemia. CXR today shows chronically increased lung volumes but no acute findings EKG today is essentially unchanged from priors. Overall pt likely acceptable risk for lap-chole if indicated. Lyndel Safe score 0.2% -Stable from a Surgical Standpoint for D/C  -Will check PT/OT to Evaluate    Chronic back pain -Continue norco, elavil, and lyrica. -Follow-up with PCP   Essential hypertension -Hold HCTZ and resume at D/C in a few days and she was told to resume on Monday -Cont ARB but held today given her hypotension and she is told to resume in a few days -Continue to Monitor BP per Protocol  -Last BP was on the softer side and is now 100/61   Coronary artery disease involving native coronary artery without angina pectoris H/o CAD, non-occlusive on past LHC, has chronic exertional dyspnea so  had stress test in May 2022, which was very much negative for any suggestion of ischemia. -Held ASA and resume when ok with Surgery    Hypokalemia -Mild. K+ is now 4.3 -Continue to Monitor and Replete as Necessary -Repeat CMP in the AM    Hypoalbuminemia -Patient's Albumin Level went from 4.3 -> 3.7 -> 3.1 -Continue to Monitor and Trend in the outpatient setting  Normocytic Anemia with ABLA as a Postoperative Cause with Dilutional Component -Patient's Hgb/Hct went from 14.7/42.8 -> 12.9/39.0 -> 10.7/33.2 -Ok to Resume ASA per Surgery -Check Anemia Panel in the outpatient setting -Continue to Monitor for S/Sx of Bleeding; No overt bleeding noted -Repeat CBC in the outpatient Setting within 1 week   Hyponatremia  -Mild. Na+ went from 136 -> 139 -> 134 -Continue to Monitor and Trend and repeat CMP within 1 week   Consultants: General Surgery  Procedures performed: Laparoscopic Cholecystectomy   Disposition: Home  Diet recommendation:  Dysphagia type 3 Thin Liquid  DISCHARGE MEDICATION: Allergies as of 07/14/2021       Reactions   Ampicillin Hives   Has patient had a PCN reaction causing immediate rash, facial/tongue/throat swelling, SOB or lightheadedness with hypotension: Yes Has patient had a PCN reaction causing severe rash involving mucus membranes or skin necrosis: No Has patient had a PCN reaction that required hospitalization: No Has patient had a PCN reaction occurring within the last 10 years: No If all of the above answers are "NO", then may proceed with Cephalosporin use.   Codeine Other (See Comments)   hallucinations Other reaction(s): hives/confusion        Medication List     STOP taking these medications    amitriptyline 10 MG tablet Commonly known as: ELAVIL   HYDROcodone-acetaminophen 5-325 MG tablet Commonly known as: NORCO/VICODIN       TAKE these medications    acetaminophen 500 MG tablet Commonly known as: TYLENOL Take 1,000 mg by mouth every 6 (six) hours as needed for mild pain.   aspirin EC 81 MG tablet Take 81 mg by mouth daily.   cholecalciferol 25 MCG (1000 UNIT) tablet Commonly known as: VITAMIN D Take 1,000 Units by mouth daily. Notes to patient: 07/15/2021     hydrochlorothiazide 25 MG tablet Commonly known as: HYDRODIURIL Take 1 tablet (25 mg total) by mouth daily. Start taking on: July 18, 2021 What changed: These instructions start on July 18, 2021. If you are unsure what to do until then, ask your doctor or other care provider. Notes to patient: 07/18/2021    loratadine 10 MG tablet Commonly known as: CLARITIN Take 10 mg by mouth daily as needed for allergies.   losartan 50 MG tablet Commonly known as: COZAAR Take 50 mg by mouth daily. Notes to patient: 07/15/2021    ondansetron 4 MG tablet Commonly known as: ZOFRAN Take 1 tablet (4 mg total) by mouth every 6 (six) hours as needed for nausea.   pregabalin 75 MG capsule Commonly known as: Lyrica Take 1 capsule (75 mg total) by mouth 2 (two) times daily. Notes to patient: 07/14/2021 evening dose    traMADol 50 MG tablet Commonly known as: Ultram Take 1 tablet (50 mg total) by mouth every 6 (six) hours as needed.   Turmeric 500 MG Tabs Take 500 mg by mouth daily. Notes to patient: 07/15/2021    vitamin B-12 500 MCG tablet Commonly known as: CYANOCOBALAMIN Take 500 mcg by mouth daily. Notes to patient: 07/15/2021    vitamin C 500  MG tablet Commonly known as: ASCORBIC ACID Take 500 mg by mouth daily. Notes to patient: 07/15/2021         Follow-up Information     [facility], [facility] [place] Follow up on 08/04/2021.   Specialty: General Surgery Why: 6/22 at 2:45. Please bring a copy of your photo ID and insurance card. Please arrive 30 minutes prior to your appointment for paperwork. Contact information: [address] [place] [address] [phone]                Discharge Exam: Danley Danker Weights   07/12/21 1623  Weight: 61.2 kg   Vitals:   07/14/21 0932 07/14/21 1025  BP: (!) 100/44 100/61  Pulse:  61  Resp:    Temp:    SpO2: 95%    Examination: Physical Exam:  Constitutional: Thin elderly Caucasian female in NAD Respiratory: Diminished to  auscultation bilaterally, no wheezing, rales, rhonchi or crackles. Normal respiratory effort and patient is not tachypenic. No accessory muscle use. Unlabored breathing  Cardiovascular: RRR, no murmurs / rubs / gallops. S1 and S2 auscultated. No extremity edema.  Abdomen: Soft, mildly-tender, non-distended. Bowel sounds positive.  GU: Deferred. Musculoskeletal: No clubbing / cyanosis of digits/nails. No joint deformity upper and lower extremities.  Neurologic: CN 2-12 grossly intact with no focal deficits. Romberg sign and cerebellar reflexes not assessed.  Psychiatric: Normal judgment and insight. Alert and oriented x 3. Normal mood and appropriate affect.   Condition at discharge: stable  The results of significant diagnostics from this hospitalization (including imaging, microbiology, ancillary and laboratory) are listed below for reference.   Imaging Studies: DG Chest 2 View  Result Date: 07/12/2021 CLINICAL DATA:  Chest and epigastric pain EXAM: CHEST - 2 VIEW COMPARISON:  08/12/2018 FINDINGS: Normal heart size, mediastinal contours, and pulmonary vascularity. Prior median sternotomy. Lungs hyperinflated but clear. No pulmonary infiltrate, pleural effusion, or pneumothorax. Bones demineralized. IMPRESSION: Hyperinflated lungs without acute infiltrate. Electronically Signed   By: Lavonia Dana M.D.   On: 07/12/2021 17:05   CT ABDOMEN PELVIS W CONTRAST  Result Date: 07/12/2021 CLINICAL DATA:  Upper abdominal pain. EXAM: CT ABDOMEN AND PELVIS WITH CONTRAST TECHNIQUE: Multidetector CT imaging of the abdomen and pelvis was performed using the standard protocol following bolus administration of intravenous contrast. RADIATION DOSE REDUCTION: This exam was performed according to the departmental dose-optimization program which includes automated exposure control, adjustment of the mA and/or kV according to patient size and/or use of iterative reconstruction technique. CONTRAST:  180m OMNIPAQUE  IOHEXOL 300 MG/ML  SOLN COMPARISON:  Abdominal ultrasound 07/12/2021. CT abdomen and pelvis 10/31/2016. FINDINGS: Lower chest: No acute abnormality. Hepatobiliary: Mild diffuse gallbladder wall thickening/edema. This was not well appreciated on ultrasound. No surrounding inflammation or biliary ductal dilatation. Liver is within normal limits. Pancreas: Unremarkable. No pancreatic ductal dilatation or surrounding inflammatory changes. Spleen: Normal in size without focal abnormality. Adrenals/Urinary Tract: There is a rounded hypodensity in the left kidney which is too small to characterize and unchanged, favored as a small cyst. Otherwise, the adrenal glands and kidneys are within normal limits. There is mild diffuse bladder wall thickening anteriorly. No bladder calculi are seen. Stomach/Bowel: Small hiatal hernia is present. Stomach is otherwise within normal limits. Appendix appears normal. No evidence of bowel wall thickening, distention, or inflammatory changes. There is sigmoid colon diverticulosis without evidence for acute diverticulitis. Vascular/Lymphatic: Aortic atherosclerosis. No enlarged abdominal or pelvic lymph nodes. Reproductive: Uterus and bilateral adnexa are unremarkable. Other: No abdominal wall hernia or  abnormality. No abdominopelvic ascites. Musculoskeletal: L3-L5 posterior fusion hardware is present. Multilevel degenerative changes affect the spine. IMPRESSION: 1. Mild gallbladder wall thickening/edema. Please correlate clinically. 2. Mild anterior bladder wall thickening may represent under distension or cystitis. Please correlate clinically. 3. Small hiatal hernia. 4. Sigmoid colon diverticulosis without evidence for diverticulitis. 5.  Aortic Atherosclerosis (ICD10-I70.0). Electronically Signed   By: Ronney Asters M.D.   On: 07/12/2021 22:38   US Abdomen Limited RUQ (LIVER/GB)  Result Date: 07/12/2021 CLINICAL DATA:  Epigastric pain. EXAM: ULTRASOUND ABDOMEN LIMITED RIGHT UPPER  QUADRANT COMPARISON:  CT abdomen pelvis dated October 31, 2016. FINDINGS: Gallbladder: Multiple tiny gallstones. No wall thickening visualized. No sonographic Murphy sign noted by sonographer. Common bile duct: Diameter: 4 mm, normal. Liver: No focal lesion identified. Mildly increased in parenchymal echogenicity. Portal vein is patent on color Doppler imaging with normal direction of blood flow towards the liver. Other: None. IMPRESSION: 1. No acute abnormality. 2. Cholelithiasis. Electronically Signed   By: Titus Dubin M.D.   On: 07/12/2021 17:28    Microbiology: Results for orders placed or performed during the hospital encounter of 01/11/21  SARS CORONAVIRUS 2 (TAT 6-24 HRS) Nasopharyngeal Nasopharyngeal Swab     Status: None   Collection Time: 01/11/21 11:22 AM   Specimen: Nasopharyngeal Swab  Result Value Ref Range Status   SARS Coronavirus 2 NEGATIVE NEGATIVE Final    Comment: (NOTE) SARS-CoV-2 target nucleic acids are NOT DETECTED.  The SARS-CoV-2 RNA is generally detectable in upper and lower respiratory specimens during the acute phase of infection. Negative results do not preclude SARS-CoV-2 infection, do not rule out co-infections with other pathogens, and should not be used as the sole basis for treatment or other patient management decisions. Negative results must be combined with clinical observations, patient history, and epidemiological information. The expected result is Negative.  Fact Sheet for Patients: SugarRoll.be  Fact Sheet for Healthcare Providers: https://www.woods-mathews.com/  This test is not yet approved or cleared by the [place] FDA and  has been authorized for detection and/or diagnosis of SARS-CoV-2 by FDA under an Emergency Use Authorization (EUA). This EUA will remain  in effect (meaning this test can be used) for the duration of the COVID-19 declaration under Se ction 564(b)(1) of the Act, 21  U.S.C. section 360bbb-3(b)(1), unless the authorization is terminated or revoked sooner.  Performed at [facility], [place] [address] [place], [place] [address]   Surgical pcr screen     Status: None   Collection Time: 01/11/21 11:23 AM   Specimen: Nasal Mucosa; Nasal Swab  Result Value Ref Range Status   MRSA, PCR NEGATIVE NEGATIVE Final   Staphylococcus aureus NEGATIVE NEGATIVE Final    Comment: (NOTE) The Xpert SA Assay (FDA approved for NASAL specimens in patients 67 years of age and older), is one component of a comprehensive surveillance program. It is not intended to diagnose infection nor to guide or monitor treatment. Performed at [facility], [place] [address] [place], [place] [address]    Labs: CBC: Recent Labs  Lab 07/12/21 1633 07/13/21 0418 07/14/21 0412  WBC 7.9 4.6 8.3  NEUTROABS 6.0  --  6.7  HGB 14.7 12.9 10.7*  HCT 42.8 39.0 33.2*  MCV 91.8 92.4 95.1  PLT 198 175 628   Basic Metabolic Panel: Recent Labs  Lab 07/12/21 1633 07/13/21 0418 07/14/21 0412  NA 136 139 134*  K 3.5 3.4* 4.3  CL 98 103 104  CO2 29 27 24  GLUCOSE 126* 110* 143*  BUN '16 13 16  '$ CREATININE 0.68 0.45 0.77  CALCIUM 9.7 9.0 8.3*  MG  --   --  1.9  PHOS  --   --  3.0   Liver Function Tests: Recent Labs  Lab 07/12/21 1633 07/13/21 0418 07/14/21 0412  AST 18 15 33  ALT '14 11 25  '$ ALKPHOS 68 56 42  BILITOT 1.4* 1.4* 1.0  PROT 7.1 6.1* 5.1*  ALBUMIN 4.3 3.7 3.1*   CBG: No results for input(s): GLUCAP in the last 168 hours.  Discharge time spent: greater than 30 minutes.  Signed: Raiford Noble, DO [facility] 07/14/2021

## 2021-07-21 DIAGNOSIS — M48062 Spinal stenosis, lumbar region with neurogenic claudication: Secondary | ICD-10-CM | POA: Diagnosis not present

## 2021-10-06 DIAGNOSIS — M5416 Radiculopathy, lumbar region: Secondary | ICD-10-CM | POA: Diagnosis not present

## 2021-10-06 DIAGNOSIS — K529 Noninfective gastroenteritis and colitis, unspecified: Secondary | ICD-10-CM | POA: Diagnosis not present

## 2021-10-06 DIAGNOSIS — R5383 Other fatigue: Secondary | ICD-10-CM | POA: Diagnosis not present

## 2021-10-06 DIAGNOSIS — M545 Low back pain, unspecified: Secondary | ICD-10-CM | POA: Diagnosis not present

## 2021-10-06 DIAGNOSIS — E559 Vitamin D deficiency, unspecified: Secondary | ICD-10-CM | POA: Diagnosis not present

## 2021-10-06 DIAGNOSIS — I10 Essential (primary) hypertension: Secondary | ICD-10-CM | POA: Diagnosis not present

## 2021-10-06 DIAGNOSIS — R350 Frequency of micturition: Secondary | ICD-10-CM | POA: Diagnosis not present

## 2021-10-06 DIAGNOSIS — E785 Hyperlipidemia, unspecified: Secondary | ICD-10-CM | POA: Diagnosis not present

## 2021-10-06 DIAGNOSIS — B0229 Other postherpetic nervous system involvement: Secondary | ICD-10-CM | POA: Diagnosis not present

## 2021-10-06 DIAGNOSIS — E538 Deficiency of other specified B group vitamins: Secondary | ICD-10-CM | POA: Diagnosis not present

## 2021-10-06 DIAGNOSIS — R7303 Prediabetes: Secondary | ICD-10-CM | POA: Diagnosis not present

## 2021-10-24 DIAGNOSIS — L814 Other melanin hyperpigmentation: Secondary | ICD-10-CM | POA: Diagnosis not present

## 2021-10-24 DIAGNOSIS — L821 Other seborrheic keratosis: Secondary | ICD-10-CM | POA: Diagnosis not present

## 2021-10-24 DIAGNOSIS — D2239 Melanocytic nevi of other parts of face: Secondary | ICD-10-CM | POA: Diagnosis not present

## 2021-10-24 DIAGNOSIS — D485 Neoplasm of uncertain behavior of skin: Secondary | ICD-10-CM | POA: Diagnosis not present

## 2021-10-24 DIAGNOSIS — D225 Melanocytic nevi of trunk: Secondary | ICD-10-CM | POA: Diagnosis not present

## 2021-10-26 DIAGNOSIS — M5416 Radiculopathy, lumbar region: Secondary | ICD-10-CM | POA: Diagnosis not present

## 2021-11-01 DIAGNOSIS — M5416 Radiculopathy, lumbar region: Secondary | ICD-10-CM | POA: Diagnosis not present

## 2021-11-21 ENCOUNTER — Ambulatory Visit: Payer: Medicare HMO | Attending: Cardiology | Admitting: Cardiology

## 2021-11-21 ENCOUNTER — Encounter: Payer: Self-pay | Admitting: Cardiology

## 2021-11-21 VITALS — BP 120/88 | HR 73 | Ht 65.0 in | Wt 134.8 lb

## 2021-11-21 DIAGNOSIS — I2510 Atherosclerotic heart disease of native coronary artery without angina pectoris: Secondary | ICD-10-CM | POA: Diagnosis not present

## 2021-11-21 DIAGNOSIS — E7800 Pure hypercholesterolemia, unspecified: Secondary | ICD-10-CM

## 2021-11-21 DIAGNOSIS — I10 Essential (primary) hypertension: Secondary | ICD-10-CM

## 2021-11-21 DIAGNOSIS — R0609 Other forms of dyspnea: Secondary | ICD-10-CM

## 2021-11-21 NOTE — Progress Notes (Signed)
Primary Care Provider: Cyndi Bender, [place] Cardiologist: Glenetta Hew, MD Electrophysiologist: None  Clinic Note: Chief Complaint  Patient presents with   Follow-up    4-monthfollow-up.   ===================================  ASSESSMENT/PLAN   Problem List Items Addressed This Visit       Cardiology Problems   Coronary artery disease involving native coronary artery without angina pectoris - Primary (Chronic)    History of nonocclusive CAD by heart catheterization with [place] multiple follow-up stress testing were negative for ischemia.  On low-dose aspirin, ARB and HCTZ.  Not currently on statin => noted statin intolerance in the past,      Essential hypertension (Chronic)    Blood pressure well controlled on current dose of losartan and HCTZ.      Hypercholesteremia (Chronic)    Still has elevated elevated LDH at 125.  She has been reluctant to try any medications such as statins.  At this point, normal evaluations today, she asked if she could simply to stay off medications with the knowledge that poorly controlled lipids are indeed cardiovascular is factors.        Other   DOE (dyspnea on exertion) (Chronic)    Evaluated last year with a CPX and she actually had excellent exercise tolerance.  Probably just a symptom not true abnormal etiology.       ===================================  HPI:    Belinda Brown a 87y.o. female with a PMH below who presents today for delayed annual f/u.  Doing well. Initial consultation was at the request of DEstil Daft PA for evaluation of dyspnea on exertion as well as right arm or shoulder pain.  Belinda Brown last seen on May 17, 2020 for fatigue exertional dyspnea and atypical chest pain.  Evaluate with CPX.  Had previously had heart catheterizations and nuclear echo stress test.  I chose to do a physiologic study-CPX.  Recent Hospitalizations:  01/14/2021-lumbar radiculopathy with  laminectomy and foraminectomy.  (Noted spinal stenosis with left leg radiculopathy) 07/12/2021 -> ER visit for chest pain = ended up with Lap Chole  Reviewed  CV studies:    The following studies were reviewed today: (if available, images/films reviewed: From Epic Chart or Care Everywhere) CPX 06/15/2020: Normal preexercise spirometry.  Normal functional capacity-normal heart rate/BP response with no EKG changes..  Normal peak VO2 of 15.8.  No evidence of significant CP abnormality. ->  Excellent gas exchange with supranormal functional capacity compared to max norms.  Interval History:   Belinda SOHMreturns here today for delayed follow-up.  She says that ever since her lap chole back in May she has had just a lot of fatigue with reduced energy.  Limited mobility because of back pain.  Because of lack of mobility, difficult to assess if she is having any exertional dyspnea.  Mild occasional twinging chest discomfort but nothing significant.  Nothing to suggest any arrhythmias.  CV Review of Symptoms (Summary):  positive for - dyspnea on exertion and this is mostly related to deconditioning and poor energy. negative for - chest pain, edema, irregular heartbeat, orthopnea, palpitations, paroxysmal nocturnal dyspnea, rapid heart rate, shortness of breath, or syncope or near syncope, TIA/amaurosis fugax, claudication  REVIEWED OF SYSTEMS   Review of Systems  Constitutional:  Positive for malaise/fatigue (Somewhat deconditioned.  Low energy level since surgery back in May.  [name]). Negative for weight loss.  HENT:  Negative for congestion.   Respiratory:  Positive for shortness of breath (Just with exertion due to deconditioning.). Negative for cough.   Gastrointestinal:  Negative for blood in stool and melena.  Genitourinary:  Negative for hematuria.  Musculoskeletal:  Positive for back pain and joint pain.       Left leg still bothers her.  Neurological:   Positive for dizziness and tingling (Radiculopathy). Negative for loss of consciousness and weakness.  Psychiatric/Behavioral:  Negative for depression and memory loss. The patient is not nervous/anxious and does not have insomnia.    I have reviewed and (if needed) personally updated the patient's problem list, medications, allergies, past medical and surgical history, social and family history.   PAST MEDICAL HISTORY   Past Medical History:  Diagnosis Date   Arthritis    back & L thumb   Degenerative joint disease (DJD) of lumbar spine 12/2017   Had lumbar decompression and fusion (L4-L5). ->  Did note some improvement like her back pain but still struggling with back pain and fatigue.  Not back to her previous baseline.   Hypercholesteremia    Hypertension    Nonobstructive atherosclerosis of coronary artery 2008   Nonobstructive disease by cath   OAB (overactive bladder)    Mostly at night.  She takes oxybutynin but no significant improvement.   Obstructive sleep apnea, moderate: ASI of 25 and 22 on home sleep study. 10/08/2018   Has tried 2 different masks-could not keep him on because he feels restrictive.   Thymoma 11/24/2005   s/p resection of thymic mass    PAST SURGICAL HISTORY   Past Surgical History:  Procedure Laterality Date   CARDIAC CATHETERIZATION  2008   [facility]. - Dr. Geraldo Pitter -- non-obstructive CAD (by pt report - ~50% single vessel)   CARDIOPULMONARY STRESS TEST  06/2020   Normal preexercise spirometry.  Normal functional capacity-normal heart rate/BP response with no EKG changes..  Normal peak VO2 of 15.8.  No evidence of significant CP abnormality. ->  Excellent gas exchange with supranormal functional capacity compared to max norms.   CERVICAL LAMINECTOMY  1986   CHOLECYSTECTOMY N/A 07/13/2021   Procedure: LAPAROSCOPIC CHOLECYSTECTOMY;  Surgeon: Coralie Keens, MD;  Location: [facility];  Service: General;  Laterality: N/A;   EYE SURGERY      cataracts removed- bilateral    LUMBAR LAMINECTOMY/DECOMPRESSION MICRODISCECTOMY Left 01/14/2021   Procedure: Laminectomy and Foraminotomy - left - - Lumbar two-Lumbar three;  Surgeon: Earnie Larsson, MD;  Location: [facility];  Service: Neurosurgery;  Laterality: Left;   NM MYOVIEW LTD  06/23/2016   EF 30-35% with severe hypokinesis of the distal septal, distal inferior, mid/distal anterior, mid/distal anterolateral walls and Akinesis of the apex. The cavity size was normal. Wall thickness was normal. Trivial Aortic regurgitation. Mitral Mild MR   partial mediastinoscopy with thymectomy and resection of thymic mass  11/24/2005   [facility]   TRANSTHORACIC ECHOCARDIOGRAM  06/19/2016   Normal LV size and function. EF 55-60%. GR 1 DD. No regional wall motion abnormalities. Mild (grade 1) DD. Mild LVH and trace AI,MR,TR   Treadmill Stress Echo  2016   [facility]: report not avaialble.  Pt reports - Normal result   TUBAL LIGATION      Immunization History  Administered Date(s) Administered   Moderna Sars-Covid-2 Vaccination 03/05/2019, 04/09/2019    MEDICATIONS/ALLERGIES   Current Meds  Medication Sig   acetaminophen (TYLENOL) 500 MG tablet Take 1,000 mg by mouth every 6 (six) hours as needed for mild pain.   aspirin  EC 81 MG tablet Take 81 mg by mouth daily.   cholecalciferol (VITAMIN D) 1000 units tablet Take 1,000 Units by mouth daily.   gabapentin (NEURONTIN) 300 MG capsule Take 300 mg by mouth 2 (two) times daily.   hydrochlorothiazide (HYDRODIURIL) 25 MG tablet Take 1 tablet (25 mg total) by mouth daily.   losartan (COZAAR) 50 MG tablet Take 50 mg by mouth daily.   vitamin B-12 (CYANOCOBALAMIN) 500 MCG tablet Take 500 mcg by mouth daily.   vitamin C (ASCORBIC ACID) 500 MG tablet Take 500 mg by mouth daily.    Allergies  Allergen Reactions   Ampicillin Hives    Has patient had a PCN reaction causing immediate rash, facial/tongue/throat swelling, SOB or lightheadedness with hypotension:  Yes Has patient had a PCN reaction causing severe rash involving mucus membranes or skin necrosis: No Has patient had a PCN reaction that required hospitalization: No Has patient had a PCN reaction occurring within the last 10 years: No If all of the above answers are "NO", then may proceed with Cephalosporin use.    Codeine Other (See Comments)    hallucinations Other reaction(s): hives/confusion    SOCIAL HISTORY/FAMILY HISTORY   Reviewed in Epic:  Pertinent findings:  Social History   Tobacco Use   Smoking status: Never   Smokeless tobacco: Never  Substance Use Topics   Alcohol use: Yes    Comment: not on a regular basis   Drug use: No   Social History   Social History Narrative   Not on file    OBJCTIVE -PE, EKG, labs   Wt Readings from Last 3 Encounters:  11/21/21 134 lb 12.8 oz (61.1 kg)  07/12/21 135 lb (61.2 kg)  01/14/21 135 lb (61.2 kg)    Physical Exam: BP 120/88   Pulse 73   Ht '5\' 5"'$  (1.651 m)   Wt 134 lb 12.8 oz (61.1 kg)   LMP  (LMP Unknown)   SpO2 98%   BMI 22.43 kg/m  Physical Exam Vitals reviewed.  Constitutional:      General: She is not in acute distress.    Appearance: Normal appearance. She is normal weight. She is not ill-appearing or toxic-appearing.  HENT:     Head: Normocephalic and atraumatic.  Neck:     Vascular: No carotid bruit or JVD.  Cardiovascular:     Rate and Rhythm: Normal rate and regular rhythm. No extrasystoles are present.    Chest Wall: PMI is not displaced.     Pulses: Normal pulses.     Heart sounds: S1 normal and S2 normal. No murmur heard.    No friction rub. No S4 sounds.  Pulmonary:     Effort: Pulmonary effort is normal. No respiratory distress.     Breath sounds: Normal breath sounds. No stridor. No wheezing, rhonchi or rales.  Chest:     Chest wall: No tenderness.  Musculoskeletal:        General: No swelling. Normal range of motion.     Cervical back: Normal range of motion and neck supple.   Skin:    General: Skin is warm and dry.  Neurological:     General: No focal deficit present.     Mental Status: She is alert and oriented to person, place, and time. Mental status is at baseline.     Gait: Gait abnormal.  Psychiatric:        Mood and Affect: Mood normal.        Behavior: Behavior normal.  Thought Content: Thought content normal.        Judgment: Judgment normal.    Adult ECG Report Not checked  Recent Labs: Reviewed 02/15/2021: TC 200, TG 129, HDL 50, LDL 125.. 10/06/2021: A1c 5.3, Hgb 14.8,Cr 0.66, K+ 4.1, TSH 1.07. No results found for: "CHOL", "HDL", "LDLCALC", "LDLDIRECT", "TRIG", "CHOLHDL" Lab Results  Component Value Date   CREATININE 0.77 07/14/2021   BUN 16 07/14/2021   NA 134 (L) 07/14/2021   K 4.3 07/14/2021   CL 104 07/14/2021   CO2 24 07/14/2021      Latest Ref Rng & Units 07/14/2021    4:12 AM 07/13/2021    4:18 AM 07/12/2021    4:33 PM  CBC  WBC 4.0 - 10.5 K/uL 8.3  4.6  7.9   Hemoglobin 12.0 - 15.0 g/dL 10.7  12.9  14.7   Hematocrit 36.0 - 46.0 % 33.2  39.0  42.8   Platelets 150 - 400 K/uL 159  175  198     No results found for: "HGBA1C" No results found for: "TSH"  ================================================== I spent a total of 16 minutes with the patient spent in direct patient consultation.  Additional time spent with chart review  / charting (studies, outside notes, etc): 22 min Total Time: 38 min  Current medicines are reviewed at length with the patient today.  (+/- concerns) N/A  Notice: This dictation was prepared with Dragon dictation along with smart phrase technology. Any transcriptional errors that result from this process are unintentional and may not be corrected upon review.  Studies Ordered:   No orders of the defined types were placed in this encounter.  No orders of the defined types were placed in this encounter.   Patient Instructions / Medication Changes & Studies & Tests Ordered   Patient  Instructions  Medication Instructions:   No changes    Lab Work:  Not needed     Testing/Procedures:  Not needed  Follow-Up: At [facility], you and your health needs are our priority.  As part of our continuing mission to provide you with exceptional heart care, we have created designated Provider Care Teams.  These Care Teams include your primary Cardiologist (physician) and Advanced Practice Providers (APPs -  Physician Assistants and Nurse Practitioners) who all work together to provide you with the care you need, when you need it.  We recommend signing up for the patient portal called "MyChart".  Sign up information is provided on this After Visit Summary.  MyChart is used to connect with patients for Virtual Visits (Telemedicine).  Patients are able to view lab/test results, encounter notes, upcoming appointments, etc.  Non-urgent messages can be sent to your provider as well.   To learn more about what you can do with MyChart, go to [email or web address].    Your next appointment:   As needed  The format for your next appointment:   In Person  Provider:   Glenetta Hew, MD       Leonie Man, MD, MS Glenetta Hew, M.D., M.S. Interventional Cardiologist  [facility]  Pager # [phone] Phone # [phone] [address] [place], [place] [address]   Thank you for choosing [facility]!!

## 2021-11-21 NOTE — Patient Instructions (Signed)
Medication Instructions:   No changes    Lab Work:  Not needed     Testing/Procedures:  Not needed  Follow-Up: At [facility], you and your health needs are our priority.  As part of our continuing mission to provide you with exceptional heart care, we have created designated Provider Care Teams.  These Care Teams include your primary Cardiologist (physician) and Advanced Practice Providers (APPs -  Physician Assistants and Nurse Practitioners) who all work together to provide you with the care you need, when you need it.  We recommend signing up for the patient portal called "MyChart".  Sign up information is provided on this After Visit Summary.  MyChart is used to connect with patients for Virtual Visits (Telemedicine).  Patients are able to view lab/test results, encounter notes, upcoming appointments, etc.  Non-urgent messages can be sent to your provider as well.   To learn more about what you can do with MyChart, go to [email or web address].    Your next appointment:   As needed  The format for your next appointment:   In Person  Provider:   Glenetta Hew, MD

## 2021-11-23 DIAGNOSIS — M5416 Radiculopathy, lumbar region: Secondary | ICD-10-CM | POA: Diagnosis not present

## 2021-12-17 ENCOUNTER — Encounter: Payer: Self-pay | Admitting: Cardiology

## 2021-12-17 NOTE — Assessment & Plan Note (Signed)
Evaluated last year with a CPX and she actually had excellent exercise tolerance.  Probably just a symptom not true abnormal etiology.

## 2021-12-17 NOTE — Assessment & Plan Note (Signed)
History of nonocclusive CAD by heart catheterization with [place] multiple follow-up stress testing were negative for ischemia.  On low-dose aspirin, ARB and HCTZ.  Not currently on statin => noted statin intolerance in the past,

## 2021-12-17 NOTE — Assessment & Plan Note (Signed)
Blood pressure well controlled on current dose of losartan and HCTZ.

## 2021-12-17 NOTE — Assessment & Plan Note (Signed)
Still has elevated elevated LDH at 125.  She has been reluctant to try any medications such as statins.  At this point, normal evaluations today, she asked if she could simply to stay off medications with the knowledge that poorly controlled lipids are indeed cardiovascular is factors.

## 2021-12-19 ENCOUNTER — Ambulatory Visit: Payer: Medicare HMO | Admitting: Cardiology

## 2022-02-22 DIAGNOSIS — M5416 Radiculopathy, lumbar region: Secondary | ICD-10-CM | POA: Diagnosis not present

## 2022-03-09 DIAGNOSIS — M5416 Radiculopathy, lumbar region: Secondary | ICD-10-CM | POA: Diagnosis not present

## 2022-03-09 DIAGNOSIS — M5136 Other intervertebral disc degeneration, lumbar region: Secondary | ICD-10-CM | POA: Diagnosis not present

## 2022-03-09 DIAGNOSIS — M47816 Spondylosis without myelopathy or radiculopathy, lumbar region: Secondary | ICD-10-CM | POA: Diagnosis not present

## 2022-03-16 DIAGNOSIS — M5416 Radiculopathy, lumbar region: Secondary | ICD-10-CM | POA: Diagnosis not present

## 2022-03-30 DIAGNOSIS — M5416 Radiculopathy, lumbar region: Secondary | ICD-10-CM | POA: Diagnosis not present

## 2022-04-13 DIAGNOSIS — M40204 Unspecified kyphosis, thoracic region: Secondary | ICD-10-CM | POA: Diagnosis not present

## 2022-04-13 DIAGNOSIS — M5416 Radiculopathy, lumbar region: Secondary | ICD-10-CM | POA: Diagnosis not present

## 2022-04-21 DIAGNOSIS — M5416 Radiculopathy, lumbar region: Secondary | ICD-10-CM | POA: Diagnosis not present

## 2022-04-21 DIAGNOSIS — R7303 Prediabetes: Secondary | ICD-10-CM | POA: Diagnosis not present

## 2022-04-21 DIAGNOSIS — R350 Frequency of micturition: Secondary | ICD-10-CM | POA: Diagnosis not present

## 2022-04-21 DIAGNOSIS — M545 Low back pain, unspecified: Secondary | ICD-10-CM | POA: Diagnosis not present

## 2022-04-21 DIAGNOSIS — E559 Vitamin D deficiency, unspecified: Secondary | ICD-10-CM | POA: Diagnosis not present

## 2022-04-21 DIAGNOSIS — E785 Hyperlipidemia, unspecified: Secondary | ICD-10-CM | POA: Diagnosis not present

## 2022-04-21 DIAGNOSIS — B0229 Other postherpetic nervous system involvement: Secondary | ICD-10-CM | POA: Diagnosis not present

## 2022-04-21 DIAGNOSIS — I10 Essential (primary) hypertension: Secondary | ICD-10-CM | POA: Diagnosis not present

## 2022-04-21 DIAGNOSIS — E538 Deficiency of other specified B group vitamins: Secondary | ICD-10-CM | POA: Diagnosis not present

## 2022-04-25 DIAGNOSIS — R69 Illness, unspecified: Secondary | ICD-10-CM | POA: Diagnosis not present

## 2022-04-25 DIAGNOSIS — F4542 Pain disorder with related psychological factors: Secondary | ICD-10-CM | POA: Diagnosis not present

## 2022-05-08 DIAGNOSIS — D485 Neoplasm of uncertain behavior of skin: Secondary | ICD-10-CM | POA: Diagnosis not present

## 2022-05-30 DIAGNOSIS — M5416 Radiculopathy, lumbar region: Secondary | ICD-10-CM | POA: Diagnosis not present

## 2022-05-30 DIAGNOSIS — M961 Postlaminectomy syndrome, not elsewhere classified: Secondary | ICD-10-CM | POA: Diagnosis not present

## 2022-06-06 DIAGNOSIS — M961 Postlaminectomy syndrome, not elsewhere classified: Secondary | ICD-10-CM | POA: Diagnosis not present

## 2022-07-07 DIAGNOSIS — M961 Postlaminectomy syndrome, not elsewhere classified: Secondary | ICD-10-CM | POA: Diagnosis not present

## 2022-07-07 DIAGNOSIS — M5380 Other specified dorsopathies, site unspecified: Secondary | ICD-10-CM | POA: Diagnosis not present

## 2022-08-16 DIAGNOSIS — M961 Postlaminectomy syndrome, not elsewhere classified: Secondary | ICD-10-CM | POA: Diagnosis not present

## 2022-08-16 DIAGNOSIS — M5416 Radiculopathy, lumbar region: Secondary | ICD-10-CM | POA: Diagnosis not present

## 2022-09-21 DIAGNOSIS — B0229 Other postherpetic nervous system involvement: Secondary | ICD-10-CM | POA: Diagnosis not present

## 2022-09-21 DIAGNOSIS — I10 Essential (primary) hypertension: Secondary | ICD-10-CM | POA: Diagnosis not present

## 2022-09-21 DIAGNOSIS — R32 Unspecified urinary incontinence: Secondary | ICD-10-CM | POA: Diagnosis not present

## 2022-09-21 DIAGNOSIS — Z87892 Personal history of anaphylaxis: Secondary | ICD-10-CM | POA: Diagnosis not present

## 2022-09-21 DIAGNOSIS — Z833 Family history of diabetes mellitus: Secondary | ICD-10-CM | POA: Diagnosis not present

## 2022-09-21 DIAGNOSIS — Z809 Family history of malignant neoplasm, unspecified: Secondary | ICD-10-CM | POA: Diagnosis not present

## 2022-09-21 DIAGNOSIS — Z85828 Personal history of other malignant neoplasm of skin: Secondary | ICD-10-CM | POA: Diagnosis not present

## 2022-09-21 DIAGNOSIS — Z008 Encounter for other general examination: Secondary | ICD-10-CM | POA: Diagnosis not present

## 2022-09-21 DIAGNOSIS — G4733 Obstructive sleep apnea (adult) (pediatric): Secondary | ICD-10-CM | POA: Diagnosis not present

## 2022-09-21 DIAGNOSIS — Z7982 Long term (current) use of aspirin: Secondary | ICD-10-CM | POA: Diagnosis not present

## 2022-09-21 DIAGNOSIS — G629 Polyneuropathy, unspecified: Secondary | ICD-10-CM | POA: Diagnosis not present

## 2022-09-21 DIAGNOSIS — Z8249 Family history of ischemic heart disease and other diseases of the circulatory system: Secondary | ICD-10-CM | POA: Diagnosis not present

## 2022-09-21 DIAGNOSIS — Z885 Allergy status to narcotic agent status: Secondary | ICD-10-CM | POA: Diagnosis not present

## 2022-10-11 DIAGNOSIS — H524 Presbyopia: Secondary | ICD-10-CM | POA: Diagnosis not present

## 2022-10-30 DIAGNOSIS — B0229 Other postherpetic nervous system involvement: Secondary | ICD-10-CM | POA: Diagnosis not present

## 2022-10-30 DIAGNOSIS — I10 Essential (primary) hypertension: Secondary | ICD-10-CM | POA: Diagnosis not present

## 2022-10-30 DIAGNOSIS — Z9181 History of falling: Secondary | ICD-10-CM | POA: Diagnosis not present

## 2022-10-30 DIAGNOSIS — M545 Low back pain, unspecified: Secondary | ICD-10-CM | POA: Diagnosis not present

## 2022-10-30 DIAGNOSIS — E538 Deficiency of other specified B group vitamins: Secondary | ICD-10-CM | POA: Diagnosis not present

## 2022-10-30 DIAGNOSIS — Z139 Encounter for screening, unspecified: Secondary | ICD-10-CM | POA: Diagnosis not present

## 2022-10-30 DIAGNOSIS — R7303 Prediabetes: Secondary | ICD-10-CM | POA: Diagnosis not present

## 2022-10-30 DIAGNOSIS — E559 Vitamin D deficiency, unspecified: Secondary | ICD-10-CM | POA: Diagnosis not present

## 2022-10-30 DIAGNOSIS — M5416 Radiculopathy, lumbar region: Secondary | ICD-10-CM | POA: Diagnosis not present

## 2022-10-30 DIAGNOSIS — R8290 Unspecified abnormal findings in urine: Secondary | ICD-10-CM | POA: Diagnosis not present

## 2022-10-30 DIAGNOSIS — Z1331 Encounter for screening for depression: Secondary | ICD-10-CM | POA: Diagnosis not present

## 2022-10-30 DIAGNOSIS — E785 Hyperlipidemia, unspecified: Secondary | ICD-10-CM | POA: Diagnosis not present

## 2022-10-31 DIAGNOSIS — L821 Other seborrheic keratosis: Secondary | ICD-10-CM | POA: Diagnosis not present

## 2022-10-31 DIAGNOSIS — D225 Melanocytic nevi of trunk: Secondary | ICD-10-CM | POA: Diagnosis not present

## 2022-10-31 DIAGNOSIS — D2239 Melanocytic nevi of other parts of face: Secondary | ICD-10-CM | POA: Diagnosis not present

## 2022-10-31 DIAGNOSIS — L578 Other skin changes due to chronic exposure to nonionizing radiation: Secondary | ICD-10-CM | POA: Diagnosis not present

## 2023-04-04 DIAGNOSIS — N390 Urinary tract infection, site not specified: Secondary | ICD-10-CM | POA: Diagnosis not present

## 2023-04-18 DIAGNOSIS — N390 Urinary tract infection, site not specified: Secondary | ICD-10-CM | POA: Diagnosis not present

## 2023-04-30 DIAGNOSIS — M545 Low back pain, unspecified: Secondary | ICD-10-CM | POA: Diagnosis not present

## 2023-04-30 DIAGNOSIS — E559 Vitamin D deficiency, unspecified: Secondary | ICD-10-CM | POA: Diagnosis not present

## 2023-04-30 DIAGNOSIS — R7303 Prediabetes: Secondary | ICD-10-CM | POA: Diagnosis not present

## 2023-04-30 DIAGNOSIS — E538 Deficiency of other specified B group vitamins: Secondary | ICD-10-CM | POA: Diagnosis not present

## 2023-04-30 DIAGNOSIS — Z23 Encounter for immunization: Secondary | ICD-10-CM | POA: Diagnosis not present

## 2023-04-30 DIAGNOSIS — B0229 Other postherpetic nervous system involvement: Secondary | ICD-10-CM | POA: Diagnosis not present

## 2023-04-30 DIAGNOSIS — E785 Hyperlipidemia, unspecified: Secondary | ICD-10-CM | POA: Diagnosis not present

## 2023-04-30 DIAGNOSIS — M5416 Radiculopathy, lumbar region: Secondary | ICD-10-CM | POA: Diagnosis not present

## 2023-04-30 DIAGNOSIS — I10 Essential (primary) hypertension: Secondary | ICD-10-CM | POA: Diagnosis not present

## 2023-05-28 DIAGNOSIS — R051 Acute cough: Secondary | ICD-10-CM | POA: Diagnosis not present

## 2023-05-28 DIAGNOSIS — J209 Acute bronchitis, unspecified: Secondary | ICD-10-CM | POA: Diagnosis not present

## 2023-05-28 DIAGNOSIS — R0981 Nasal congestion: Secondary | ICD-10-CM | POA: Diagnosis not present

## 2023-06-04 DIAGNOSIS — Z008 Encounter for other general examination: Secondary | ICD-10-CM | POA: Diagnosis not present

## 2023-08-01 DIAGNOSIS — S30860A Insect bite (nonvenomous) of lower back and pelvis, initial encounter: Secondary | ICD-10-CM | POA: Diagnosis not present

## 2023-08-01 DIAGNOSIS — R21 Rash and other nonspecific skin eruption: Secondary | ICD-10-CM | POA: Diagnosis not present

## 2023-08-07 DIAGNOSIS — I471 Supraventricular tachycardia, unspecified: Secondary | ICD-10-CM | POA: Diagnosis not present

## 2023-08-07 DIAGNOSIS — M5416 Radiculopathy, lumbar region: Secondary | ICD-10-CM | POA: Diagnosis not present

## 2023-10-30 DIAGNOSIS — H524 Presbyopia: Secondary | ICD-10-CM | POA: Diagnosis not present

## 2023-10-31 DIAGNOSIS — L821 Other seborrheic keratosis: Secondary | ICD-10-CM | POA: Diagnosis not present

## 2023-10-31 DIAGNOSIS — L814 Other melanin hyperpigmentation: Secondary | ICD-10-CM | POA: Diagnosis not present

## 2023-10-31 DIAGNOSIS — D225 Melanocytic nevi of trunk: Secondary | ICD-10-CM | POA: Diagnosis not present

## 2023-10-31 DIAGNOSIS — D2239 Melanocytic nevi of other parts of face: Secondary | ICD-10-CM | POA: Diagnosis not present

## 2023-11-07 DIAGNOSIS — R768 Other specified abnormal immunological findings in serum: Secondary | ICD-10-CM | POA: Diagnosis not present

## 2023-11-07 DIAGNOSIS — M5416 Radiculopathy, lumbar region: Secondary | ICD-10-CM | POA: Diagnosis not present

## 2023-11-07 DIAGNOSIS — E559 Vitamin D deficiency, unspecified: Secondary | ICD-10-CM | POA: Diagnosis not present

## 2023-11-07 DIAGNOSIS — Z9181 History of falling: Secondary | ICD-10-CM | POA: Diagnosis not present

## 2023-11-07 DIAGNOSIS — M545 Low back pain, unspecified: Secondary | ICD-10-CM | POA: Diagnosis not present

## 2023-11-07 DIAGNOSIS — R7303 Prediabetes: Secondary | ICD-10-CM | POA: Diagnosis not present

## 2023-11-07 DIAGNOSIS — E538 Deficiency of other specified B group vitamins: Secondary | ICD-10-CM | POA: Diagnosis not present

## 2023-11-07 DIAGNOSIS — E785 Hyperlipidemia, unspecified: Secondary | ICD-10-CM | POA: Diagnosis not present

## 2023-11-07 DIAGNOSIS — R760 Raised antibody titer: Secondary | ICD-10-CM | POA: Diagnosis not present

## 2023-11-07 DIAGNOSIS — Z1331 Encounter for screening for depression: Secondary | ICD-10-CM | POA: Diagnosis not present

## 2023-11-07 DIAGNOSIS — R5383 Other fatigue: Secondary | ICD-10-CM | POA: Diagnosis not present

## 2023-11-07 DIAGNOSIS — M810 Age-related osteoporosis without current pathological fracture: Secondary | ICD-10-CM | POA: Diagnosis not present

## 2023-11-07 DIAGNOSIS — I10 Essential (primary) hypertension: Secondary | ICD-10-CM | POA: Diagnosis not present

## 2023-11-07 DIAGNOSIS — M25449 Effusion, unspecified hand: Secondary | ICD-10-CM | POA: Diagnosis not present

## 2023-11-07 DIAGNOSIS — B0229 Other postherpetic nervous system involvement: Secondary | ICD-10-CM | POA: Diagnosis not present

## 2023-11-07 DIAGNOSIS — R351 Nocturia: Secondary | ICD-10-CM | POA: Diagnosis not present

## 2023-12-24 DIAGNOSIS — M2540 Effusion, unspecified joint: Secondary | ICD-10-CM | POA: Diagnosis not present

## 2023-12-24 DIAGNOSIS — R7689 Other specified abnormal immunological findings in serum: Secondary | ICD-10-CM | POA: Diagnosis not present

## 2023-12-24 DIAGNOSIS — M79642 Pain in left hand: Secondary | ICD-10-CM | POA: Diagnosis not present

## 2023-12-24 DIAGNOSIS — M79641 Pain in right hand: Secondary | ICD-10-CM | POA: Diagnosis not present

## 2023-12-24 DIAGNOSIS — Z6822 Body mass index (BMI) 22.0-22.9, adult: Secondary | ICD-10-CM | POA: Diagnosis not present

## 2023-12-24 DIAGNOSIS — M2560 Stiffness of unspecified joint, not elsewhere classified: Secondary | ICD-10-CM | POA: Diagnosis not present

## 2024-01-21 DIAGNOSIS — M0609 Rheumatoid arthritis without rheumatoid factor, multiple sites: Secondary | ICD-10-CM | POA: Diagnosis not present

## 2024-01-21 DIAGNOSIS — Z6823 Body mass index (BMI) 23.0-23.9, adult: Secondary | ICD-10-CM | POA: Diagnosis not present

## 2024-01-21 DIAGNOSIS — M79642 Pain in left hand: Secondary | ICD-10-CM | POA: Diagnosis not present

## 2024-01-21 DIAGNOSIS — R7689 Other specified abnormal immunological findings in serum: Secondary | ICD-10-CM | POA: Diagnosis not present

## 2024-01-21 DIAGNOSIS — M79641 Pain in right hand: Secondary | ICD-10-CM | POA: Diagnosis not present
